# Patient Record
Sex: Female | Born: 1950 | Race: Black or African American | Hispanic: No | Marital: Married | Smoking: Never smoker
Health system: Southern US, Community
[De-identification: ages and names within clinical notes are randomized; demographics above are authoritative.]

## PROBLEM LIST (undated history)

## (undated) DIAGNOSIS — J90 Pleural effusion, not elsewhere classified: Secondary | ICD-10-CM

## (undated) DIAGNOSIS — D649 Anemia, unspecified: Secondary | ICD-10-CM

## (undated) DIAGNOSIS — I4891 Unspecified atrial fibrillation: Secondary | ICD-10-CM

## (undated) DIAGNOSIS — R532 Functional quadriplegia: Secondary | ICD-10-CM

## (undated) DIAGNOSIS — E0590 Thyrotoxicosis, unspecified without thyrotoxic crisis or storm: Secondary | ICD-10-CM

## (undated) DIAGNOSIS — I509 Heart failure, unspecified: Secondary | ICD-10-CM

## (undated) DIAGNOSIS — N289 Disorder of kidney and ureter, unspecified: Secondary | ICD-10-CM

## (undated) DIAGNOSIS — K219 Gastro-esophageal reflux disease without esophagitis: Secondary | ICD-10-CM

## (undated) DIAGNOSIS — Z9911 Dependence on respirator [ventilator] status: Secondary | ICD-10-CM

## (undated) DIAGNOSIS — I639 Cerebral infarction, unspecified: Secondary | ICD-10-CM

## (undated) DIAGNOSIS — R569 Unspecified convulsions: Secondary | ICD-10-CM

## (undated) HISTORY — PX: ABDOMINAL SURGERY: SHX537

---

## 2015-06-23 ENCOUNTER — Other Ambulatory Visit (HOSPITAL_COMMUNITY): Payer: Self-pay | Admitting: Interventional Radiology

## 2015-06-23 ENCOUNTER — Ambulatory Visit (HOSPITAL_COMMUNITY)
Admission: RE | Admit: 2015-06-23 | Discharge: 2015-06-23 | Disposition: A | Discharge status: Home / Self Care | Payer: Medicare Other | Source: Ambulatory Visit | Attending: Interventional Radiology | Admitting: Interventional Radiology

## 2015-06-23 DIAGNOSIS — R52 Pain, unspecified: Secondary | ICD-10-CM

## 2015-06-23 DIAGNOSIS — Z436 Encounter for attention to other artificial openings of urinary tract: Secondary | ICD-10-CM | POA: Insufficient documentation

## 2015-06-23 MED ORDER — IOHEXOL 300 MG/ML  SOLN
50.0000 mL | Freq: Once | INTRAMUSCULAR | Status: AC | PRN
  Administered 2015-06-23: 15 mL via INTRAVENOUS

## 2015-06-23 MED ORDER — LIDOCAINE HCL 1 % IJ SOLN
INTRAMUSCULAR | Status: DC
Start: 2015-06-23 — End: 2015-06-24
  Filled 2015-06-23: qty 20

## 2015-06-23 MED ORDER — MORPHINE SULFATE (PF) 4 MG/ML IV SOLN
2.0000 mg | Freq: Once | INTRAVENOUS | Status: AC
  Administered 2015-06-23: 2 mg via INTRAMUSCULAR

## 2015-06-23 MED ORDER — MORPHINE SULFATE (PF) 4 MG/ML IV SOLN
INTRAVENOUS | Status: AC
  Filled 2015-06-23: qty 1

## 2015-06-23 NOTE — Progress Notes (Signed)
rec'd trach/vent pt from [place] from [place].  Pt transported to [facility] for procedure in IR.  Pt placed on [place] vent settings reported from [place] via [facility].  SIMV/PS 450 vt, 12 ps, 5 peep, 100% fio2 (d/t pt in procedure area), and rate placed at 10 (d/t pt rec. Sedation for procedure).  Pt appears to be tol well, sat 99-100%, VSS.

## 2015-06-23 NOTE — Sedation Documentation (Signed)
Patient is resting comfortably. 

## 2015-06-23 NOTE — Progress Notes (Signed)
Pt transferred back to [facility] by [facility]. Pt awake and responding to simple questions. In no distress.

## 2015-06-23 NOTE — Procedures (Signed)
L PCN repalcement 10 Fr No comp/EBL

## 2015-06-23 NOTE — Sedation Documentation (Signed)
Received pt from [facility] accompanied by daughter. Pt is trached to vent. Awake and alert. Non verbal but nods yes and no to simple questions. Right sided Pleurex tube intact, with straw colored drainage. GT and foley intact. Pt with large sore to buttocks. CR monitor with A-fib rate 110-120's.

## 2015-06-29 ENCOUNTER — Encounter (HOSPITAL_COMMUNITY): Payer: Self-pay

## 2015-06-29 ENCOUNTER — Emergency Department (HOSPITAL_COMMUNITY)
Admission: EM | Admit: 2015-06-29 | Discharge: 2015-06-30 | Disposition: A | Discharge status: Home / Self Care | Payer: Medicare Other | Attending: Emergency Medicine | Admitting: Emergency Medicine

## 2015-06-29 DIAGNOSIS — T83511A Infection and inflammatory reaction due to indwelling urethral catheter, initial encounter: Secondary | ICD-10-CM

## 2015-06-29 DIAGNOSIS — Y732 Prosthetic and other implants, materials and accessory gastroenterology and urology devices associated with adverse incidents: Secondary | ICD-10-CM | POA: Insufficient documentation

## 2015-06-29 DIAGNOSIS — Z8639 Personal history of other endocrine, nutritional and metabolic disease: Secondary | ICD-10-CM | POA: Diagnosis not present

## 2015-06-29 DIAGNOSIS — Z792 Long term (current) use of antibiotics: Secondary | ICD-10-CM | POA: Diagnosis not present

## 2015-06-29 DIAGNOSIS — I509 Heart failure, unspecified: Secondary | ICD-10-CM | POA: Diagnosis not present

## 2015-06-29 DIAGNOSIS — Z8709 Personal history of other diseases of the respiratory system: Secondary | ICD-10-CM | POA: Insufficient documentation

## 2015-06-29 DIAGNOSIS — R4182 Altered mental status, unspecified: Secondary | ICD-10-CM | POA: Diagnosis present

## 2015-06-29 DIAGNOSIS — Z8673 Personal history of transient ischemic attack (TIA), and cerebral infarction without residual deficits: Secondary | ICD-10-CM | POA: Insufficient documentation

## 2015-06-29 DIAGNOSIS — T83098A Other mechanical complication of other indwelling urethral catheter, initial encounter: Secondary | ICD-10-CM | POA: Diagnosis not present

## 2015-06-29 DIAGNOSIS — Z862 Personal history of diseases of the blood and blood-forming organs and certain disorders involving the immune mechanism: Secondary | ICD-10-CM | POA: Insufficient documentation

## 2015-06-29 DIAGNOSIS — Z8719 Personal history of other diseases of the digestive system: Secondary | ICD-10-CM | POA: Insufficient documentation

## 2015-06-29 DIAGNOSIS — N390 Urinary tract infection, site not specified: Secondary | ICD-10-CM | POA: Diagnosis not present

## 2015-06-29 HISTORY — DX: Cerebral infarction, unspecified: I63.9

## 2015-06-29 HISTORY — DX: Unspecified atrial fibrillation: I48.91

## 2015-06-29 HISTORY — DX: Heart failure, unspecified: I50.9

## 2015-06-29 HISTORY — DX: Thyrotoxicosis, unspecified without thyrotoxic crisis or storm: E05.90

## 2015-06-29 HISTORY — DX: Unspecified convulsions: R56.9

## 2015-06-29 HISTORY — DX: Functional quadriplegia: R53.2

## 2015-06-29 HISTORY — DX: Dependence on respirator (ventilator) status: Z99.11

## 2015-06-29 HISTORY — DX: Pleural effusion, not elsewhere classified: J90

## 2015-06-29 HISTORY — DX: Disorder of kidney and ureter, unspecified: N28.9

## 2015-06-29 HISTORY — DX: Anemia, unspecified: D64.9

## 2015-06-29 HISTORY — DX: Gastro-esophageal reflux disease without esophagitis: K21.9

## 2015-06-29 NOTE — ED Provider Notes (Signed)
CSN: [id number]     Arrival date & time 06/29/15  2324 History  By signing my name below, I, Jacqueline Fuentes, attest that this documentation has been prepared under the direction and in the presence of Forde Dandy, MD. Electronically Signed: Julien Fuentes, ED Scribe. 06/29/2015. 11:46 PM.     Chief Complaint  Patient presents with  . Altered Mental Status   LEVEL 5 CAVEAT    The history is provided by the EMS personnel. No language interpreter was used.   HPI Comments: Jacqueline Fuentes is a 64 y.o. female who has a PMHx of chronic respiratory failure s/p tracheostomy and chronic ventilator dependence, chronic kidney disease, multiple prior CVAs with functional quadriplegia, Afib, pleural effusion w/ pleurX tube, chronic diastolic heart failure, HTN, non-operable intramural hematoma of ascending aorta, left nephrostomy tube for chronic left hydronephrosis, and chronic indwelling foley catheter, who  was brought in by EMS presents to the Emergency Department complaining of AMS. Per EMS personnel, pt was at [name] when they were called out for pt being unresponsive and her pressure being in the 80's. Upon EMS arrival, pt was responsive and her pressures were in the 180's. On paperwork, pt is full code as of June 22, 2015.   Further history is obtained from [facility], who states that patient was found to be staring off, and not responsive to verbal or tactile stimulation. The nurse had checked blood pressure once and it was noted to have a systolic blood pressure of 80. She subsequently called EMS, on their arrival, she was more hypertensive.  The patient's family who later comes to bedside state that she often has these staring spells especially after receiving Vicodin or benzodiazepines. They state that she has only been at this facility for one week. States that prior to this, she has had episodes of similar behavior, and they felt that she did not require to come to the emergency department  for this.   Past Medical History  Diagnosis Date  . Ventilator dependent ([place])   . Atrial fibrillation ([place])   . Seizures ([place])   . Pleural effusion   . Renal disorder   . Stroke ([place])   . Quadriplegia, functional ([place])   . CHF (congestive heart failure) ([place])   . Hyperthyroidism   . Anemia   . GERD (gastroesophageal reflux disease)    Past Surgical History  Procedure Laterality Date  . Abdominal surgery     No family history on file. Social History  Substance Use Topics  . Smoking status: Unknown If Ever Smoked  . Smokeless tobacco: None  . Alcohol Use: No   OB History    No data available     Review of Systems  All other systems reviewed and are negative. LEVEL 5 CAVEAT    Allergies  Aspirin; Heparin; and Lorazepam  Home Medications   Prior to Admission medications   Medication Sig Start Date End Date Taking? Authorizing Provider  ciprofloxacin (CIPRO) 500 MG tablet Take 1 tablet (500 mg total) by mouth every 12 (twelve) hours. 06/30/15   Forde Dandy, MD   Triage vitals: Pulse 110  Temp(Src) 98.5 F (36.9 C)  Resp 27  Ht 5\' 8"  (1.727 m)  Wt 275 lb (124.739 kg)  BMI 41.82 kg/m2  SpO2 100% Physical Exam Physical Exam  Nursing note and vitals reviewed. Constitutional: chronically ill appearing, in no acute distress Head: Normocephalic and atraumatic.  Mouth/Throat: Oropharynx is clear and moist.  Neck: Normal range of  motion. Neck supple. Tracheostomy in place Cardiovascular: Normal rate and regular rhythm.   Pulmonary/Chest: Effort normal and ventilator assisted breath sounds. right pleurX catheter in right axilla,  Abdominal: Soft. There is no tenderness. There is no rebound and no guarding. PEG tube in LUQ, left nephrostomy left flank,  GU: Foley catheter in place Musculoskeletal: No deformities Neurological: Awake, turns head to voice, squeezes hands bilaterally to command, wiggles toes bilaterally to command, non-verbal Skin: Skin is warm and  dry.  Psychiatric: Cooperative   ED Course  Procedures  DIAGNOSTIC STUDIES: Oxygen Saturation is 85% on RA, low by my interpretation..  Labs Review Labs Reviewed  CBC WITH DIFFERENTIAL/PLATELET - Abnormal; Notable for the following:    WBC 12.2 (*)    RBC 3.07 (*)    Hemoglobin 7.7 (*)    HCT 26.3 (*)    MCH 25.1 (*)    MCHC 29.3 (*)    RDW 16.6 (*)    Neutro Abs 10.2 (*)    All other components within normal limits  COMPREHENSIVE METABOLIC PANEL - Abnormal; Notable for the following:    Potassium 5.7 (*)    Chloride 91 (*)    CO2 39 (*)    Glucose, Bld 153 (*)    BUN 32 (*)    Albumin 2.2 (*)    Alkaline Phosphatase 136 (*)    GFR calc non Af Amer 60 (*)    All other components within normal limits  BRAIN NATRIURETIC PEPTIDE - Abnormal; Notable for the following:    B Natriuretic Peptide 402.9 (*)    All other components within normal limits  URINALYSIS, ROUTINE W REFLEX MICROSCOPIC (NOT AT [facility]) - Abnormal; Notable for the following:    APPearance TURBID (*)    Hgb urine dipstick LARGE (*)    Protein, ur 100 (*)    Leukocytes, UA LARGE (*)    All other components within normal limits  VALPROIC ACID LEVEL - Abnormal; Notable for the following:    Valproic Acid Lvl <10 (*)    All other components within normal limits  URINE MICROSCOPIC-ADD ON - Abnormal; Notable for the following:    Squamous Epithelial / LPF 0-5 (*)    Bacteria, UA MANY (*)    All other components within normal limits  URINE CULTURE  MAGNESIUM  PHOSPHORUS  I-STAT CG4 LACTIC ACID, ED  I-STAT TROPOININ, ED  I-STAT CG4 LACTIC ACID, ED    Imaging Review Ct Head Wo Contrast  06/30/2015  CLINICAL DATA:  64 year old female with altered mental status and loss of consciousness. EXAM: CT HEAD WITHOUT CONTRAST TECHNIQUE: Contiguous axial images were obtained from the base of the skull through the vertex without intravenous contrast. COMPARISON:  None. FINDINGS: The ventricles are dilated and the sulci  are prominent compatible with age-related atrophy. Periventricular and deep white matter hypodensities represent chronic microvascular ischemic changes. Bold right occipital infarct and encephalomalacia. There is a focal area of encephalomalacia involving the left parietal convexity compatible with an old infarct. Small bold Set S Set bilateral basal ganglia lacunar infarcts noted. There is no intracranial hemorrhage. No mass effect or midline shift identified. There is near complete opacification of the right mastoid air cells with partial opacification of the left mastoid air cells. There is mucoperiosteal thickening of the paranasal sinuses and partial opacification of the sphenoid sinuses. There is opacification of the nasopharynx. The calvarium is intact. IMPRESSION: No acute intracranial hemorrhage. Age-related atrophy and chronic microvascular ischemic disease. Old right cerebellar infarct. If  symptoms persist and there are no contraindications, MRI may provide better evaluation if clinically indicated. Electronically Signed   By: Anner Crete M.D.   On: 06/30/2015 02:01   Dg Chest Portable 1 View  06/30/2015  CLINICAL DATA:  64 year old female with loss of consciousness and altered mental status. EXAM: PORTABLE CHEST 1 VIEW COMPARISON:  None. FINDINGS: Stop tracheostomy tube with tip above the carina. Single portable view of the chest demonstrate enlarged cardiac silhouette with enlargement of the mediastinum. Although these findings may be partly related to patient positioning or paramediastinal pulmonary disease, underlying mediastinal or vascular injury is not excluded. Clinical correlation is recommended. CT may provide better evaluation if clinically indicated. There is a small right pleural effusion with mild compressive atelectasis of the right lung. The osseous structures are grossly unremarkable. IMPRESSION: Enlarged mediastinal silhouette may be partly related to patient positioning or  paramediastinal pulmonary opacities. Mediastinal or vascular injury is not excluded. Clinical correlation is recommended. Repeat radiograph with better positioning of the patient, if possible, or CT is recommended for further evaluation. Small right pleural effusion.  No pneumothorax. Electronically Signed   By: Anner Crete M.D.   On: 06/30/2015 00:56   I have personally reviewed and evaluated these images and lab results as part of my medical decision-making.   EKG Interpretation   Date/Time:  Thursday June 29 2015 23:35:09 EST Ventricular Rate:  116 PR Interval:  162 QRS Duration: 92 QT Interval:  327 QTC Calculation: 454 R Axis:   105 Text Interpretation:  Sinus tachycardia Probable left atrial enlargement  Right axis deviation no prior ekg for comparison Confirmed by Wilbur Oakland MD, Rudell Ortman  [id number]) on 06/30/2015 2:13:47 AM      MDM   Final diagnoses:  Urinary tract infection associated with catheterization of urinary tract, initial encounter    On arrival, she is chronically ill-appearing woman, who is currently at her reported mental status baseline. She is afebrile and hemodynamically stable. Remains normal to hypertensive while here in the emergency department without any evidence of hypotension. She has a normal lactate, mild leukocytosis of 12. A chest x-ray without acute cardiopulmonary processes, but she does have widened mediastinum that is consistent with her known thoracic aneurysm. She has anemia of 7.7, but when compared to prior blood work performed at [facility] earlier this year, her baseline ranges between 7.7-8. CT head shows no acute intracranial processes. No major electrolyte or metabolic derangements noted. UA suggestive of possible infection. Sent for culture and treated with ceftriaxone and course of keflex. Able to be treated at [facility] where she is presenting from, given that she is at her baseline according to family. Observed in the ED for several hours, with  stable mental status and vital signs. Family comfortable with plan to return to [facility]. Strict return and follow-up instructions reviewed. Family  expressed understanding of all discharge instructions and felt comfortable with the plan of care.    I personally performed the services described in this documentation, which was scribed in my presence. The recorded information has been reviewed and is accurate.   Forde Dandy, MD 06/30/15 [phone]

## 2015-06-29 NOTE — ED Notes (Signed)
Pt comes via [facility] from [place], called out for AMS, staff reported pt was unresponsive and pressures in 80's, upon EMS arrival pt was responsive and pressures 180's. Pt is a quadriplegic and on vent with trac.

## 2015-06-30 ENCOUNTER — Emergency Department (HOSPITAL_COMMUNITY): Payer: Medicare Other

## 2015-06-30 DIAGNOSIS — T83098A Other mechanical complication of other indwelling urethral catheter, initial encounter: Secondary | ICD-10-CM | POA: Diagnosis not present

## 2015-06-30 LAB — I-STAT CG4 LACTIC ACID, ED: Lactic Acid, Venous: 1 mmol/L (ref 0.5–2.0)

## 2015-06-30 LAB — CBC WITH DIFFERENTIAL/PLATELET
Basophils Absolute: 0 10*3/uL (ref 0.0–0.1)
Basophils Relative: 0 %
EOS ABS: 0 10*3/uL (ref 0.0–0.7)
EOS PCT: 0 %
HCT: 26.3 % — ABNORMAL LOW (ref 36.0–46.0)
HEMOGLOBIN: 7.7 g/dL — AB (ref 12.0–15.0)
LYMPHS ABS: 1 10*3/uL (ref 0.7–4.0)
Lymphocytes Relative: 8 %
MCH: 25.1 pg — AB (ref 26.0–34.0)
MCHC: 29.3 g/dL — AB (ref 30.0–36.0)
MCV: 85.7 fL (ref 78.0–100.0)
MONO ABS: 1 10*3/uL (ref 0.1–1.0)
MONOS PCT: 8 %
NEUTROS PCT: 84 %
Neutro Abs: 10.2 10*3/uL — ABNORMAL HIGH (ref 1.7–7.7)
Platelets: 190 10*3/uL (ref 150–400)
RBC: 3.07 MIL/uL — ABNORMAL LOW (ref 3.87–5.11)
RDW: 16.6 % — AB (ref 11.5–15.5)
WBC: 12.2 10*3/uL — ABNORMAL HIGH (ref 4.0–10.5)

## 2015-06-30 LAB — BRAIN NATRIURETIC PEPTIDE: B NATRIURETIC PEPTIDE 5: 402.9 pg/mL — AB (ref 0.0–100.0)

## 2015-06-30 LAB — URINALYSIS, ROUTINE W REFLEX MICROSCOPIC
Bilirubin Urine: NEGATIVE
Glucose, UA: NEGATIVE mg/dL
Ketones, ur: NEGATIVE mg/dL
Nitrite: NEGATIVE
Protein, ur: 100 mg/dL — AB
Specific Gravity, Urine: 1.016 (ref 1.005–1.030)
pH: 6 (ref 5.0–8.0)

## 2015-06-30 LAB — URINE MICROSCOPIC-ADD ON

## 2015-06-30 LAB — COMPREHENSIVE METABOLIC PANEL
ALT: 25 U/L (ref 14–54)
AST: 22 U/L (ref 15–41)
Albumin: 2.2 g/dL — ABNORMAL LOW (ref 3.5–5.0)
Alkaline Phosphatase: 136 U/L — ABNORMAL HIGH (ref 38–126)
Anion gap: 7 (ref 5–15)
BUN: 32 mg/dL — ABNORMAL HIGH (ref 6–20)
CO2: 39 mmol/L — ABNORMAL HIGH (ref 22–32)
Calcium: 9.1 mg/dL (ref 8.9–10.3)
Chloride: 91 mmol/L — ABNORMAL LOW (ref 101–111)
Creatinine, Ser: 0.97 mg/dL (ref 0.44–1.00)
GFR calc Af Amer: >60 mL/min (ref >60)
GFR calc non Af Amer: 60 mL/min — ABNORMAL LOW (ref >60)
Glucose, Bld: 153 mg/dL — ABNORMAL HIGH (ref 65–99)
Potassium: 5.7 mmol/L — ABNORMAL HIGH (ref 3.5–5.1)
Sodium: 137 mmol/L (ref 135–145)
Total Bilirubin: 0.4 mg/dL (ref 0.3–1.2)
Total Protein: 8 g/dL (ref 6.5–8.1)

## 2015-06-30 LAB — I-STAT TROPONIN, ED: Troponin i, poc: 0.01 ng/mL (ref 0.00–0.08)

## 2015-06-30 LAB — VALPROIC ACID LEVEL: Valproic Acid Lvl: <10 ug/mL — ABNORMAL LOW (ref 50.0–100.0)

## 2015-06-30 LAB — PHOSPHORUS: Phosphorus: 2.5 mg/dL (ref 2.5–4.6)

## 2015-06-30 LAB — MAGNESIUM: Magnesium: 2.4 mg/dL (ref 1.7–2.4)

## 2015-06-30 MED ORDER — CIPROFLOXACIN HCL 500 MG PO TABS: 500.0000 mg | ORAL_TABLET | Freq: Two times a day (BID) | ORAL | Status: DC

## 2015-06-30 MED ORDER — DEXTROSE 5 % IV SOLN
1.0000 g | Freq: Once | INTRAVENOUS | Status: AC
  Administered 2015-06-30: 1 g via INTRAVENOUS
  Filled 2015-06-30: qty 10

## 2015-06-30 NOTE — ED Notes (Signed)
Pt unable to sign e signature due to pt being quadriplegic

## 2015-06-30 NOTE — Progress Notes (Signed)
RT called to assist [facility] with preparing patient to be transported back to [facility]. Pt removed from our vent and placed on [facility] transport vent. Pt comfortable at time of departure.

## 2015-06-30 NOTE — Consult Note (Signed)
   Name: Jacqueline Fuentes MRN: [id number] DOB: 1951/01/25    Initially called to bedside to assess patient for possible need for admission. EGD provider speaking with family at bedside. Reportedly patient at baseline and family desires her transfer [facility] to [facility] for ongoing treatment for her urinary tract infection. Requested they reconsult if we could be of any further assistance in her care.  Sonia Baller Ashok Cordia, M.D. [facility] Pager:  [phone] After 3pm or if no response, call [phone] 06/30/2015, 4:21 AM

## 2015-06-30 NOTE — Discharge Instructions (Signed)
Return without fail for worsening symptoms, including fever, persistent confusion, or any other symptoms concerning to you.   Urinary Tract Infection Urinary tract infections (UTIs) can develop anywhere along your urinary tract. Your urinary tract is your body's drainage system for removing wastes and extra water. Your urinary tract includes two kidneys, two ureters, a bladder, and a urethra. Your kidneys are a pair of bean-shaped organs. Each kidney is about the size of your fist. They are located below your ribs, one on each side of your spine. CAUSES Infections are caused by microbes, which are microscopic organisms, including fungi, viruses, and bacteria. These organisms are so small that they can only be seen through a microscope. Bacteria are the microbes that most commonly cause UTIs. SYMPTOMS  Symptoms of UTIs may vary by age and gender of the patient and by the location of the infection. Symptoms in young women typically include a frequent and intense urge to urinate and a painful, burning feeling in the bladder or urethra during urination. Older women and men are more likely to be tired, shaky, and weak and have muscle aches and abdominal pain. A fever may mean the infection is in your kidneys. Other symptoms of a kidney infection include pain in your back or sides below the ribs, nausea, and vomiting. DIAGNOSIS To diagnose a UTI, your caregiver will ask you about your symptoms. Your caregiver will also ask you to provide a urine sample. The urine sample will be tested for bacteria and white blood cells. White blood cells are made by your body to help fight infection. TREATMENT  Typically, UTIs can be treated with medication. Because most UTIs are caused by a bacterial infection, they usually can be treated with the use of antibiotics. The choice of antibiotic and length of treatment depend on your symptoms and the type of bacteria causing your infection. HOME CARE INSTRUCTIONS  If you were  prescribed antibiotics, take them exactly as your caregiver instructs you. Finish the medication even if you feel better after you have only taken some of the medication.  Drink enough water and fluids to keep your urine clear or pale yellow.  Avoid caffeine, tea, and carbonated beverages. They tend to irritate your bladder.  Empty your bladder often. Avoid holding urine for long periods of time.  Empty your bladder before and after sexual intercourse.  After a bowel movement, women should cleanse from front to back. Use each tissue only once. SEEK MEDICAL CARE IF:   You have back pain.  You develop a fever.  Your symptoms do not begin to resolve within 3 days. SEEK IMMEDIATE MEDICAL CARE IF:   You have severe back pain or lower abdominal pain.  You develop chills.  You have nausea or vomiting.  You have continued burning or discomfort with urination. MAKE SURE YOU:   Understand these instructions.  Will watch your condition.  Will get help right away if you are not doing well or get worse.   This information is not intended to replace advice given to you by your health care provider. Make sure you discuss any questions you have with your health care provider.   Document Released: 04/03/2005 Document Revised: 03/15/2015 Document Reviewed: 08/02/2011 Elsevier Interactive Patient Education 2016 [place] Urinary Tract Infection FAQs What is "catheter-associated urinary tract infection"? A urinary tract infection (also called "UTI") is an infection in the urinary system, which includes the bladder (which stores the urine) and the kidneys (which filter the  blood to make urine). Germs (for example, bacteria or yeasts) do not normally live in these areas; but if germs are introduced, an infection can occur. If you have a urinary catheter, germs can travel along the catheter and cause an infection in your bladder or your kidney; in that case it is  called a catheter-associated urinary tract infection (or "CA-UTI").  What is a urinary catheter? A urinary catheter is a thin tube placed in the bladder to drain urine. Urine drains through the tube into a bag that collects the urine. A urinary catheter may be used:  If you are not able to urinate on your own  To measure the amount of urine that you make, for example, during intensive care  During and after some types of surgery  During some tests of the kidneys and bladder People with urinary catheters have a much higher chance of getting a urinary tract infection than people who don't have a catheter. How do I get a catheter-associated urinary tract infection (CA-UTI)? If germs enter the urinary tract, they may cause an infection. Many of the germs that cause a catheter-associated urinary tract infection are common germs found in your intestines that do not usually cause an infection there. Germs can enter the urinary tract when the catheter is being put in or while the catheter remains in the bladder.  What are the symptoms of a urinary tract infection? Some of the common symptoms of a urinary tract infection are:  Burning or pain in the lower abdomen (that is, below the stomach)  Fever  Bloody urine may be a sign of infection, but is also caused by other problems  Burning during urination or an increase in the frequency of urination after the catheter is removed. Sometimes people with catheter-associated urinary tract infections do not have these symptoms of infection. Can catheter-associated urinary tract infections be treated? Yes, most catheter-associated urinary tract infections can be treated with antibiotics and removal or change of the catheter. Your doctor will determine which antibiotic is best for you.  What are some of the things that hospitals are doing to prevent catheter-associated urinary tract infections? To prevent urinary tract infections, doctors and nurses take the  following actions.  Catheter insertion  External catheters in men (these look like condoms and are placed over the penis rather than into the penis)  Putting a temporary catheter in to drain the urine and removing it right away. This is called intermittent urethral catheterization. Catheter care What can I do to help prevent catheter-associated urinary tract infections if I have a catheter?  Always clean your hands before and after doing catheter care.  Always keep your urine bag below the level of your bladder.  Do not tug or pull on the tubing.  Do not twist or kink the catheter tubing.  Ask your healthcare provider each day if you still need the catheter. What do I need to do when I go home from the hospital?  If you will be going home with a catheter, your doctor or nurse should explain everything you need to know about taking care of the catheter. Make sure you understand how to care for it before you leave the hospital.  If you develop any of the symptoms of a urinary tract infection, such as burning or pain in the lower abdomen, fever, or an increase in the frequency of urination, contact your doctor or nurse immediately.  Before you go home, make sure you know who to contact  if you have questions or problems after you get home. If you have questions, please ask your doctor or nurse. Developed and co-sponsored by [employer] for [place] [phone]); Infectious Diseases Society of [place] (IDSA); [place]; Association for Professionals in Infection Control and Epidemiology (APIC); Centers for Disease Control and Prevention (CDC); and [employer].   This information is not intended to replace advice given to you by your health care provider. Make sure you discuss any questions you have with your health care provider.   Document Released: 03/18/2012 Document Revised: 11/08/2014 Document Reviewed: 09/07/2014 Elsevier Interactive  Patient Education [employer].

## 2015-06-30 NOTE — Progress Notes (Signed)
RT Note: Pt arrives via EMS from [place]. She is a chronic trach, vent dependent. Pt has a #7 Portex trach. Pt placed on our vent with [facility] settings. VT: 500, RR: 12, PEEP: 5, FIO2: 40%. Trach cleaned, trach ties changed. Pt deep suctioned with moderate, thick, yellow/green secretions removed. Pt vitals: HR: 99, RR: 23, SPO2: 100%. RT will continue to monitor.

## 2015-06-30 NOTE — Progress Notes (Signed)
Pt transported to CT without event. 

## 2015-07-04 ENCOUNTER — Telehealth (HOSPITAL_COMMUNITY): Payer: Self-pay

## 2015-07-04 LAB — URINE CULTURE

## 2015-07-04 NOTE — Telephone Encounter (Signed)
Lab calling w/critical.  Informed lab pt has been transported back to [place]

## 2015-07-05 ENCOUNTER — Telehealth (HOSPITAL_COMMUNITY): Payer: Self-pay

## 2015-08-11 ENCOUNTER — Ambulatory Visit (HOSPITAL_COMMUNITY)
Admission: RE | Admit: 2015-08-11 | Discharge: 2015-08-11 | Disposition: A | Discharge status: Home / Self Care | Payer: Medicare Other | Source: Ambulatory Visit | Attending: Internal Medicine | Admitting: Internal Medicine

## 2015-08-11 ENCOUNTER — Other Ambulatory Visit (HOSPITAL_COMMUNITY): Payer: Self-pay | Admitting: Internal Medicine

## 2015-08-11 DIAGNOSIS — I639 Cerebral infarction, unspecified: Secondary | ICD-10-CM

## 2015-08-11 DIAGNOSIS — I878 Other specified disorders of veins: Secondary | ICD-10-CM | POA: Insufficient documentation

## 2015-08-11 MED ORDER — LIDOCAINE HCL 1 % IJ SOLN
INTRAMUSCULAR | Status: AC
  Filled 2015-08-11: qty 20

## 2015-08-11 NOTE — Procedures (Signed)
Successful placement of dual lumen PICC line to right brachial vein. Length 37 cm Tip at lower SVC/RA No complications Ready for use.  Ascencion Dike PA-C 3:33 PM

## 2015-08-11 NOTE — Progress Notes (Signed)
RT Note: Pt brought from [facility] & placed on our vent for IR procedure. Pt tolerating current vent settings well. RT will continue to monitor.

## 2015-09-20 ENCOUNTER — Emergency Department (HOSPITAL_COMMUNITY): Payer: Medicare Other

## 2015-09-20 ENCOUNTER — Encounter (HOSPITAL_COMMUNITY): Payer: Self-pay | Admitting: *Deleted

## 2015-09-20 ENCOUNTER — Inpatient Hospital Stay (HOSPITAL_COMMUNITY)
Admission: EM | Admit: 2015-09-20 | Discharge: 2015-10-10 | DRG: 870 | Disposition: A | Discharge status: Skilled Nursing Facility | Payer: Medicare Other | Attending: Internal Medicine | Admitting: Internal Medicine

## 2015-09-20 DIAGNOSIS — K219 Gastro-esophageal reflux disease without esophagitis: Secondary | ICD-10-CM | POA: Diagnosis present

## 2015-09-20 DIAGNOSIS — J9610 Chronic respiratory failure, unspecified whether with hypoxia or hypercapnia: Secondary | ICD-10-CM | POA: Diagnosis present

## 2015-09-20 DIAGNOSIS — N179 Acute kidney failure, unspecified: Secondary | ICD-10-CM | POA: Diagnosis present

## 2015-09-20 DIAGNOSIS — L89154 Pressure ulcer of sacral region, stage 4: Secondary | ICD-10-CM | POA: Diagnosis present

## 2015-09-20 DIAGNOSIS — F411 Generalized anxiety disorder: Secondary | ICD-10-CM | POA: Diagnosis present

## 2015-09-20 DIAGNOSIS — L89619 Pressure ulcer of right heel, unspecified stage: Secondary | ICD-10-CM | POA: Diagnosis present

## 2015-09-20 DIAGNOSIS — D62 Acute posthemorrhagic anemia: Secondary | ICD-10-CM | POA: Diagnosis present

## 2015-09-20 DIAGNOSIS — A419 Sepsis, unspecified organism: Secondary | ICD-10-CM | POA: Diagnosis present

## 2015-09-20 DIAGNOSIS — G8929 Other chronic pain: Secondary | ICD-10-CM | POA: Diagnosis present

## 2015-09-20 DIAGNOSIS — J189 Pneumonia, unspecified organism: Secondary | ICD-10-CM

## 2015-09-20 DIAGNOSIS — J9690 Respiratory failure, unspecified, unspecified whether with hypoxia or hypercapnia: Secondary | ICD-10-CM

## 2015-09-20 DIAGNOSIS — I482 Chronic atrial fibrillation, unspecified: Secondary | ICD-10-CM | POA: Diagnosis present

## 2015-09-20 DIAGNOSIS — J90 Pleural effusion, not elsewhere classified: Secondary | ICD-10-CM | POA: Diagnosis present

## 2015-09-20 DIAGNOSIS — N1330 Unspecified hydronephrosis: Secondary | ICD-10-CM | POA: Diagnosis present

## 2015-09-20 DIAGNOSIS — E0590 Thyrotoxicosis, unspecified without thyrotoxic crisis or storm: Secondary | ICD-10-CM | POA: Diagnosis present

## 2015-09-20 DIAGNOSIS — L89891 Pressure ulcer of other site, stage 1: Secondary | ICD-10-CM | POA: Diagnosis present

## 2015-09-20 DIAGNOSIS — I5033 Acute on chronic diastolic (congestive) heart failure: Secondary | ICD-10-CM | POA: Diagnosis present

## 2015-09-20 DIAGNOSIS — Z8673 Personal history of transient ischemic attack (TIA), and cerebral infarction without residual deficits: Secondary | ICD-10-CM

## 2015-09-20 DIAGNOSIS — R579 Shock, unspecified: Secondary | ICD-10-CM | POA: Diagnosis present

## 2015-09-20 DIAGNOSIS — E870 Hyperosmolality and hypernatremia: Secondary | ICD-10-CM | POA: Diagnosis present

## 2015-09-20 DIAGNOSIS — Z886 Allergy status to analgesic agent status: Secondary | ICD-10-CM

## 2015-09-20 DIAGNOSIS — J15212 Pneumonia due to Methicillin resistant Staphylococcus aureus: Secondary | ICD-10-CM | POA: Diagnosis present

## 2015-09-20 DIAGNOSIS — I719 Aortic aneurysm of unspecified site, without rupture: Secondary | ICD-10-CM

## 2015-09-20 DIAGNOSIS — R569 Unspecified convulsions: Secondary | ICD-10-CM | POA: Diagnosis present

## 2015-09-20 DIAGNOSIS — I7101 Dissection of thoracic aorta: Secondary | ICD-10-CM | POA: Diagnosis present

## 2015-09-20 DIAGNOSIS — D638 Anemia in other chronic diseases classified elsewhere: Secondary | ICD-10-CM | POA: Diagnosis present

## 2015-09-20 DIAGNOSIS — Z9911 Dependence on respirator [ventilator] status: Secondary | ICD-10-CM | POA: Diagnosis present

## 2015-09-20 DIAGNOSIS — F419 Anxiety disorder, unspecified: Secondary | ICD-10-CM | POA: Diagnosis present

## 2015-09-20 DIAGNOSIS — F329 Major depressive disorder, single episode, unspecified: Secondary | ICD-10-CM | POA: Diagnosis present

## 2015-09-20 DIAGNOSIS — E43 Unspecified severe protein-calorie malnutrition: Secondary | ICD-10-CM | POA: Diagnosis present

## 2015-09-20 DIAGNOSIS — N189 Chronic kidney disease, unspecified: Secondary | ICD-10-CM | POA: Diagnosis present

## 2015-09-20 DIAGNOSIS — Z430 Encounter for attention to tracheostomy: Secondary | ICD-10-CM

## 2015-09-20 DIAGNOSIS — Z7401 Bed confinement status: Secondary | ICD-10-CM

## 2015-09-20 DIAGNOSIS — L8990 Pressure ulcer of unspecified site, unspecified stage: Secondary | ICD-10-CM | POA: Diagnosis present

## 2015-09-20 DIAGNOSIS — R319 Hematuria, unspecified: Secondary | ICD-10-CM | POA: Diagnosis present

## 2015-09-20 DIAGNOSIS — R532 Functional quadriplegia: Secondary | ICD-10-CM | POA: Diagnosis present

## 2015-09-20 DIAGNOSIS — D649 Anemia, unspecified: Secondary | ICD-10-CM

## 2015-09-20 DIAGNOSIS — Z888 Allergy status to other drugs, medicaments and biological substances status: Secondary | ICD-10-CM

## 2015-09-20 DIAGNOSIS — G40909 Epilepsy, unspecified, not intractable, without status epilepticus: Secondary | ICD-10-CM | POA: Diagnosis present

## 2015-09-20 DIAGNOSIS — J9611 Chronic respiratory failure with hypoxia: Secondary | ICD-10-CM | POA: Diagnosis present

## 2015-09-20 DIAGNOSIS — J9859 Other diseases of mediastinum, not elsewhere classified: Secondary | ICD-10-CM

## 2015-09-20 DIAGNOSIS — Z936 Other artificial openings of urinary tract status: Secondary | ICD-10-CM

## 2015-09-20 DIAGNOSIS — F32A Depression, unspecified: Secondary | ICD-10-CM | POA: Diagnosis present

## 2015-09-20 DIAGNOSIS — M869 Osteomyelitis, unspecified: Secondary | ICD-10-CM

## 2015-09-20 DIAGNOSIS — E669 Obesity, unspecified: Secondary | ICD-10-CM | POA: Diagnosis present

## 2015-09-20 DIAGNOSIS — Z6827 Body mass index (BMI) 27.0-27.9, adult: Secondary | ICD-10-CM

## 2015-09-20 DIAGNOSIS — E876 Hypokalemia: Secondary | ICD-10-CM | POA: Diagnosis present

## 2015-09-20 DIAGNOSIS — L89159 Pressure ulcer of sacral region, unspecified stage: Secondary | ICD-10-CM | POA: Diagnosis present

## 2015-09-20 DIAGNOSIS — R9389 Abnormal findings on diagnostic imaging of other specified body structures: Secondary | ICD-10-CM

## 2015-09-20 DIAGNOSIS — M8610 Other acute osteomyelitis, unspecified site: Secondary | ICD-10-CM

## 2015-09-20 DIAGNOSIS — Z930 Tracheostomy status: Secondary | ICD-10-CM

## 2015-09-20 DIAGNOSIS — Z931 Gastrostomy status: Secondary | ICD-10-CM

## 2015-09-20 DIAGNOSIS — M4626 Osteomyelitis of vertebra, lumbar region: Secondary | ICD-10-CM | POA: Diagnosis present

## 2015-09-20 DIAGNOSIS — I10 Essential (primary) hypertension: Secondary | ICD-10-CM | POA: Diagnosis present

## 2015-09-20 LAB — CBC WITH DIFFERENTIAL/PLATELET
BASOS ABS: 0 10*3/uL (ref 0.0–0.1)
BASOS PCT: 0 %
EOS ABS: 0 10*3/uL (ref 0.0–0.7)
EOS PCT: 0 %
HCT: 16.1 % — ABNORMAL LOW (ref 36.0–46.0)
Hemoglobin: 5 g/dL — CL (ref 12.0–15.0)
Lymphocytes Relative: 7 %
Lymphs Abs: 0.9 10*3/uL (ref 0.7–4.0)
MCH: 25.4 pg — ABNORMAL LOW (ref 26.0–34.0)
MCHC: 31.1 g/dL (ref 30.0–36.0)
MCV: 81.7 fL (ref 78.0–100.0)
MONO ABS: 0.6 10*3/uL (ref 0.1–1.0)
Monocytes Relative: 5 %
Neutro Abs: 11.1 10*3/uL — ABNORMAL HIGH (ref 1.7–7.7)
Neutrophils Relative %: 88 %
PLATELETS: 136 10*3/uL — AB (ref 150–400)
RBC: 1.97 MIL/uL — ABNORMAL LOW (ref 3.87–5.11)
RDW: 18.2 % — AB (ref 11.5–15.5)
WBC: 12.6 10*3/uL — AB (ref 4.0–10.5)

## 2015-09-20 LAB — COMPREHENSIVE METABOLIC PANEL
ALBUMIN: 1.2 g/dL — AB (ref 3.5–5.0)
ALK PHOS: 103 U/L (ref 38–126)
ALT: 18 U/L (ref 14–54)
AST: 28 U/L (ref 15–41)
Anion gap: 14 (ref 5–15)
BILIRUBIN TOTAL: 0.4 mg/dL (ref 0.3–1.2)
BUN: 238 mg/dL — AB (ref 6–20)
CALCIUM: 7.8 mg/dL — AB (ref 8.9–10.3)
CO2: 30 mmol/L (ref 22–32)
CREATININE: 4.63 mg/dL — AB (ref 0.44–1.00)
Chloride: 95 mmol/L — ABNORMAL LOW (ref 101–111)
GFR calc Af Amer: 11 mL/min — ABNORMAL LOW (ref >60)
GFR, EST NON AFRICAN AMERICAN: 9 mL/min — AB (ref >60)
GLUCOSE: 131 mg/dL — AB (ref 65–99)
Potassium: 4.1 mmol/L (ref 3.5–5.1)
Sodium: 139 mmol/L (ref 135–145)
TOTAL PROTEIN: 6.6 g/dL (ref 6.5–8.1)

## 2015-09-20 LAB — I-STAT VENOUS BLOOD GAS, ED
ACID-BASE EXCESS: 9 mmol/L — AB (ref 0.0–2.0)
Bicarbonate: 33.1 mEq/L — ABNORMAL HIGH (ref 20.0–24.0)
O2 SAT: 71 %
PCO2 VEN: 43 mmHg — AB (ref 45.0–50.0)
TCO2: 34 mmol/L (ref 0–100)
pH, Ven: 7.495 — ABNORMAL HIGH (ref 7.250–7.300)
pO2, Ven: 34 mmHg (ref 31.0–45.0)

## 2015-09-20 LAB — I-STAT TROPONIN, ED: TROPONIN I, POC: 0.04 ng/mL (ref 0.00–0.08)

## 2015-09-20 LAB — BRAIN NATRIURETIC PEPTIDE: B NATRIURETIC PEPTIDE 5: 249.6 pg/mL — AB (ref 0.0–100.0)

## 2015-09-20 LAB — LIPASE, BLOOD: LIPASE: 21 U/L (ref 11–51)

## 2015-09-20 LAB — I-STAT CG4 LACTIC ACID, ED: Lactic Acid, Venous: 1.95 mmol/L (ref 0.5–2.0)

## 2015-09-20 MED ORDER — ACETAMINOPHEN 500 MG PO TABS
1000.0000 mg | ORAL_TABLET | Freq: Once | ORAL | Status: AC
  Administered 2015-09-21: 1000 mg via ORAL
  Filled 2015-09-20: qty 2

## 2015-09-20 MED ORDER — SODIUM CHLORIDE 0.9 % IV SOLN: 10.0000 mL/h | Freq: Once | INTRAVENOUS | Status: DC

## 2015-09-20 MED ORDER — VANCOMYCIN HCL 10 G IV SOLR
2000.0000 mg | Freq: Once | INTRAVENOUS | Status: AC
  Administered 2015-09-21: 2000 mg via INTRAVENOUS
  Filled 2015-09-20: qty 2000

## 2015-09-20 MED ORDER — VANCOMYCIN HCL IN DEXTROSE 1-5 GM/200ML-% IV SOLN: 1000.0000 mg | Freq: Once | INTRAVENOUS | Status: DC

## 2015-09-20 MED ORDER — PIPERACILLIN-TAZOBACTAM 3.375 G IVPB 30 MIN
3.3750 g | Freq: Once | INTRAVENOUS | Status: AC
  Administered 2015-09-20: 3.375 g via INTRAVENOUS
  Filled 2015-09-20: qty 50

## 2015-09-20 MED ORDER — SODIUM CHLORIDE 0.9 % IV BOLUS (SEPSIS)
1000.0000 mL | Freq: Once | INTRAVENOUS | Status: AC
  Administered 2015-09-20: 1000 mL via INTRAVENOUS

## 2015-09-20 MED ORDER — IPRATROPIUM-ALBUTEROL 0.5-2.5 (3) MG/3ML IN SOLN
6.0000 mL | RESPIRATORY_TRACT | Status: DC
  Administered 2015-09-21: 3 mL via RESPIRATORY_TRACT
  Filled 2015-09-20: qty 3

## 2015-09-20 NOTE — ED Notes (Signed)
Pt to ED by [facility] from [place] [facility] for anemia and hypotension. Hgb 4.9 at [facility] with bp 58/44, received one unit of blood. Pt has several antigens for cross matching. Pt is vent dependent with chronic anemia. Husband requested transport. +4 BUE edema, pleura vac to R chest, nephrostomy tube to L kidney, hematuria noted in foley catheter bag

## 2015-09-20 NOTE — Progress Notes (Signed)
RT was unable to ventilate patient effectively due to high PIP. Placed patient on PCV and pt tol well. Will cont to monitor   09/20/15 2318  Vent Select  Invasive or Noninvasive Invasive  Adult Vent Y  Tracheostomy Portex 7 mm Cuffed  No Placement Date or Time found.   Inserted prior to hospital arrival?: Yes  Brand: Portex  Size (mm): 7 mm  Style: Cuffed  Status Secured  Site Assessment Clean;Dry  Ties Assessment Clean;Dry;Secure  Adult Ventilator Settings  Vent Type Servo i  Humidity HME  Vent Mode PCV  Set Rate 18 bmp  FiO2 (%) 40 %  I Time 0.9 Sec(s)  Pressure Control 25 cmH20  PEEP 5 cmH20  Adult Ventilator Measurements  Peak Airway Pressure 34 L/min  Mean Airway Pressure 16 cmH20  Plateau Pressure 22 cmH20  Resp Rate Spontaneous 6 br/min  Resp Rate Total 24 br/min  Exhaled Vt 425 mL  Measured Ve 11.9 mL  I:E Ratio Measured 1.1:1  SpO2 100 %  Adult Ventilator Alarms  Alarms On Y  Ve High Alarm 24 L/min  Ve Low Alarm 4 L/min  Resp Rate High Alarm 40 br/min  Resp Rate Low Alarm 5  PEEP Low Alarm 2 cmH2O  Press High Alarm 40 cmH2O

## 2015-09-20 NOTE — ED Provider Notes (Signed)
CSN: [id number]     Arrival date & time 09/20/15  2235 History  By signing my name below, I, Rayna Sexton, attest that this documentation has been prepared under the direction and in the presence of Everlene Balls, MD. Electronically Signed: Rayna Sexton, ED Scribe. 09/20/2015. 11:44 PM.    Chief Complaint  Patient presents with  . Anemia   The history is provided by the patient. No language interpreter was used.   LEVEL 5 Caveat: Acuity of Condition   HPI Comments: Jacqueline Fuentes is a 65 y.o. female with a PMHx of a-fib, pleural effusion, renal disorder, stroke and CHF who is ventilator dependant presents to the Emergency Department by [facility] from [facility]  due to hypotension. Per nurse, pt here due to chronic anemia noting her hemoglobin was <5 After being given 1unit of blood, patient became hypotensive.  She has a tough time with cross match for her blood.  They have also been treating her for a MRSA pneumonia.    Past Medical History  Diagnosis Date  . Ventilator dependent ([place])   . Atrial fibrillation ([place])   . Seizures ([place])   . Pleural effusion   . Renal disorder   . Stroke ([place])   . Quadriplegia, functional ([place])   . CHF (congestive heart failure) ([place])   . Hyperthyroidism   . Anemia   . GERD (gastroesophageal reflux disease)    Past Surgical History  Procedure Laterality Date  . Abdominal surgery     No family history on file. Social History  Substance Use Topics  . Smoking status: Unknown If Ever Smoked  . Smokeless tobacco: None  . Alcohol Use: No   OB History    No data available     Review of Systems  Unable to perform ROS: Acuity of condition   A complete 10 system review of systems was obtained and all systems are negative except as noted in the HPI and PMH.   Allergies  Aspirin; Heparin; and Lorazepam  Home Medications   Prior to Admission medications   Medication Sig Start Date End Date Taking? Authorizing Provider  ciprofloxacin  (CIPRO) 500 MG tablet Take 1 tablet (500 mg total) by mouth every 12 (twelve) hours. 06/30/15   Forde Dandy, MD   BP 103/58 mmHg  Pulse 89  Temp(Src) 100.7 F (38.2 C) (Oral)  Resp 28  SpO2 100%    Physical Exam  Constitutional: She is oriented to person, place, and time. She appears well-developed and well-nourished. No distress.  Trache in place; nonverbal; wheezing  HENT:  Head: Normocephalic and atraumatic.  Nose: Nose normal.  Mouth/Throat: Oropharynx is clear and moist. No oropharyngeal exudate.  Eyes: Conjunctivae and EOM are normal. Pupils are equal, round, and reactive to light. No scleral icterus.  Neck: Normal range of motion. Neck supple. No JVD present. No tracheal deviation present. No thyromegaly present.  Cardiovascular: Normal rate, regular rhythm and normal heart sounds.  Exam reveals no gallop and no friction rub.   No murmur heard. Pulmonary/Chest: Effort normal. No respiratory distress. She has wheezes. She exhibits no tenderness.  Abdominal: Soft. Bowel sounds are normal. She exhibits no distension and no mass. There is no tenderness. There is no rebound and no guarding.  PEG tube in LUQ of abd; foley and nephrostomy tubes present  Musculoskeletal: Normal range of motion. She exhibits edema. She exhibits no tenderness.  RUE picc line in place; 2+ bilateral LE edema present  Lymphadenopathy:    She  has no cervical adenopathy.  Neurological: She is alert and oriented to person, place, and time. No cranial nerve deficit. She exhibits normal muscle tone.  Skin: Skin is warm and dry. No rash noted. No erythema. No pallor.  Stage 4 decubitus ulcer at the sacrum, bone is exposed.  Nursing note and vitals reviewed.   ED Course  Procedures  DIAGNOSTIC STUDIES: Oxygen Saturation is 100% on RA, normal by my interpretation.    COORDINATION OF CARE: 11:14 PM Discussed next steps with pt and she agreed with the plan.   Labs Review Labs Reviewed  COMPREHENSIVE  METABOLIC PANEL - Abnormal; Notable for the following:    Chloride 95 (*)    Glucose, Bld 131 (*)    BUN 238 (*)    Creatinine, Ser 4.63 (*)    Calcium 7.8 (*)    Albumin 1.2 (*)    GFR calc non Af Amer 9 (*)    GFR calc Af Amer 11 (*)    All other components within normal limits  CBC WITH DIFFERENTIAL/PLATELET - Abnormal; Notable for the following:    WBC 12.6 (*)    RBC 1.97 (*)    Hemoglobin 5.0 (*)    HCT 16.1 (*)    MCH 25.4 (*)    RDW 18.2 (*)    Platelets 136 (*)    Neutro Abs 11.1 (*)    All other components within normal limits  I-STAT VENOUS BLOOD GAS, ED - Abnormal; Notable for the following:    pH, Ven 7.495 (*)    pCO2, Ven 43.0 (*)    Bicarbonate 33.1 (*)    Acid-Base Excess 9.0 (*)    All other components within normal limits  CULTURE, BLOOD (ROUTINE X 2)  CULTURE, BLOOD (ROUTINE X 2)  URINE CULTURE  LIPASE, BLOOD  URINALYSIS, ROUTINE W REFLEX MICROSCOPIC (NOT AT [facility])  BRAIN NATRIURETIC PEPTIDE  I-STAT CG4 LACTIC ACID, ED  I-STAT TROPOININ, ED  TYPE AND SCREEN  PREPARE RBC (CROSSMATCH)    Imaging Review Dg Chest Port 1 View  09/20/2015  CLINICAL DATA:  Acute onset of fever and shortness of breath. Assess for pneumonia. Low blood pressure. Initial encounter. EXAM: PORTABLE CHEST 1 VIEW COMPARISON:  Chest radiograph performed 06/30/2015 FINDINGS: The patient's tracheostomy tube is noted ending 7 cm above the carina. A right apical chest tube is unchanged in position. No definite pneumothorax is seen. A right PICC ends overlying the proximal SVC, though not well characterized due to patient rotation. The lungs are well-aerated. Vascular congestion is noted, with increased interstitial markings and bilateral opacities, which may reflect pulmonary edema or pneumonia. There is no evidence of pleural effusion or pneumothorax. The cardiomediastinal silhouette is mildly enlarged. No acute osseous abnormalities are seen. IMPRESSION: Vascular congestion and mild  cardiomegaly. Increased interstitial markings and bilateral opacities may reflect pulmonary edema or pneumonia. Evaluation is somewhat suboptimal due to patient rotation. Electronically Signed   By: Garald Balding M.D.   On: 09/20/2015 23:14   I have personally reviewed and evaluated these images and lab results as part of my medical decision-making.   EKG Interpretation   Date/Time:  Wednesday September 20 2015 22:57:46 EDT Ventricular Rate:  87 PR Interval:  150 QRS Duration: 87 QT Interval:  383 QTC Calculation: 461 R Axis:   29 Text Interpretation:  Sinus rhythm Low voltage, extremity and precordial  leads tachycardia now resolved Confirmed by Glynn Octave [phone])  on 09/20/2015 11:36:26 PM      MDM  Final diagnoses:  None    Patient presents to the ED for anemia.  She also has a fever here and a CXR positive for pna.  Per records, this is MRSA pneumonia.  Code sepsis called.  She is currently HD stable.  Will contact ICU for admission.  I spoke with ICU for admission and they have accepted.  Broad spectrum antibiotics have been given.     ,I personally performed the services described in this documentation, which was scribed in my presence. The recorded information has been reviewed and is accurate.     Everlene Balls, MD 09/21/15 1517

## 2015-09-21 ENCOUNTER — Inpatient Hospital Stay (HOSPITAL_COMMUNITY): Payer: Medicare Other

## 2015-09-21 DIAGNOSIS — L89619 Pressure ulcer of right heel, unspecified stage: Secondary | ICD-10-CM | POA: Diagnosis present

## 2015-09-21 DIAGNOSIS — J189 Pneumonia, unspecified organism: Secondary | ICD-10-CM | POA: Diagnosis not present

## 2015-09-21 DIAGNOSIS — J9610 Chronic respiratory failure, unspecified whether with hypoxia or hypercapnia: Secondary | ICD-10-CM | POA: Diagnosis present

## 2015-09-21 DIAGNOSIS — E43 Unspecified severe protein-calorie malnutrition: Secondary | ICD-10-CM | POA: Diagnosis present

## 2015-09-21 DIAGNOSIS — R579 Shock, unspecified: Secondary | ICD-10-CM | POA: Diagnosis present

## 2015-09-21 DIAGNOSIS — D638 Anemia in other chronic diseases classified elsewhere: Secondary | ICD-10-CM | POA: Diagnosis present

## 2015-09-21 DIAGNOSIS — N1330 Unspecified hydronephrosis: Secondary | ICD-10-CM | POA: Diagnosis present

## 2015-09-21 DIAGNOSIS — F329 Major depressive disorder, single episode, unspecified: Secondary | ICD-10-CM | POA: Diagnosis present

## 2015-09-21 DIAGNOSIS — E870 Hyperosmolality and hypernatremia: Secondary | ICD-10-CM | POA: Diagnosis present

## 2015-09-21 DIAGNOSIS — I5033 Acute on chronic diastolic (congestive) heart failure: Secondary | ICD-10-CM | POA: Diagnosis present

## 2015-09-21 DIAGNOSIS — N179 Acute kidney failure, unspecified: Secondary | ICD-10-CM

## 2015-09-21 DIAGNOSIS — I482 Chronic atrial fibrillation: Secondary | ICD-10-CM | POA: Diagnosis present

## 2015-09-21 DIAGNOSIS — J15212 Pneumonia due to Methicillin resistant Staphylococcus aureus: Secondary | ICD-10-CM | POA: Diagnosis present

## 2015-09-21 DIAGNOSIS — R6521 Severe sepsis with septic shock: Secondary | ICD-10-CM | POA: Diagnosis not present

## 2015-09-21 DIAGNOSIS — D62 Acute posthemorrhagic anemia: Secondary | ICD-10-CM | POA: Diagnosis present

## 2015-09-21 DIAGNOSIS — A419 Sepsis, unspecified organism: Secondary | ICD-10-CM | POA: Diagnosis not present

## 2015-09-21 DIAGNOSIS — L8990 Pressure ulcer of unspecified site, unspecified stage: Secondary | ICD-10-CM | POA: Diagnosis present

## 2015-09-21 DIAGNOSIS — E669 Obesity, unspecified: Secondary | ICD-10-CM | POA: Diagnosis present

## 2015-09-21 DIAGNOSIS — K219 Gastro-esophageal reflux disease without esophagitis: Secondary | ICD-10-CM | POA: Diagnosis present

## 2015-09-21 DIAGNOSIS — I7101 Dissection of thoracic aorta: Secondary | ICD-10-CM | POA: Diagnosis present

## 2015-09-21 DIAGNOSIS — R532 Functional quadriplegia: Secondary | ICD-10-CM | POA: Diagnosis present

## 2015-09-21 DIAGNOSIS — G8929 Other chronic pain: Secondary | ICD-10-CM | POA: Diagnosis present

## 2015-09-21 DIAGNOSIS — L89154 Pressure ulcer of sacral region, stage 4: Secondary | ICD-10-CM | POA: Diagnosis present

## 2015-09-21 DIAGNOSIS — Z9911 Dependence on respirator [ventilator] status: Secondary | ICD-10-CM | POA: Diagnosis not present

## 2015-09-21 DIAGNOSIS — E0590 Thyrotoxicosis, unspecified without thyrotoxic crisis or storm: Secondary | ICD-10-CM | POA: Diagnosis present

## 2015-09-21 DIAGNOSIS — G40909 Epilepsy, unspecified, not intractable, without status epilepticus: Secondary | ICD-10-CM | POA: Diagnosis present

## 2015-09-21 DIAGNOSIS — F419 Anxiety disorder, unspecified: Secondary | ICD-10-CM | POA: Diagnosis present

## 2015-09-21 LAB — URINALYSIS, ROUTINE W REFLEX MICROSCOPIC
GLUCOSE, UA: NEGATIVE mg/dL
Ketones, ur: 15 mg/dL — AB
Nitrite: POSITIVE — AB
SPECIFIC GRAVITY, URINE: 1.02 (ref 1.005–1.030)
pH: 6.5 (ref 5.0–8.0)

## 2015-09-21 LAB — BASIC METABOLIC PANEL
Anion gap: 11 (ref 5–15)
BUN: 181 mg/dL — ABNORMAL HIGH (ref 6–20)
CALCIUM: 6.1 mg/dL — AB (ref 8.9–10.3)
CHLORIDE: 107 mmol/L (ref 101–111)
CO2: 26 mmol/L (ref 22–32)
CREATININE: 3.69 mg/dL — AB (ref 0.44–1.00)
GFR calc Af Amer: 14 mL/min — ABNORMAL LOW (ref >60)
GFR calc non Af Amer: 12 mL/min — ABNORMAL LOW (ref >60)
GLUCOSE: 117 mg/dL — AB (ref 65–99)
Potassium: 2.9 mmol/L — ABNORMAL LOW (ref 3.5–5.1)
Sodium: 144 mmol/L (ref 135–145)

## 2015-09-21 LAB — CBC
HEMATOCRIT: 12 % — AB (ref 36.0–46.0)
Hemoglobin: 3.8 g/dL — CL (ref 12.0–15.0)
MCH: 26 pg (ref 26.0–34.0)
MCHC: 31.7 g/dL (ref 30.0–36.0)
MCV: 82.2 fL (ref 78.0–100.0)
Platelets: 94 10*3/uL — ABNORMAL LOW (ref 150–400)
RBC: 1.46 MIL/uL — ABNORMAL LOW (ref 3.87–5.11)
RDW: 18.5 % — AB (ref 11.5–15.5)
WBC: 9.6 10*3/uL (ref 4.0–10.5)

## 2015-09-21 LAB — PROTIME-INR
INR: 1.59 — ABNORMAL HIGH (ref 0.00–1.49)
Prothrombin Time: 19 seconds — ABNORMAL HIGH (ref 11.6–15.2)

## 2015-09-21 LAB — VALPROIC ACID LEVEL: Valproic Acid Lvl: <10 ug/mL — ABNORMAL LOW (ref 50.0–100.0)

## 2015-09-21 LAB — RETICULOCYTES
RBC.: 1.84 MIL/uL — AB (ref 3.87–5.11)
RETIC CT PCT: 0.6 % (ref 0.4–3.1)
Retic Count, Absolute: 11 10*3/uL — ABNORMAL LOW (ref 19.0–186.0)

## 2015-09-21 LAB — I-STAT ARTERIAL BLOOD GAS, ED
ACID-BASE EXCESS: 8 mmol/L — AB (ref 0.0–2.0)
Bicarbonate: 33.4 mEq/L — ABNORMAL HIGH (ref 20.0–24.0)
O2 Saturation: 89 %
PCO2 ART: 51 mmHg — AB (ref 35.0–45.0)
PH ART: 7.426 (ref 7.350–7.450)
PO2 ART: 58 mmHg — AB (ref 80.0–100.0)
Patient temperature: 99.4
TCO2: 35 mmol/L (ref 0–100)

## 2015-09-21 LAB — IRON AND TIBC
Iron: 13 ug/dL — ABNORMAL LOW (ref 28–170)
SATURATION RATIOS: 8 % — AB (ref 10.4–31.8)
TIBC: 161 ug/dL — ABNORMAL LOW (ref 250–450)
UIBC: 148 ug/dL

## 2015-09-21 LAB — I-STAT CG4 LACTIC ACID, ED: Lactic Acid, Venous: 0.99 mmol/L (ref 0.5–2.0)

## 2015-09-21 LAB — MRSA PCR SCREENING: MRSA by PCR: NEGATIVE

## 2015-09-21 LAB — FOLATE: Folate: 48.9 ng/mL (ref >5.9)

## 2015-09-21 LAB — URINE MICROSCOPIC-ADD ON

## 2015-09-21 LAB — GLUCOSE, CAPILLARY
GLUCOSE-CAPILLARY: 96 mg/dL (ref 65–99)
Glucose-Capillary: 120 mg/dL — ABNORMAL HIGH (ref 65–99)
Glucose-Capillary: 112 mg/dL — ABNORMAL HIGH (ref 65–99)

## 2015-09-21 LAB — VITAMIN B12: VITAMIN B 12: 1163 pg/mL — AB (ref 180–914)

## 2015-09-21 LAB — PREPARE RBC (CROSSMATCH)

## 2015-09-21 LAB — FERRITIN: FERRITIN: 820 ng/mL — AB (ref 11–307)

## 2015-09-21 LAB — LACTATE DEHYDROGENASE: LDH: 156 U/L (ref 98–192)

## 2015-09-21 MED ORDER — PANTOPRAZOLE SODIUM 40 MG IV SOLR
40.0000 mg | Freq: Two times a day (BID) | INTRAVENOUS | Status: DC
  Administered 2015-09-21 – 2015-10-02 (×25): 40 mg via INTRAVENOUS
  Filled 2015-09-21 (×25): qty 40

## 2015-09-21 MED ORDER — POTASSIUM CHLORIDE 20 MEQ/15ML (10%) PO SOLN
40.0000 meq | Freq: Once | ORAL | Status: AC
  Administered 2015-09-21: 40 meq via ORAL
  Filled 2015-09-21: qty 30

## 2015-09-21 MED ORDER — SODIUM CHLORIDE 0.9 % IV SOLN
1.0000 g | Freq: Once | INTRAVENOUS | Status: AC
  Administered 2015-09-21: 1 g via INTRAVENOUS
  Filled 2015-09-21: qty 10

## 2015-09-21 MED ORDER — SODIUM CHLORIDE 0.9 % IV SOLN
500.0000 mg | Freq: Two times a day (BID) | INTRAVENOUS | Status: DC
  Administered 2015-09-21 – 2015-09-27 (×13): 500 mg via INTRAVENOUS
  Filled 2015-09-21 (×15): qty 0.5

## 2015-09-21 MED ORDER — ACETAMINOPHEN 160 MG/5ML PO SOLN
650.0000 mg | ORAL | Status: DC | PRN
  Administered 2015-09-21 – 2015-09-26 (×11): 650 mg
  Filled 2015-09-21 (×10): qty 20.3

## 2015-09-21 MED ORDER — POTASSIUM CHLORIDE CRYS ER 20 MEQ PO TBCR: 40.0000 meq | EXTENDED_RELEASE_TABLET | Freq: Once | ORAL | Status: DC

## 2015-09-21 MED ORDER — POTASSIUM CHLORIDE 20 MEQ/15ML (10%) PO SOLN
40.0000 meq | Freq: Once | ORAL | Status: AC
  Administered 2015-09-21: 40 meq
  Filled 2015-09-21: qty 30

## 2015-09-21 MED ORDER — DIPHENHYDRAMINE HCL 50 MG/ML IJ SOLN
12.5000 mg | Freq: Four times a day (QID) | INTRAMUSCULAR | Status: DC | PRN
  Administered 2015-09-21 – 2015-10-09 (×5): 12.5 mg via INTRAVENOUS
  Filled 2015-09-21 (×5): qty 1

## 2015-09-21 MED ORDER — VALPROATE SODIUM 250 MG/5ML PO SYRP
125.0000 mg | ORAL_SOLUTION | Freq: Two times a day (BID) | ORAL | Status: DC
  Administered 2015-09-21 – 2015-10-10 (×40): 125 mg
  Filled 2015-09-21 (×34): qty 5
  Filled 2015-09-21: qty 2.5
  Filled 2015-09-21 (×27): qty 5

## 2015-09-21 MED ORDER — ANTISEPTIC ORAL RINSE SOLUTION (CORINZ)
7.0000 mL | Freq: Four times a day (QID) | OROMUCOSAL | Status: DC
  Administered 2015-09-21 – 2015-10-10 (×71): 7 mL via OROMUCOSAL

## 2015-09-21 MED ORDER — DARBEPOETIN ALFA 60 MCG/0.3ML IJ SOSY
60.0000 ug | PREFILLED_SYRINGE | INTRAMUSCULAR | Status: DC
  Administered 2015-09-21: 60 ug via SUBCUTANEOUS
  Filled 2015-09-21 (×2): qty 0.3

## 2015-09-21 MED ORDER — FENTANYL CITRATE (PF) 100 MCG/2ML IJ SOLN: 50.0000 ug | INTRAMUSCULAR | Status: DC | PRN

## 2015-09-21 MED ORDER — SODIUM CHLORIDE 0.9 % IV SOLN
250.0000 mL | INTRAVENOUS | Status: DC | PRN
  Administered 2015-09-25: 250 mL via INTRAVENOUS

## 2015-09-21 MED ORDER — CHLORHEXIDINE GLUCONATE 0.12% ORAL RINSE (MEDLINE KIT)
15.0000 mL | Freq: Two times a day (BID) | OROMUCOSAL | Status: DC
  Administered 2015-09-21 – 2015-10-10 (×39): 15 mL via OROMUCOSAL

## 2015-09-21 MED ORDER — VANCOMYCIN HCL 10 G IV SOLR: 1750.0000 mg | INTRAVENOUS | Status: DC

## 2015-09-21 MED ORDER — FENTANYL CITRATE (PF) 100 MCG/2ML IJ SOLN
50.0000 ug | INTRAMUSCULAR | Status: DC | PRN
  Administered 2015-09-21 – 2015-10-07 (×28): 50 ug via INTRAVENOUS
  Filled 2015-09-21 (×31): qty 2

## 2015-09-21 MED ORDER — POTASSIUM CHLORIDE 10 MEQ/50ML IV SOLN: 10.0000 meq | INTRAVENOUS | Status: DC

## 2015-09-21 MED ORDER — COLLAGENASE 250 UNIT/GM EX OINT
TOPICAL_OINTMENT | Freq: Every day | CUTANEOUS | Status: DC
  Administered 2015-09-22 – 2015-10-01 (×8): via TOPICAL
  Administered 2015-10-02: 2 via TOPICAL
  Filled 2015-09-21 (×4): qty 30

## 2015-09-21 MED ORDER — IPRATROPIUM-ALBUTEROL 0.5-2.5 (3) MG/3ML IN SOLN: 3.0000 mL | RESPIRATORY_TRACT | Status: DC | PRN

## 2015-09-21 MED ORDER — SODIUM CHLORIDE 0.9% FLUSH: 3.0000 mL | INTRAVENOUS | Status: DC | PRN

## 2015-09-21 MED ORDER — SODIUM CHLORIDE 0.9% FLUSH
3.0000 mL | Freq: Two times a day (BID) | INTRAVENOUS | Status: DC
  Administered 2015-09-21: 10 mL via INTRAVENOUS
  Administered 2015-09-21 (×2): 3 mL via INTRAVENOUS
  Administered 2015-09-22: 10 mL via INTRAVENOUS
  Administered 2015-09-22 – 2015-09-27 (×7): 3 mL via INTRAVENOUS
  Administered 2015-09-27 – 2015-09-28 (×2): 10 mL via INTRAVENOUS
  Administered 2015-09-28 – 2015-09-29 (×3): 3 mL via INTRAVENOUS
  Administered 2015-09-30: 10 mL via INTRAVENOUS
  Administered 2015-09-30 – 2015-10-01 (×2): 3 mL via INTRAVENOUS
  Administered 2015-10-01 – 2015-10-02 (×2): 10 mL via INTRAVENOUS

## 2015-09-21 NOTE — Progress Notes (Signed)
[facility] Progress Note Patient Name: LAJEANA BEAUDRIE DOB: [birth year] MRN: [id number]   Date of Service  09/21/2015  HPI/Events of Note  Blood released by blood bank for transfusion Had questionable reaction to blood recently  eICU Interventions  Give tylenol benadryl     Intervention Category Intermediate Interventions: Bleeding - evaluation and treatment with blood products  Simonne Maffucci 09/21/2015, 4:03 PM

## 2015-09-21 NOTE — ED Notes (Signed)
Pleura vac noted to R chest with questionable placed. Sutures noted around tubing, not connected to skin

## 2015-09-21 NOTE — H&P (Signed)
PULMONARY / CRITICAL CARE MEDICINE   Name: JONIAH NAMETH MRN: [id number] DOB: 1950/08/19    ADMISSION DATE:  09/20/2015 CONSULTATION DATE:  09/20/2015  REFERRING MD:  EDP  CHIEF COMPLAINT:  Anemia, shock  HISTORY OF PRESENT ILLNESS:   65 year old female from [facility] who is trach/vent/PEG dependent at baseline. She has complicated past medical history as below, which includes seizure disorder, multiple CVAs, atrial fibrillation, chronic kidney disease, sacral decubitus, R sided pleural effusion with Pleur-X in place, diastolic CHF, and L hydronephrosis with chronic nephrostomy tube. She has also recently been treated for funguria with diflucan, pseudomonas PNA with ertapenem, and sacral wound has tested positive for MRSA. She was recently started on cefepime and vancomycin, for what is reportedly a MRSA PNA. She has also been requiring frequent blood transfusions for what is thought to be progression of her chronic anemia, however, she has multiple antibodies in her blood and is difficult to find transfusions for. [facility] has listed her as futility of care due to this anemia, however, husband has reportedly been very reluctant to address goals of care.   3/15 Mrs. Tineo developed hypotension and hemoglobin was noted to be 4.2. She was transfused one unit of blood, which was all that was available to [facility] at the time. Husband was contacted and requested a transfer to the emergency department so that she could receive further transfusions. In the emergency department she was noted to be febrile with leukocytosis. Hemoglobin was confirmed to be low at 5. Transfusions were ordered by ED staff, however, blood bank is unsure they will be able to accommodate this request due to her cross matching difficulties. Broad spectrum antibiotics were resumed.  PAST MEDICAL HISTORY :  She  has a past medical history of Ventilator dependent ([place]); Atrial fibrillation ([place]); Seizures ([place]); Pleural effusion; Renal  disorder; Stroke [facility]); Quadriplegia, functional ([place]); CHF (congestive heart failure) ([place]); Hyperthyroidism; Anemia; and GERD (gastroesophageal reflux disease).  PAST SURGICAL HISTORY: She  has past surgical history that includes Abdominal surgery.  Allergies  Allergen Reactions  . Aspirin   . Heparin   . Lorazepam     No current facility-administered medications on file prior to encounter.   Current Outpatient Prescriptions on File Prior to Encounter  Medication Sig  . ciprofloxacin (CIPRO) 500 MG tablet Take 1 tablet (500 mg total) by mouth every 12 (twelve) hours.    FAMILY HISTORY:  Her has no family status information on file.   SOCIAL HISTORY: She  reports that she does not drink alcohol.  REVIEW OF SYSTEMS:   unable  SUBJECTIVE:    VITAL SIGNS: BP 92/62 mmHg  Pulse 84  Temp(Src) 99.4 F (37.4 C) (Rectal)  Resp 24  SpO2 100%  HEMODYNAMICS:    VENTILATOR SETTINGS: Vent Mode:  [-] PCV FiO2 (%):  [40 %] 40 % Set Rate:  [18 bmp] 18 bmp Vt Set:  [450 mL] 450 mL PEEP:  [5 cmH20] 5 cmH20 Plateau Pressure:  [22 cmH20-30 cmH20] 22 cmH20  INTAKE / OUTPUT:    PHYSICAL EXAMINATION: General:  Obese, chronically ill appearing female on  Vent via trach Neuro:  Unresponsive, does not follow commands, facial grimace only to painful stimuli.  HEENT:  /AT, PERRL, Difficult to discern JVD due to neck girth Cardiovascular:  S1, S2 intact, no MRG Lungs:  Coarse bilateral breath sounds Abdomen:  Soft, non-distended Musculoskeletal:  +3 edema to BUE/ anasarca Skin:  Severe stage 4 sacral wound with exposed bone, 2 pressure wounds to  R foot.   LABS:  BMET  Recent Labs Lab 09/20/15 2306  NA 139  K 4.1  CL 95*  CO2 30  BUN 238*  CREATININE 4.63*  GLUCOSE 131*    Electrolytes  Recent Labs Lab 09/20/15 2306  CALCIUM 7.8*    CBC  Recent Labs Lab 09/20/15 2306  WBC 12.6*  HGB 5.0*  HCT 16.1*  PLT 136*    Coag's No results for input(s):  APTT, INR in the last 168 hours.  Sepsis Markers  Recent Labs Lab 09/20/15 2319  LATICACIDVEN 1.95    ABG No results for input(s): PHART, PCO2ART, PO2ART in the last 168 hours.  Liver Enzymes  Recent Labs Lab 09/20/15 2306  AST 28  ALT 18  ALKPHOS 103  BILITOT 0.4  ALBUMIN 1.2*    Cardiac Enzymes No results for input(s): TROPONINI, PROBNP in the last 168 hours.  Glucose No results for input(s): GLUCAP in the last 168 hours.  Imaging Dg Chest Port 1 View  09/20/2015  CLINICAL DATA:  Acute onset of fever and shortness of breath. Assess for pneumonia. Low blood pressure. Initial encounter. EXAM: PORTABLE CHEST 1 VIEW COMPARISON:  Chest radiograph performed 06/30/2015 FINDINGS: The patient's tracheostomy tube is noted ending 7 cm above the carina. A right apical chest tube is unchanged in position. No definite pneumothorax is seen. A right PICC ends overlying the proximal SVC, though not well characterized due to patient rotation. The lungs are well-aerated. Vascular congestion is noted, with increased interstitial markings and bilateral opacities, which may reflect pulmonary edema or pneumonia. There is no evidence of pleural effusion or pneumothorax. The cardiomediastinal silhouette is mildly enlarged. No acute osseous abnormalities are seen. IMPRESSION: Vascular congestion and mild cardiomegaly. Increased interstitial markings and bilateral opacities may reflect pulmonary edema or pneumonia. Evaluation is somewhat suboptimal due to patient rotation. Electronically Signed   By: Garald Balding M.D.   On: 09/20/2015 23:14    STUDIES:    CULTURES: BCx2 3/16 > Urine 3/16 > Tracheal aspirate 3/16 >  ANTIBIOTICS: Meropenem 3/16 > Vancomycin 3/16 >  SIGNIFICANT EVENTS: 3/16 transfer from [facility] to [facility] for anemia/hypotension  LINES/TUBES: Trach #7 cuffed >> RUE PICC 2/3 >>>  DISCUSSION: 65 year old unfortunate female. Vent/trach dependent from [facility]. Presented 3/15  with anemia and hypotension. Unable to transfuse effectively at [facility] due to multiple antibodies in blood. Could be just chronic anemia, but concern for GIB or hematureia  ASSESSMENT / PLAN:  PULMONARY A: Chronic respiratory failure with ventilator dependence Chronic R effusion with PleurX, appears dislodged from suture  P:   Full vent support ABG Vent bundle Assess PleurX  CARDIOVASCULAR A:  Hypotension secondary to ABLA Atrial Fibrillation Acute on chronic diastolic CHF  P:  Telemetry monitoring Transfuse PRBC Hold home antihypertensive medications (hydralazine, metoprolol) Trend lactic  RENAL A:   Acute on CKD - appears prerenal L hydronephrosis with nephrostomy tube Chronic indwelling foley catheter  P:   Follow BMP  Saline lock Not a dialysis candidate  GASTROINTESTINAL A:   GERD Possible GIB Severe protein calorie malnutrition   P:   NPO Protonix BID Resume TF if GIB ruled out  HEMATOLOGIC A:   Acute blood loss on anemia of chronic illness. Possibly from GIB, hematuria  P:  Transfuse 2 units PRBC Serial CBC Assess coags, LDH, haptoglobin.  INFECTIOUS A:   Possible sepsis, source unclear consider PNA, UTI, Sacral wound, or any number of chronic indwelling lines/tubes.  P:   ABX as above (  mero, vanc) Trend WBC and fever curves PCT  ENDOCRINE  A: No acute issues  P:   Follow glucose on chemistry  NEUROLOGIC A:   Seizure disorder Anxiety/depression Chronic pain H/o several CVA P:   RASS goal: 0 PRN fentanyl Continue valproic acid, assess level  Holding lexapro, trazodone    FAMILY  - Updates: unable to contact family in ED  - Inter-disciplinary family meet or Palliative Care meeting due by:  3/23   Georgann Housekeeper, AGACNP-BC [place] Pulmonology/Critical Care Pager [phone] or [phone]  09/21/2015 1:26 AM

## 2015-09-21 NOTE — ED Notes (Signed)
Waiting for vanc from pharmacy 

## 2015-09-21 NOTE — Progress Notes (Addendum)
[facility] Progress Note Patient Name: Jacqueline Fuentes DOB: 1950-10-15 MRN: [id number]   Date of Service  09/21/2015  HPI/Events of Note  K=2.9 Ca=6.1  eICU Interventions  Replaced      Intervention Category Major Interventions: Electrolyte abnormality - evaluation and management  Gad Aymond 09/21/2015, 6:10 AM

## 2015-09-21 NOTE — Progress Notes (Signed)
CRITICAL VALUE ALERT  Critical value received:  Hg 3.8  Date of notification:  09/21/2015  Time of notification:  0606  Critical value read back:Yes.    Nurse who received alert:  Reynolds Bowl RN  MD notified (1st page):  PCCM  Time of first page:  0606  MD notified (2nd page):  Time of second page:  Responding MD:  PCCM  Time MD responded:

## 2015-09-21 NOTE — Progress Notes (Signed)
Report rec'd from ED nurse.

## 2015-09-21 NOTE — Clinical Social Work Note (Signed)
Clinical Social Work Assessment  Patient Details  Name: Jacqueline Fuentes MRN: [id number] Date of Birth: 1951-01-21  Date of referral:  09/21/15               Reason for consult:  Other (Comment Required) (patient is from SNF: [facility])                Permission sought to share information with:  Case Freight forwarder, Customer service manager, Family Supports Permission granted to share information::  Yes, Verbal Permission Granted  Name::        Agency::  [facility]  Relationship::  Husband: Jacqueline Fuentes  Sport and exercise psychologist Information:     Housing/Transportation Living arrangements for the past 2 months:  [place] of Information:  Scientist, water quality, Facility, Spouse Patient Interpreter Needed:  None Criminal Activity/Legal Involvement Pertinent to Current Situation/Hospitalization:  No - Comment as needed Significant Relationships:  Adult Children, Spouse, Warehouse manager, Other Family Members Lives with:  Facility Resident Do you feel safe going back to the place where you live?  Yes (facility agreeable for pt return.) Need for family participation in patient care:  Yes (Comment) (due to medical condition trach/vent/PEG dependent at baseline)  Care giving concerns:  No concerns at this time. Facility contacted and reports husband very involved in care.  Attempting to help patient's family at facility with palliative care and [place], however family has not been ready to discuss [place].  Call placed to husband, however unable to reach.  Made CSW unit aware of call.   Social Worker assessment / plan:  LCSW completed FL2 and contacted facility regarding patient being admitted. Patient's family was contacted, but unable to reach.  Still attempting and will follow while in hospital.    Employment status:  Disabled (Comment on whether or not currently receiving Disability) Insurance information:  Medicare, Medicaid In [place] PT Recommendations:  [place] / Referral to  community resources:  Other (Comment Required) (none at this time)  Patient/Family's Response to care:  Unknown at this time.  Patient unable to assess and family did not answer phone.  Most likely will return to SNF at DC pending medical status.  palliative is involved and has been consulted.   Patient/Family's Understanding of and Emotional Response to Diagnosis, Current Treatment, and Prognosis:  Will attempt once family is reached.  From facility's standpoint, family has been very active in care, but in denial about current prognosis.   Emotional Assessment Appearance:  Appears stated age, Appears older than stated age Attitude/Demeanor/Rapport:  Intubated (Following Commands or Not Following Commands) (not following at this time) Affect (typically observed):  Stable Orientation:  Oriented to Self Alcohol / Substance use:  Not Applicable Psych involvement (Current and /or in the community):  No (Comment)  Discharge Needs  Concerns to be addressed:  Care Coordination, Other (Comment Required (none at this time.  Attempting to reach family via phone) Readmission within the last 30 days:  No Current discharge risk:  None Barriers to Discharge:  No Barriers Identified, Continued Medical Work up   [place], LCSW 09/21/2015, 11:18 AM

## 2015-09-21 NOTE — Progress Notes (Signed)
Brother  & Daughter  lives in [place] Husband lives in [place], [place] pt's brother in [place]  [place] # [phone]

## 2015-09-21 NOTE — Progress Notes (Signed)
PULMONARY / CRITICAL CARE MEDICINE   Name: Jacqueline Fuentes MRN: [id number] DOB: 1950/12/05    ADMISSION DATE:  09/20/2015 CONSULTATION DATE:  09/20/2015  REFERRING MD:  EDP  CHIEF COMPLAINT:  Anemia, shock REVIEW OF SYSTEMS:   Unable  DISCUSSION: 65 year old unfortunate female. Vent/trach dependent from [facility]. Presented 3/15 with anemia and hypotension. Unable to transfuse effectively at [facility] due to multiple antibodies in blood. Could be just chronic anemia, but concern for GIB or hematuria.  SUBJECTIVE:  Patient remains obtunded with minimum response to painful stimuli.  VITAL SIGNS: BP 105/57 mmHg  Pulse 75  Temp(Src) 99.8 F (37.7 C) (Rectal)  Resp 21  Ht 5\' 8"  (1.727 m)  Wt 236 lb 12.4 oz (107.4 kg)  BMI 36.01 kg/m2  SpO2 100%  HEMODYNAMICS: CVP:  [14 mmHg] 14 mmHg  VENTILATOR SETTINGS: Vent Mode:  [-] PCV FiO2 (%):  [40 %] 40 % Set Rate:  [18 bmp] 18 bmp Vt Set:  [450 mL] 450 mL PEEP:  [5 cmH20] 5 cmH20 Plateau Pressure:  [22 cmH20-30 cmH20] 27 cmH20  INTAKE / OUTPUT: I/O last 3 completed shifts: In: -  Out: 725 [Urine:725]  PHYSICAL EXAMINATION: General:  Obese, chronically ill appearing female on  Vent via trach Neuro:  Unresponsive, does not follow commands, facial grimace only to painful stimuli.  HEENT:  Atraumatic, normocephalic, no discharge. 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   LABS:  BMET  Recent Labs Lab 09/20/15 2306 09/21/15 0525  NA 139 144  K 4.1 2.9*  CL 95* 107  CO2 30 26  BUN 238* 181*  CREATININE 4.63* 3.69*  GLUCOSE 131* 117*    Electrolytes  Recent Labs Lab 09/20/15 2306 09/21/15 0525  CALCIUM 7.8* 6.1*    CBC  Recent Labs Lab 09/20/15 2306 09/21/15 0525  WBC 12.6* 9.6  HGB 5.0*  3.8*  HCT 16.1* 12.0*  PLT 136* 94*    Coag's  Recent Labs Lab 09/21/15 0133  INR 1.59*    Sepsis Markers  Recent Labs Lab 09/20/15 2319 09/21/15 0133  LATICACIDVEN 1.95 0.99    ABG  Recent Labs Lab 09/21/15 0132  PHART 7.426  PCO2ART 51.0*  PO2ART 58.0*    Liver Enzymes  Recent Labs Lab 09/20/15 2306  AST 28  ALT 18  ALKPHOS 103  BILITOT 0.4  ALBUMIN 1.2*    Cardiac Enzymes No results for input(s): TROPONINI, PROBNP in the last 168 hours.  Glucose  Recent Labs Lab 09/21/15 0327  GLUCAP 120*    Imaging Dg Chest Port 1 View  09/21/2015  CLINICAL DATA:  Respiratory failure. EXAM: PORTABLE CHEST 1 VIEW COMPARISON:  September 20, 2015. FINDINGS: Stable cardiomegaly. Prominence of superior mediastinum is noted which may represent aortic aneurysm or adenopathy. Tracheostomy is in grossly good position. Right-sided PICC line is again noted and unchanged, with distal tip in expected position of right subclavian vein. Right-sided chest tube is unchanged in position without evidence of pneumothorax. Stable mild interstitial densities are noted throughout both lungs which may represent edema. IMPRESSION: Prominence of superior mediastinum is noted which may represent aortic aneurysm or adenopathy. CT scan of the chest is recommended for further evaluation. Right-sided chest tube is noted without pneumothorax. Stable mild interstitial densities are noted throughout both lungs  which may represent edema. Stable support apparatus. Electronically Signed   By: Marijo Conception, M.D.   On: 09/21/2015 08:13   Dg Chest Port 1 View  09/20/2015  CLINICAL DATA:  Acute onset of fever and shortness of breath. Assess for pneumonia. Low blood pressure. Initial encounter. EXAM: PORTABLE CHEST 1 VIEW COMPARISON:  Chest radiograph performed 06/30/2015 FINDINGS: The patient's tracheostomy tube is noted ending 7 cm above the carina. A right apical chest tube is unchanged in position. No  definite pneumothorax is seen. A right PICC ends overlying the proximal SVC, though not well characterized due to patient rotation. The lungs are well-aerated. Vascular congestion is noted, with increased interstitial markings and bilateral opacities, which may reflect pulmonary edema or pneumonia. There is no evidence of pleural effusion or pneumothorax. The cardiomediastinal silhouette is mildly enlarged. No acute osseous abnormalities are seen. IMPRESSION: Vascular congestion and mild cardiomegaly. Increased interstitial markings and bilateral opacities may reflect pulmonary edema or pneumonia. Evaluation is somewhat suboptimal due to patient rotation. Electronically Signed   By: Garald Balding M.D.   On: 09/20/2015 23:14    STUDIES:  none  CULTURES: BCx2 3/16 > Urine 3/16 > Tracheal aspirate 3/16 >  ANTIBIOTICS: Meropenem 3/16 > Vancomycin 3/16 >  SIGNIFICANT EVENTS: 3/16 transfer from [facility] to [facility] for anemia/hypotension  LINES/TUBES: Trach #7 cuffed >> RUE PICC 2/3 >>>   ASSESSMENT / PLAN:  PULMONARY A: Chronic respiratory failure with ventilator dependence Chronic R effusion with PleurX, appears dislodged from suture P:   Full vent support ABG in am Vent bundle Duoneb PRN Follow up CXR  CARDIOVASCULAR A:  Hypotension secondary to ABLA Atrial Fibrillation Acute on chronic diastolic CHF P:  Telemetry monitoring Transfuse PRBC Hold home antihypertensive medications (hydralazine, metoprolol)  RENAL A:   Acute on CKD - appears prerenal L hydronephrosis with nephrostomy tube Chronic indwelling foley catheter Hypokalemia Hypocalcemia P:   Follow BMP  Saline lock Not a dialysis candidate Replace lytes  GASTROINTESTINAL A:   GERD Possible GIB Severe protein calorie malnutrition  P:   NPO Continue Protonix Resume TF if GIB ruled out  HEMATOLOGIC A:   Acute blood loss on anemia of chronic illness. Possibly from GIB, hematuria P:  Transfuse 2  units PRBC Serial CBC Assess coags, LDH, haptoglobin.  INFECTIOUS A:   Possible sepsis, source unclear consider PNA, UTI, Sacral wound, or any number of chronic indwelling lines/tubes. P:   Continue mero, vanc Trend WBC and fever curves Follow Blood, urine and sputum culture Wound care consult to evaluate pressure ulcer  ENDOCRINE A: No acute issues P:   Trend Glucose  NEUROLOGIC A:   Seizure disorder Anxiety/depression Chronic pain H/o several CVA P:   RASS goal: 0 PRN fentanyl Continue valproic acid,  Continue to hold lexapro, trazodone     Bincy Varughese,AG-ACNP Pulmonary & Critical Care  Reviewed, examined.  She is obtunded.  Hb 3.8.  Widened mediastinum on CXR >> nothing to compare to.  Assessment/plan: Anemia. - transfuse if able >> multiple antibodies - aranesp - check iron levels  - f/u Haptoglobin  Widened mediastinum. - check non contrast CT chest   CKD. - monitor renal fx  Acute on chronic respiratory failure. Failure to wean s/p trach. Chronic Rt effusion s/p pleurx. - full vent support - continue pleurx  Possible HCAP. - continue Abx  Chesley Mires, MD [facility] Pulmonary/Critical Care 09/21/2015, 2:08 PM Pager:  [phone] After 3pm call: [phone]

## 2015-09-21 NOTE — ED Notes (Signed)
Contacted [facility] for possible results for antigens in blood, Staff unable to recall results. Blood bank updated

## 2015-09-21 NOTE — Progress Notes (Signed)
CRITICAL VALUE ALERT  Critical value received:  Calcium  Date of notification:  09/21/2015  Time of notification:  0606  Critical value read back:Yes.    Nurse who received alert:  Reynolds Bowl RN   MD notified (1st page):  PCCM  Time of first page:  0606  MD notified (2nd page):  Time of second page:  Responding MD:    Time MD responded:

## 2015-09-21 NOTE — NC FL2 (Signed)
[place] MEDICAID FL2 LEVEL OF CARE SCREENING TOOL     IDENTIFICATION  Patient Name: Jacqueline Fuentes Birthdate: 1950-11-22 Sex: female Admission Date (Current Location): 09/20/2015  [facility] and [place] Number:  [employer] and Address:  [place] [address], [place]      Provider Number: [id number]  Attending Physician Name and Address:  Rigoberto Noel, MD  Relative Name and Phone Number:       Current Level of Care: Hospital Recommended Level of Care: [place] Prior Approval Number:    Date Approved/Denied:   PASRR Number:    Discharge Plan: SNF    Current Diagnoses: Patient Active Problem List   Diagnosis Date Noted  . Acute blood loss anemia 09/21/2015  . Pressure ulcer 09/21/2015    Orientation RESPIRATION BLADDER Height & Weight     Self  Vent Incontinent, Indwelling catheter Weight: 236 lb 12.4 oz (107.4 kg) Height:  5\' 8"  (172.7 cm)  BEHAVIORAL SYMPTOMS/MOOD NEUROLOGICAL BOWEL NUTRITION STATUS  Other (Comment) (none)   Incontinent Feeding tube  AMBULATORY STATUS COMMUNICATION OF NEEDS Skin   Total Care Verbally Other (Comment) (sacrum and right leg and heel.   Unstageable pressure injury to right inner heel 2X3cm)                       Personal Care Assistance Level of Assistance  Bathing, Feeding, Dressing Bathing Assistance: Maximum assistance Feeding assistance: Maximum assistance Dressing Assistance: Maximum assistance     Functional Limitations Info  Hearing, Sight, Speech Sight Info: Adequate Hearing Info: Adequate Speech Info: Impaired    SPECIAL CARE FACTORS FREQUENCY   (wound care)                    Contractures Contractures Info: Not present    Additional Factors Info  Code Status, Allergies, Psychotropic, Insulin Sliding Scale, Isolation Precautions Code Status Info: Full Allergies Info: Asprin, Heparin, Lorazapam Psychotropic Info: none noted Insulin Sliding  Scale Info: none Isolation Precautions Info: none currently      Current Medications (09/21/2015):  This is the current hospital active medication list Current Facility-Administered Medications  Medication Dose Route Frequency Provider Last Rate Last Dose  . 0.9 %  sodium chloride infusion  250 mL Intravenous PRN Corey Harold, NP      . collagenase (SANTYL) ointment   Topical Daily Kara Mead V, MD      . fentaNYL (SUBLIMAZE) injection 50 mcg  50 mcg Intravenous Q15 min PRN Corey Harold, NP      . fentaNYL (SUBLIMAZE) injection 50 mcg  50 mcg Intravenous Q2H PRN Corey Harold, NP   50 mcg at 09/21/15 0444  . ipratropium-albuterol (DUONEB) 0.5-2.5 (3) MG/3ML nebulizer solution 3 mL  3 mL Nebulization Q2H PRN Corey Harold, NP      . meropenem (MERREM) 500 mg in sodium chloride 0.9 % 50 mL IVPB  500 mg Intravenous Q12H Veronda P Bryk, RPH 100 mL/hr at 09/21/15 1006 500 mg at 09/21/15 1006  . pantoprazole (PROTONIX) injection 40 mg  40 mg Intravenous Q12H Corey Harold, NP   40 mg at 09/21/15 0445  . sodium chloride flush (NS) 0.9 % injection 3 mL  3 mL Intravenous Q12H Corey Harold, NP   10 mL at 09/21/15 1008  . sodium chloride flush (NS) 0.9 % injection 3 mL  3 mL Intravenous PRN Corey Harold, NP      .  valproic acid (DEPAKENE) 250 MG/5ML syrup 125 mg  125 mg Per Tube BID Corey Harold, NP   125 mg at 09/21/15 0525  . [START ON 09/23/2015] vancomycin (VANCOCIN) 1,750 mg in sodium chloride 0.9 % 500 mL IVPB  1,750 mg Intravenous Q48H Laren Everts, RPH         Discharge Medications: Please see discharge summary for a list of discharge medications.  Relevant Imaging Results:  Relevant Lab Results:   Additional Information    Lilly [place], LCSW

## 2015-09-21 NOTE — Progress Notes (Signed)
Pt had possible blood transfusion reaction at [facility] care facility 3/15 - PCCM called orders placed for pre transfjsion pre-med Pt given IV benadryl and Tylenol per Bevely Palmer

## 2015-09-21 NOTE — Progress Notes (Signed)
Utilization Review Completed.  

## 2015-09-21 NOTE — Progress Notes (Signed)
Blood bank called says that the call is out to find this patient some blood for ordered transfusion of 2 units may be 6 hrs or moreWill call back later if any match is found

## 2015-09-21 NOTE — ED Notes (Signed)
CCM at bedside 

## 2015-09-21 NOTE — Progress Notes (Signed)
Initial Nutrition Assessment  DOCUMENTATION CODES:   Obesity unspecified  INTERVENTION:  -Recommend TF via PEG when medically appropriate.   -Recommend initiating Vital AF 1.2 @ 20 ml/hr increasing by 10 ml every 4 hours until goal of 50 ml/hr Provides 1440 kcal, 90 grams of protein, 973 ml of free water  NUTRITION DIAGNOSIS:   Inadequate oral intake related to inability to eat as evidenced by NPO status.  GOAL:   Patient will meet greater than or equal to 90% of their needs  MONITOR:   Vent status, Labs, Weight trends, TF tolerance, Skin, I & O's  REASON FOR ASSESSMENT:   Low [place], Consult  ASSESSMENT:   Pt from [facility] who is trach/vent/PEG dependent at baseline. She has complicated past medical history as below, which includes seizure disorder, multiple CVAs, atrial fibrillation, chronic kidney disease, sacral decubitus, R sided pleural effusion with Pleur-X in place, diastolic CHF, and L hydronephrosis with chronic nephrostomy tube.   PTA- PEG in LUQ   Pt is on ventilator support MV: 8.6 Temp: 37.7 Propofol: none  No family at bedside unable to obtain nutrition history.  Spoke with RN, she reports plan is resume TF via PEG tube as appropriate. Palliative Care team consulted.   Conducted nutrition focused physical exam, identified no muscle wasting, no fat wasting, moderate edema present.   Labs reviewed; potassium 2.9. Medication reviewed;   Diet Order:  Diet NPO time specified  Skin:  Wound (see comment) (Unstagable pressure injury on scarum and leg)  Last BM:  09/21/2015  Height:   Ht Readings from Last 1 Encounters:  09/21/15 5\' 8"  (1.727 m)    Weight:   Wt Readings from Last 1 Encounters:  09/21/15 236 lb 12.4 oz (107.4 kg)    Ideal Body Weight:  55.6 kg (-12.5% for quadriplegia)  BMI:  Body mass index is 36.01 kg/(m^2).  Estimated Nutritional Needs:   Kcal:  1400-1600  Protein:  85-100 grams  Fluid:  1.1-1.5 L  EDUCATION NEEDS:    No education needs identified at this time  [place], Dietetic Intern Pager: [phone]

## 2015-09-21 NOTE — Progress Notes (Signed)
I saw Ms. Fergusson this afternoon.  She remains unable to participate in conversation.  I left voicemails for her daughter and husband to call and discuss her care as well as set up a family meeting to discuss long term goals moving forward.  I have not heard back from either, and they were not present at time of my visit.  We will plan to schedule a family meeting as soon as family is available and we have an available provider.  Please let me know if we can be of further assistance in the interim.   Micheline Rough, MD [place] Team [phone]

## 2015-09-21 NOTE — Progress Notes (Signed)
MEDICATION RELATED CONSULT NOTE - INITIAL   Pharmacy Consult for Aranesp  Indication: Anemia of CKD  Allergies  Allergen Reactions  . Aspirin   . Heparin   . Lorazepam     Patient Measurements: Height: 5\' 8"  (172.7 cm) Weight: 236 lb 12.4 oz (107.4 kg) IBW/kg (Calculated) : 63.9 Adjusted Body Weight:   Vital Signs: Temp: 99.6 F (37.6 C) (03/16 1200) Temp Source: Oral (03/16 1200) BP: 92/53 mmHg (03/16 1219) Pulse Rate: 77 (03/16 1219) Intake/Output from previous day: 03/15 0701 - 03/16 0700 In: 660 [IV Piggyback:660] Out: 725 [Urine:725] Intake/Output from this shift: Total I/O In: 50 [IV Piggyback:50] Out: 275 [Urine:275]  Labs:  Recent Labs  09/20/15 2306 09/21/15 0525  WBC 12.6* 9.6  HGB 5.0* 3.8*  HCT 16.1* 12.0*  PLT 136* 94*  CREATININE 4.63* 3.69*  ALBUMIN 1.2*  --   PROT 6.6  --   AST 28  --   ALT 18  --   ALKPHOS 103  --   BILITOT 0.4  --    Estimated Creatinine Clearance: 19.5 mL/min (by C-G formula based on Cr of 3.69).   Microbiology: Recent Results (from the past 720 hour(s))  MRSA PCR Screening     Status: None   Collection Time: 09/21/15  3:00 AM  Result Value Ref Range Status   MRSA by PCR NEGATIVE NEGATIVE Final    Comment:        The GeneXpert MRSA Assay (FDA approved for NASAL specimens only), is one component of a comprehensive MRSA colonization surveillance program. It is not intended to diagnose MRSA infection nor to guide or monitor treatment for MRSA infections.     Medical History: Past Medical History  Diagnosis Date  . Ventilator dependent ([place])   . Atrial fibrillation ([place])   . Seizures ([place])   . Pleural effusion   . Renal disorder   . Stroke ([place])   . Quadriplegia, functional ([place])   . CHF (congestive heart failure) ([place])   . Hyperthyroidism   . Anemia   . GERD (gastroesophageal reflux disease)     Medications:  Prescriptions prior to admission  Medication Sig Dispense Refill Last Dose  .  ceFEPIme 1 g in dextrose 5 % 50 mL Inject 1 g into the vein every 8 (eight) hours.     Marland Kitchen escitalopram (LEXAPRO) 20 MG tablet Take 20 mg by mouth daily.     . famotidine (PEPCID) 20 MG tablet 20 mg by PEG Tube route 2 (two) times daily.     . ferrous sulfate 325 (65 FE) MG tablet Take 325 mg by mouth daily with breakfast.     . hydrALAZINE (APRESOLINE) 25 MG tablet Take 75 mg by mouth 3 (three) times daily.     Marland Kitchen HYDROcodone-acetaminophen (NORCO/VICODIN) 5-325 MG tablet Take 1 tablet by mouth every 6 (six) hours as needed for moderate pain.     Marland Kitchen ipratropium-albuterol (DUONEB) 0.5-2.5 (3) MG/3ML SOLN Take 3 mLs by nebulization every 6 (six) hours.     . metoprolol tartrate (LOPRESSOR) 25 MG tablet Take 25 mg by mouth 3 (three) times daily.     . traZODone (DESYREL) 50 MG tablet Take 50 mg by mouth at bedtime.     . Valproate Sodium (VALPROIC ACID) 250 MG/5ML SOLN Take 2.5 mLs by mouth 2 (two) times daily.     . vancomycin 1,000 mg in sodium chloride 0.9 % 250 mL Inject 1,000 mg into the vein daily.     . ciprofloxacin (  CIPRO) 500 MG tablet Take 1 tablet (500 mg total) by mouth every 12 (twelve) hours. 14 tablet 0     Assessment:  Pt with anemia of CKD SCr 4.6 > 3.6, eCrCl < 20 ml/min, No iron panel on file. Hgb currently 3.8, unable to transfuse due to antibodies in blood.  Goal: hgb > 11 gm/dl  Plan:  -Aranesp 60 mcg SQ once per week -Watch hgb, usually takes several days to one week to see maximum effect -Consider ordering iron panel   Julus Kelley, [place] 09/21/2015,2:42 PM

## 2015-09-21 NOTE — Progress Notes (Signed)
Pt transported to 2C05 from ED on vent without incident. Report given to receiving RT.

## 2015-09-21 NOTE — Progress Notes (Addendum)
This is an attestation to history and physical by NP Hoffman.   ATTENDING NOTE: I have personally reviewed patient's available data, including medical history, events of note, physical examination and test results as part of my evaluation. I have discussed with resident/NP and other careteam providers such as pharmacist, RN and RRT & co-ordinated with consultants.  64 year old woman from [facility], ventilator dependence at least for the past 3 months, bedbound with CVA sacral decubitus, right pleural effusion with Pleurx catheter and left hydronephrosis with nephrostomy 06/2015. Course has been complicated by Freda Munro minus pneumonia and funguria. She also has chronic anemia and has required frequent blood transfusions. She is transferred since it is difficult to find blood for with a hemoglobin of 4.2 and hypotension  On exam-chronically ill, eyes open but does not follow commands, anasarca, size 7 tracheostomy, decreased breath sounds bilateral, height Pleurx catheter appears to/from suture, hematuria and Foley, PEG aspirate nonbloody  Labs show-severe anemia with hemoglobin of 5, normocytic, severe prerenal failure with BUN to 38 and creatinine 4.6, normal electrolytes  Chest x-ray shows widened mediastinum, but this appears to be chronic, Pleurx catheter appears to be in position Impression/plan-  Hypotension-responded to one unit blood transfusion, may be related to acute on chronic anemia, cannot rule out septic shock given fever and mild leukocytosis, of note lactate normal  We will put her on broad-spectrum antibiotics-with meropenem and vancomycin to cover Pseudomonas and MRSA while awaiting panculture  She has a PICC line that was placed on 08/11/15, we'll transduce for CVP, doubt that she would need pressors  Anemia- may be related to hematuria, she has several autoantibodies and is difficult to crossmatch, we will check LDH for hemolysis and haptoglobin  AKI- appears prerenal,  diuretic has been stopped, given anasarca did not want to hydrate. Will let BUN equilibriate, she is not a candidate for dialysis  [facility] consult palliative care in a.m.   Rest per NP/medical resident whose note is outlined above and that I agree with and edited in full.   The patient is critically ill with multiple organ systems failure and requires high complexity decision making for assessment and support, frequent evaluation and titration of therapies, application of advanced monitoring technologies and extensive interpretation of multiple databases. Critical Care Time devoted to patient care services described in this note independent of APP time is 45 minutes.   Kara Mead MD. [place]ade Flood. [place] Pulmonary & Critical care Pager [phone] If no response call [phone]   09/21/2015

## 2015-09-21 NOTE — Progress Notes (Signed)
Pharmacy Antibiotic Note  Jacqueline Fuentes is a 65 y.o. female admitted on 09/20/2015 with sepsis w/ recent Pseudomonas.  Pharmacy has been consulted for Merrem and Vancocin dosing.  Plan: Vanc 2g IV and Zosyn 3.375g IV in ED. Vancomycin 1750 IV every 48 hours.  Goal trough 15-20 mcg/mL.  Merrem 500mg  IV Q12H.   Temp (24hrs), Avg:100.1 F (37.8 C), Min:99.4 F (37.4 C), Max:100.7 F (38.2 C)   Recent Labs Lab 09/20/15 2306 09/20/15 2319  WBC 12.6*  --   CREATININE 4.63*  --   LATICACIDVEN  --  1.95    Estimated Creatinine Clearance: 16.9 mL/min (by C-G formula based on Cr of 4.63).    Allergies  Allergen Reactions  . Aspirin   . Heparin   . Lorazepam      Thank you for allowing pharmacy to be a part of this patient's care.  Wynona Neat, PharmD, BCPS  09/21/2015 12:53 AM

## 2015-09-21 NOTE — Consult Note (Addendum)
WOC wound consult note Reason for Consult: Consult requested for sacrum and right leg and heel. Pt has multiple systemic factors which can impair healing and is critically ill. Wound type: Unstageable pressure injury to right inner heel 2X3cm, 100% dry eschar without odor, drainage, or fluctuance Right outer foot with dark purple-red deep tissue injury; 3X1cm Inner ankle with partial thickness wound; .2X.2X.1cm, pink and moist, no odor or drainage. Right posterior leg with full thickness wound; 1X1X.1cm, red and moist, small amt red drainage, no odor. Right posterior calf with unstageable pressure injury; 12X4cm, 100% dry eschar without odor, drainage, or fluctuance Sacrum with stage 4 pressure injury; [id number] with undermining to wound edges to 5.5cm, large amt tan foul drainage, 40% yellow slough, 45% black eschar, 15% red.  Bone palpable, patchy areas of red macerated partial thickness skin loss to surrounding buttocks, appearance consistent with moisture associated skin damage. Pressure Ulcer POA: Yes Dressing procedure/placement/frequency: If aggressive plan of care is desired, then please order hydrotherapy daily to be performed by physicial therapy to assist with removal of nonviable tissue.  Santyl ointment for enzymatic debridement.  Air mattress ordered to reduce pressure and increase airflow to the affected areas on sacrum/buttocks.  It is best practice to leave dry stable eschar intact to right heel and leg, protect from further injury with foam dressing and reduce pressure to BLE with protective boots. Nutrition consult to optimize protein intake. No family present to discuss plan of care.  Please re-consult if further assistance is needed.  Thank-you,  Julien Girt MSN, [place], [place], [place], [place]

## 2015-09-22 ENCOUNTER — Encounter (HOSPITAL_COMMUNITY): Payer: Self-pay | Admitting: *Deleted

## 2015-09-22 DIAGNOSIS — L8990 Pressure ulcer of unspecified site, unspecified stage: Secondary | ICD-10-CM

## 2015-09-22 DIAGNOSIS — D62 Acute posthemorrhagic anemia: Secondary | ICD-10-CM | POA: Diagnosis not present

## 2015-09-22 DIAGNOSIS — R6520 Severe sepsis without septic shock: Secondary | ICD-10-CM

## 2015-09-22 DIAGNOSIS — A419 Sepsis, unspecified organism: Secondary | ICD-10-CM | POA: Diagnosis not present

## 2015-09-22 DIAGNOSIS — J189 Pneumonia, unspecified organism: Secondary | ICD-10-CM | POA: Diagnosis not present

## 2015-09-22 DIAGNOSIS — D649 Anemia, unspecified: Secondary | ICD-10-CM | POA: Diagnosis present

## 2015-09-22 LAB — PREPARE RBC (CROSSMATCH)

## 2015-09-22 LAB — PHOSPHORUS: Phosphorus: 4 mg/dL (ref 2.5–4.6)

## 2015-09-22 LAB — COMPREHENSIVE METABOLIC PANEL
ALT: 16 U/L (ref 14–54)
AST: 20 U/L (ref 15–41)
Albumin: 1.1 g/dL — ABNORMAL LOW (ref 3.5–5.0)
Alkaline Phosphatase: 96 U/L (ref 38–126)
Anion gap: 14 (ref 5–15)
BILIRUBIN TOTAL: 0.7 mg/dL (ref 0.3–1.2)
BUN: 216 mg/dL — AB (ref 6–20)
CHLORIDE: 99 mmol/L — AB (ref 101–111)
CO2: 29 mmol/L (ref 22–32)
CREATININE: 4.63 mg/dL — AB (ref 0.44–1.00)
Calcium: 7.9 mg/dL — ABNORMAL LOW (ref 8.9–10.3)
GFR calc Af Amer: 11 mL/min — ABNORMAL LOW (ref >60)
GFR, EST NON AFRICAN AMERICAN: 9 mL/min — AB (ref >60)
Glucose, Bld: 87 mg/dL (ref 65–99)
Potassium: 4.7 mmol/L (ref 3.5–5.1)
Sodium: 142 mmol/L (ref 135–145)
Total Protein: 6.4 g/dL — ABNORMAL LOW (ref 6.5–8.1)

## 2015-09-22 LAB — CBC
HEMATOCRIT: 20 % — AB (ref 36.0–46.0)
HEMATOCRIT: 22.9 % — AB (ref 36.0–46.0)
HEMOGLOBIN: 6.7 g/dL — AB (ref 12.0–15.0)
Hemoglobin: 7.1 g/dL — ABNORMAL LOW (ref 12.0–15.0)
MCH: 28.3 pg (ref 26.0–34.0)
MCH: 25.9 pg — ABNORMAL LOW (ref 26.0–34.0)
MCHC: 33.5 g/dL (ref 30.0–36.0)
MCHC: 31 g/dL (ref 30.0–36.0)
MCV: 84.4 fL (ref 78.0–100.0)
MCV: 83.6 fL (ref 78.0–100.0)
PLATELETS: 119 10*3/uL — AB (ref 150–400)
PLATELETS: 120 10*3/uL — AB (ref 150–400)
RBC: 2.37 MIL/uL — AB (ref 3.87–5.11)
RBC: 2.74 MIL/uL — ABNORMAL LOW (ref 3.87–5.11)
RDW: 17.7 % — ABNORMAL HIGH (ref 11.5–15.5)
RDW: 17.5 % — AB (ref 11.5–15.5)
WBC: 12.3 10*3/uL — AB (ref 4.0–10.5)
WBC: 11.1 10*3/uL — ABNORMAL HIGH (ref 4.0–10.5)

## 2015-09-22 LAB — HAPTOGLOBIN: Haptoglobin: 224 mg/dL — ABNORMAL HIGH (ref 34–200)

## 2015-09-22 LAB — MAGNESIUM: MAGNESIUM: 2.7 mg/dL — AB (ref 1.7–2.4)

## 2015-09-22 LAB — URINE CULTURE: Culture: NO GROWTH

## 2015-09-22 LAB — GLUCOSE, CAPILLARY
GLUCOSE-CAPILLARY: 77 mg/dL (ref 65–99)
GLUCOSE-CAPILLARY: 84 mg/dL (ref 65–99)
GLUCOSE-CAPILLARY: 84 mg/dL (ref 65–99)
GLUCOSE-CAPILLARY: 85 mg/dL (ref 65–99)
Glucose-Capillary: 90 mg/dL (ref 65–99)
Glucose-Capillary: 71 mg/dL (ref 65–99)
Glucose-Capillary: 81 mg/dL (ref 65–99)
Glucose-Capillary: 98 mg/dL (ref 65–99)

## 2015-09-22 MED ORDER — SODIUM CHLORIDE 0.9 % IV SOLN
Freq: Once | INTRAVENOUS | Status: AC
Start: 2015-09-22 — End: 2015-09-22
  Administered 2015-09-22: 07:00:00 via INTRAVENOUS

## 2015-09-22 MED ORDER — VITAL AF 1.2 CAL PO LIQD
1000.0000 mL | ORAL | Status: DC
Start: 2015-09-22 — End: 2015-09-22
  Administered 2015-09-22: 1000 mL
  Filled 2015-09-22: qty 1000

## 2015-09-22 MED ORDER — VITAL AF 1.2 CAL PO LIQD
1000.0000 mL | ORAL | Status: DC
  Administered 2015-09-23: 1000 mL
  Filled 2015-09-22 (×3): qty 1000

## 2015-09-22 MED ORDER — INSULIN ASPART 100 UNIT/ML ~~LOC~~ SOLN: 0.0000 [IU] | Freq: Three times a day (TID) | SUBCUTANEOUS | Status: DC

## 2015-09-22 MED ORDER — DOBUTAMINE INFUSION FOR EP/ECHO/NUC (1000 MCG/ML)
0.0000 ug/kg/min | INTRAVENOUS | Status: DC
  Filled 2015-09-22: qty 250

## 2015-09-22 NOTE — Progress Notes (Signed)
[facility] Progress Note Patient Name: Jacqueline Fuentes DOB: 12-04-1950 MRN: [id number]   Date of Service  09/22/2015  HPI/Events of Note  Repeat Hgb 6.7 up from 3.8.  Had received 2 units of pRBC  eICU Interventions  Plan: Transfuse 1 unit pRBC Post-transfusion CBC     Intervention Category Intermediate Interventions: Other:  Keahi Mccarney 09/22/2015, 6:03 AM

## 2015-09-22 NOTE — Clinical Documentation Improvement (Signed)
Critical Care Pulmonology  Can the diagnosis of Shock be further specified if known? Please document findings in next progress note.   Hypovolemic/Hemorrhagic Shock - Hgb was 5.0 on admit, dropped to 3.8 -> transfused -> 6.7  Possible Septic Shock - febrile, hypotensive with Maps in mid 60's, lactate 1.95 but was on antibiotics prior to admission, WBC = 12.6 with multiple possible sources of infection  Other Type of Shock  Clinically Undetermined  Document any associated diagnoses/conditions.  Supporting Information:  As above  Please exercise your independent, professional judgment when responding. A specific answer is not anticipated or expected.  Thank You,  Zoila Shutter RN, BSN, [place] [phone]; Cell: [phone]

## 2015-09-22 NOTE — Progress Notes (Signed)
MEDICATION RELATED NOTE  Pharmacy Re:  Home Meds  Allergies  Allergen Reactions  . Aspirin   . Heparin   . Lorazepam    Medications:  Prescriptions prior to admission  Medication Sig Dispense Refill Last Dose  . ceFEPIme 1 g in dextrose 5 % 50 mL Inject 1 g into the vein every 8 (eight) hours.     Marland Kitchen escitalopram (LEXAPRO) 20 MG tablet Take 20 mg by mouth daily.     . famotidine (PEPCID) 20 MG tablet 20 mg by PEG Tube route 2 (two) times daily.     . ferrous sulfate 325 (65 FE) MG tablet Take 325 mg by mouth daily with breakfast.     . hydrALAZINE (APRESOLINE) 25 MG tablet Take 75 mg by mouth 3 (three) times daily.     Marland Kitchen HYDROcodone-acetaminophen (NORCO/VICODIN) 5-325 MG tablet Take 1 tablet by mouth every 6 (six) hours as needed for moderate pain.     Marland Kitchen ipratropium-albuterol (DUONEB) 0.5-2.5 (3) MG/3ML SOLN Take 3 mLs by nebulization every 6 (six) hours.     . metoprolol tartrate (LOPRESSOR) 25 MG tablet Take 25 mg by mouth 3 (three) times daily.     . traZODone (DESYREL) 50 MG tablet Take 50 mg by mouth at bedtime.     . Valproate Sodium (VALPROIC ACID) 250 MG/5ML SOLN Take 2.5 mLs by mouth 2 (two) times daily.     . vancomycin 1,000 mg in sodium chloride 0.9 % 250 mL Inject 1,000 mg into the vein daily.     . ciprofloxacin (CIPRO) 500 MG tablet Take 1 tablet (500 mg total) by mouth every 12 (twelve) hours. 14 tablet 0    Assessment: Above meds include medications patient may have been on at [place] and at home.  Her medication list cannot be confirmed with patient.  Please resume medications you feel appropriate for her therapy.  Plan:  - Will mark medication history as complete without last doses and if the medications are current since we cannot verify.  Rober Minion, PharmD., MS Clinical Pharmacist Pager:  [phone] Thank you for allowing pharmacy to be part of this patients care team. 09/22/2015,8:40 AM

## 2015-09-22 NOTE — Progress Notes (Signed)
I still have not been able to make contact with family. I still have not received a return call from messages that I have left with her reported POA's wanting to talk about [name]'s situation.  I stopped by several times and no visitors present throughout the day today. It is my understanding from talking with nursing that they do not live locally and is difficult for them to come to visit.  I am hopeful that, with tomorrow being the weekend, I will have better success at getting in touch with family in order to discuss goals moving forward.  She has multiple medical problems that are not going to improve and the very grim prognosis. Unfortunately, I am not sure how else to be of assistance in the care of Jacqueline Fuentes at this time.     Micheline Rough, MD [place] Team [phone]

## 2015-09-22 NOTE — Progress Notes (Signed)
PULMONARY / CRITICAL CARE MEDICINE   Name: Jacqueline Fuentes MRN: [place]:[id number] DOB: 03-15-1951    ADMISSION DATE:  09/20/2015 CONSULTATION DATE:  09/20/2015  REFERRING MD:  EDP  CHIEF COMPLAINT:  Anemia, shock REVIEW OF SYSTEMS:   Unable  DISCUSSION: 65 year old unfortunate female. Vent[facility]trach dependent from [facility]. Presented 3/15 with anemia and hypotension. Unable to transfuse effectively at [facility] due to multiple antibodies in blood. Could be just chronic anemia, but concern for GIB or hematuria.  SUBJECTIVE:  Patient remains obtunded with minimum response to painful stimuli. Blood has been given with pre-treatment, has tolerated  VITAL SIGNS: BP 119/72 mmHg  Pulse 72  Temp(Src) 99 F (37.2 C) (Axillary)  Resp 22  Ht 5\' 8"  (1.727 m)  Wt 220 lb (99.791 kg)  BMI 33.46 kg/m2  SpO2 100%  HEMODYNAMICS: CVP:  [17 mmHg-20 mmHg] 20 mmHg  VENTILATOR SETTINGS: Vent Mode:  [-] PCV FiO2 (%):  [40 %] 40 % Set Rate:  [18 bmp] 18 bmp PEEP:  [5 cmH20] 5 cmH20 Plateau Pressure:  [27 cmH20-30 cmH20] 28 cmH20  INTAKE / OUTPUT: I/O last 3 completed shifts: In: 1590 [I.V.:100; Blood:730; IV Piggyback:760] Out: 2200 [Urine:2200]  PHYSICAL EXAMINATION: General:  Obese, chronically ill appearing female on  Vent via trach Neuro:  Unresponsive, does not follow commands, facial grimace only to painful stimuli. Contracted  HEENT:  Atraumatic, normocephalic, no discharge. 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   LABS:  BMET  Recent Labs Lab 09/20/15 2306 09/21/15 0525 09/22/15 0512  NA 139 144 142  K 4.1 2.9* 4.7  CL 95* 107 99*  CO2 30 26 29   BUN 238* 181* 216*  CREATININE 4.63* 3.69* 4.63*  GLUCOSE 131* 117* 87     Electrolytes  Recent Labs Lab 09/20/15 2306 09/21/15 0525 09/22/15 0512  CALCIUM 7.8* 6.1* 7.9*  MG  --   --  2.7*  PHOS  --   --  4.0    CBC  Recent Labs Lab 09/20/15 2306 09/21/15 0525 09/22/15 0512  WBC 12.6* 9.6 12.3*  HGB 5.0* 3.8* 6.7*  HCT 16.1* 12.0* 20.0*  PLT 136* 94* 119*    Coag's  Recent Labs Lab 09/21/15 0133  INR 1.59*    Sepsis Markers  Recent Labs Lab 09/20/15 2319 09/21/15 0133  LATICACIDVEN 1.95 0.99    ABG  Recent Labs Lab 09/21/15 0132  PHART 7.426  PCO2ART 51.0*  PO2ART 58.0*    Liver Enzymes  Recent Labs Lab 09/20/15 2306 09/22/15 0512  AST 28 20  ALT 18 16  ALKPHOS 103 96  BILITOT 0.4 0.7  ALBUMIN 1.2* 1.1*    Cardiac Enzymes No results for input(s): TROPONINI, PROBNP in the last 168 hours.  Glucose  Recent Labs Lab 09/21/15 1319 09/21/15 1735 09/21/15 2037 09/22/15 0029 09/22/15 0428 09/22/15 0848  GLUCAP 96 90 85 84 84 77    Imaging No results found.  STUDIES:  none  CULTURES: BCx2 3/16 > Urine 3/16 > Tracheal aspirate 3/16 >  ANTIBIOTICS: Meropenem 3/16 > Vancomycin 3/16 >  SIGNIFICANT EVENTS: 3/16 transfer from [facility] to [facility] for anemia/hypotension  LINES/TUBES: Trach #7 cuffed >> RUE PICC 2/3 >>> Rt chest tube>>   ASSESSMENT / PLAN:  PULMONARY A:  Chronic respiratory failure with ventilator dependence Chronic R effusion with PleurX, appears dislodged from suture Wide mediastinum  P:   Full vent support Vent bundle Duoneb PRN Follow up CXR Change bulb on chest tube ? CT chest but little to offer for interventions, therefore not ordered.  CARDIOVASCULAR A:  Hypotension secondary to ABLA Atrial Fibrillation Acute on chronic diastolic CHF P:  Telemetry monitoring Transfuse PRBC as able to obtain Hold home antihypertensive medications (hydralazine, metoprolol)  RENAL Lab Results  Component Value Date   CREATININE 4.63* 09/22/2015   CREATININE 3.69*  09/21/2015   CREATININE 4.63* 09/20/2015    Recent Labs Lab 09/20/15 2306 09/21/15 0525 09/22/15 0512  K 4.1 2.9* 4.7     Recent Labs Lab 09/20/15 2306 09/21/15 0525 09/22/15 0512  NA 139 144 142   A:   Acute on CKD - appears prerenal L hydronephrosis with nephrostomy tube Chronic indwelling foley catheter Hypokalemia Hypocalcemia P:   Follow BMP  Saline lock Not a dialysis candidate Replace lytes  GASTROINTESTINAL A:   GERD Possible GIB Severe protein calorie malnutrition  P:   Start tube feeds Continue Protonix TF's running  HEMATOLOGIC  Recent Labs  09/21/15 0525 09/22/15 0512  HGB 3.8* 6.7*    A:   Acute blood loss on anemia of chronic illness. Possibly from GIB, hematuria P:  Transfuse 2 units PRBC, plus 1 more/difficult to match blood but source in [facility] found Serial CBC ? Return to [facility]. No real interventions available   INFECTIOUS A:   Possible sepsis, source unclear consider PNA, UTI, Sacral wound, or any number of chronic indwelling lines/tubes. P:   Continue mero, vanc Trend WBC and fever curves Follow Blood, urine and sputum culture Wound care consult to evaluate pressure ulcer  ENDOCRINE CBG (last 3)   Recent Labs  09/22/15 0029 09/22/15 0428 09/22/15 0848  GLUCAP 84 84 77     A: No acute issues P:   Trend Glucose  NEUROLOGIC A:   Seizure disorder Anxiety/depression Chronic pain H/o several CVA P:   RASS goal: 0 PRN fentanyl Continue valproic acid,  Continue to hold lexapro, trazodone     Jacqueline Fuentes Minor ACNP Maryanna Shape PCCM Pager [phone] till 3 pm If no answer page [phone] 09/22/2015, 9:56 AM    Attending Note:  I have examined patient, reviewed labs, studies and notes. I have discussed the case with S Minor, and I agree with the data and plans as amended above. Very grim picture. ? Whether her blood loss is from hematuria. She is not a HD candidate. 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 Appreciate  palliative care help.   Baltazar Apo, MD, PhD 09/22/2015, 1:51 PM [facility] [phone] or if no answer [phone]

## 2015-09-23 DIAGNOSIS — Z7189 Other specified counseling: Secondary | ICD-10-CM

## 2015-09-23 DIAGNOSIS — Z515 Encounter for palliative care: Secondary | ICD-10-CM | POA: Diagnosis not present

## 2015-09-23 DIAGNOSIS — N179 Acute kidney failure, unspecified: Secondary | ICD-10-CM | POA: Diagnosis not present

## 2015-09-23 DIAGNOSIS — D62 Acute posthemorrhagic anemia: Secondary | ICD-10-CM | POA: Diagnosis not present

## 2015-09-23 DIAGNOSIS — Z9911 Dependence on respirator [ventilator] status: Secondary | ICD-10-CM | POA: Diagnosis not present

## 2015-09-23 DIAGNOSIS — J9621 Acute and chronic respiratory failure with hypoxia: Secondary | ICD-10-CM

## 2015-09-23 DIAGNOSIS — J189 Pneumonia, unspecified organism: Secondary | ICD-10-CM | POA: Diagnosis not present

## 2015-09-23 DIAGNOSIS — A419 Sepsis, unspecified organism: Secondary | ICD-10-CM | POA: Diagnosis not present

## 2015-09-23 LAB — TYPE AND SCREEN
ABO/RH(D): O POS
Antibody Screen: POSITIVE
DAT, IgG: NEGATIVE
Unit division: 0
Unit division: 0
Unit division: 0

## 2015-09-23 LAB — BASIC METABOLIC PANEL
Anion gap: 14 (ref 5–15)
BUN: 201 mg/dL — ABNORMAL HIGH (ref 6–20)
CALCIUM: 8.1 mg/dL — AB (ref 8.9–10.3)
CHLORIDE: 103 mmol/L (ref 101–111)
CO2: 30 mmol/L (ref 22–32)
CREATININE: 4.33 mg/dL — AB (ref 0.44–1.00)
GFR calc Af Amer: 11 mL/min — ABNORMAL LOW (ref >60)
GFR calc non Af Amer: 10 mL/min — ABNORMAL LOW (ref >60)
GLUCOSE: 114 mg/dL — AB (ref 65–99)
Potassium: 4.3 mmol/L (ref 3.5–5.1)
Sodium: 147 mmol/L — ABNORMAL HIGH (ref 135–145)

## 2015-09-23 LAB — CBC
HEMATOCRIT: 23.5 % — AB (ref 36.0–46.0)
Hemoglobin: 7.3 g/dL — ABNORMAL LOW (ref 12.0–15.0)
MCH: 26.1 pg (ref 26.0–34.0)
MCHC: 31.1 g/dL (ref 30.0–36.0)
MCV: 83.9 fL (ref 78.0–100.0)
Platelets: 127 10*3/uL — ABNORMAL LOW (ref 150–400)
RBC: 2.8 MIL/uL — ABNORMAL LOW (ref 3.87–5.11)
RDW: 17.8 % — AB (ref 11.5–15.5)
WBC: 11 10*3/uL — ABNORMAL HIGH (ref 4.0–10.5)

## 2015-09-23 LAB — GLUCOSE, CAPILLARY
GLUCOSE-CAPILLARY: 140 mg/dL — AB (ref 65–99)
GLUCOSE-CAPILLARY: 129 mg/dL — AB (ref 65–99)
GLUCOSE-CAPILLARY: 144 mg/dL — AB (ref 65–99)
Glucose-Capillary: 112 mg/dL — ABNORMAL HIGH (ref 65–99)
Glucose-Capillary: 115 mg/dL — ABNORMAL HIGH (ref 65–99)
Glucose-Capillary: 117 mg/dL — ABNORMAL HIGH (ref 65–99)

## 2015-09-23 LAB — VANCOMYCIN, RANDOM: Vancomycin Rm: 33 ug/mL

## 2015-09-23 MED ORDER — VITAL AF 1.2 CAL PO LIQD: 1000.0000 mL | ORAL | Status: DC

## 2015-09-23 MED ORDER — INSULIN ASPART 100 UNIT/ML ~~LOC~~ SOLN
0.0000 [IU] | Freq: Three times a day (TID) | SUBCUTANEOUS | Status: DC
  Administered 2015-09-23 – 2015-09-27 (×10): 1 [IU] via SUBCUTANEOUS
  Administered 2015-09-27 – 2015-10-01 (×3): 0 [IU] via SUBCUTANEOUS
  Administered 2015-10-01: 1 [IU] via SUBCUTANEOUS

## 2015-09-23 MED ORDER — VITAL AF 1.2 CAL PO LIQD
1000.0000 mL | ORAL | Status: DC
Start: 2015-09-23 — End: 2015-10-03
  Administered 2015-09-24 – 2015-10-03 (×8): 1000 mL
  Filled 2015-09-23 (×15): qty 1000

## 2015-09-23 NOTE — Progress Notes (Signed)
PULMONARY / CRITICAL CARE MEDICINE   Name: Jacqueline Fuentes MRN: [place]:[id number] DOB: April 27, 1951    ADMISSION DATE:  09/20/2015 CONSULTATION DATE:  09/20/2015  REFERRING MD:  EDP  CHIEF COMPLAINT:  Anemia, shock REVIEW OF SYSTEMS:   Unable  DISCUSSION: 65 year old unfortunate female. [facility] dependent from [facility]. Presented 3/15 with anemia and hypotension. Unable to transfuse effectively at [facility] due to multiple antibodies in blood. Could be just chronic anemia, but concern for GIB or hematuria.  SUBJECTIVE:  Patient remains obtunded with minimum response to painful stimuli. Blood has been given with pre-treatment, has tolerated  VITAL SIGNS: BP 151/69 mmHg  Pulse 82  Temp(Src) 101.1 F (38.4 C) (Oral)  Resp 21  Ht 5\' 8"  (1.727 m)  Wt 224 lb (101.606 kg)  BMI 34.07 kg/m2  SpO2 100%  HEMODYNAMICS:    VENTILATOR SETTINGS: Vent Mode:  [-] PCV FiO2 (%):  [40 %] 40 % Set Rate:  [18 bmp] 18 bmp Vt Set:  [450 mL] 450 mL PEEP:  [5 cmH20] 5 cmH20 Plateau Pressure:  [22 cmH20-30 cmH20] 30 cmH20  INTAKE / OUTPUT: I/O last 3 completed shifts: In: 2349.3 [I.V.:207.2; Blood:1005; Other:190; NG/GT:797.2; IV Piggyback:150] Out: [id number] [Urine:3175]  PHYSICAL EXAMINATION: General:  Obese, chronically ill appearing female on  Vent via trach Neuro:  Unresponsive, does not follow commands, facial grimace only to painful stimuli. Contracted  HEENT:  Atraumatic, normocephalic, no discharge. 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   LABS:  BMET  Recent Labs Lab 09/21/15 0525 09/22/15 0512 09/23/15 0540  NA 144 142 147*  K 2.9* 4.7 4.3  CL 107 99* 103  CO2 26 29 30   BUN 181* 216* 201*  CREATININE 3.69* 4.63* 4.33*   GLUCOSE 117* 87 114*    Electrolytes  Recent Labs Lab 09/21/15 0525 09/22/15 0512 09/23/15 0540  CALCIUM 6.1* 7.9* 8.1*  MG  --  2.7*  --   PHOS  --  4.0  --     CBC  Recent Labs Lab 09/22/15 0512 09/22/15 1324 09/23/15 0540  WBC 12.3* 11.1* 11.0*  HGB 6.7* 7.1* 7.3*  HCT 20.0* 22.9* 23.5*  PLT 119* 120* 127*    Coag's  Recent Labs Lab 09/21/15 0133  INR 1.59*    Sepsis Markers  Recent Labs Lab 09/20/15 2319 09/21/15 0133  LATICACIDVEN 1.95 0.99    ABG  Recent Labs Lab 09/21/15 0132  PHART 7.426  PCO2ART 51.0*  PO2ART 58.0*    Liver Enzymes  Recent Labs Lab 09/20/15 2306 09/22/15 0512  AST 28 20  ALT 18 16  ALKPHOS 103 96  BILITOT 0.4 0.7  ALBUMIN 1.2* 1.1*    Cardiac Enzymes No results for input(s): TROPONINI, PROBNP in the last 168 hours.  Glucose  Recent Labs Lab 09/22/15 1732 09/22/15 2015 09/23/15 0057 09/23/15 0424 09/23/15 0820 09/23/15 1412  GLUCAP 81 98 112* 117* 115* 140*    Imaging No results found.  STUDIES:  none  CULTURES: BCx2 3/16 > Urine 3/16 >neg Tracheal aspirate 3/16 >  ANTIBIOTICS: Meropenem 3/16 > Vancomycin 3/16 >  SIGNIFICANT EVENTS: 3/16 transfer from [facility] to [facility] for anemia/hypotension  LINES/TUBES: Trach #7 cuffed >> RUE PICC 2/3 >>> Rt chest tube>>   ASSESSMENT /  PLAN:  PULMONARY A: Chronic respiratory failure with ventilator dependence Chronic R effusion with PleurX Wide mediastinum  P:   Full vent support Vent bundle Duoneb PRN Follow up CXR Change bulb on chest tube ? CT chest but little to offer for interventions, therefore not ordered.  CARDIOVASCULAR A:  Hypotension secondary to ABLA Atrial Fibrillation Acute on chronic diastolic CHF P:  Telemetry monitoring Transfuse PRBC as able to obtain Hold home antihypertensive medications (hydralazine, metoprolol)  RENAL Lab Results  Component Value Date   CREATININE 4.33* 09/23/2015   CREATININE 4.63*  09/22/2015   CREATININE 3.69* 09/21/2015    Recent Labs Lab 09/21/15 0525 09/22/15 0512 09/23/15 0540  K 2.9* 4.7 4.3     Recent Labs Lab 09/21/15 0525 09/22/15 0512 09/23/15 0540  NA 144 142 147*   A:   Acute on CKD - appears prerenal L hydronephrosis with nephrostomy tube Chronic indwelling foley catheter Hypokalemia Hypocalcemia P:   Follow BMP  Saline lock Not a dialysis candidate Replace lytes  GASTROINTESTINAL A:   GERD Possible GIB Severe protein calorie malnutrition  P:   Start tube feeds Continue Protonix TF's running  HEMATOLOGIC  Recent Labs  09/22/15 1324 09/23/15 0540  HGB 7.1* 7.3*    A:   Acute blood loss on anemia of chronic illness. Possibly from GIB, hematuria P:  Transfuse 2 units PRBC, plus 1 more/difficult to match blood but source in [facility] found Serial CBC ? Return to [facility] soon. No real interventions available. Anemia treated  INFECTIOUS A:   Possible sepsis, source unclear consider PNA, UTI, Sacral wound, or any number of chronic indwelling lines/tubes. P:   Continue mero, vanc Trend WBC and fever curves Follow Blood, urine and sputum culture Wound care consult to evaluate pressure ulcer  ENDOCRINE CBG (last 3)   Recent Labs  09/23/15 0424 09/23/15 0820 09/23/15 1412  GLUCAP 117* 115* 140*     A: No acute issues P:   Trend Glucose  NEUROLOGIC A:   Seizure disorder Anxiety/depression Chronic pain H/o several CVA P:   RASS goal: 0 PRN fentanyl Continue valproic acid,  Continue to hold lexapro, trazodone     Richardson Landry Minor ACNP Maryanna Shape PCCM Pager [phone] till 3 pm If no answer page [phone] 09/23/2015, 4:16 PM   Attending Note:  I have examined patient, reviewed labs, studies and notes. I have discussed the case with S Minor, and I agree with the data and plans as amended above. Tolerated her transfusions. No significant change o/n. Suspect she can go back to [facility] next couple days if renal  fxn stabilizes.   Baltazar Apo, MD, PhD 09/23/2015, 5:26 PM [facility] [phone] or if no answer [phone]

## 2015-09-23 NOTE — Plan of Care (Signed)
Problem: Pain Managment: Goal: General experience of comfort will improve Outcome: Progressing Goal of meeting patients non verbal pain requirements

## 2015-09-23 NOTE — Progress Notes (Signed)
09/23/2015 Rn notified Dr Lamonte Sakai patient having temperature 101.3 orally and been having temperature since 0730 this am. Patient continue to receive Tylenol 650 mg  Per peg tube prn. [facility] RN.

## 2015-09-23 NOTE — Progress Notes (Signed)
Pharmacy Antibiotic Note  Jacqueline Fuentes is a 65 y.o. female admitted on 09/20/2015 with sepsis w/ recent Pseudomonas.  Pharmacy has been consulted for Merrem and vancomycin dosing. Pt was started on cefepime 1g q8h and vancomycin 1g q24h on 3/14 at [facility].  3/18 VR is 33 after receiving vancomycin 2g in the ED.  Plan: Hold vancomycin with elevated VR Merrem 500mg  IV q12h Monitor culture data, renal function and clinical course Vanc goal trough 15-20 mcg/mL Recheck VR 3/19 with AM labs    Temp (24hrs), Avg:100.1 F (37.8 C), Min:99.1 F (37.3 C), Max:101.3 F (38.5 C)   Recent Labs Lab 09/20/15 2306 09/20/15 2319 09/21/15 0133 09/21/15 0525 09/22/15 0512 09/22/15 1324 09/23/15 0235 09/23/15 0540  WBC 12.6*  --   --  9.6 12.3* 11.1*  --  11.0*  CREATININE 4.63*  --   --  3.69* 4.63*  --   --  4.33*  LATICACIDVEN  --  1.95 0.99  --   --   --   --   --   VANCORANDOM  --   --   --   --   --   --  33  --     Estimated Creatinine Clearance: 16.2 mL/min (by C-G formula based on Cr of 4.33).    Allergies  Allergen Reactions  . Aspirin   . Heparin   . Lorazepam       Andrey Cota. Diona Foley, PharmD, [place] Clinical Pharmacist Pager [phone] 09/23/2015 10:17 AM

## 2015-09-23 NOTE — Consult Note (Signed)
Consultation Note Date: 09/23/2015   Patient Name: Jacqueline Fuentes  DOB: May 20, 1951  MRN: [id number]  Age / Sex: 65 y.o., female  PCP: Verneita Griffes, MD Referring Physician: Rigoberto Noel, MD  Reason for Consultation: Establishing goals of care  Clinical Assessment/Narrative: 65 year old woman from [facility], ventilator dependence for the last 8 months, bedbound with CVA sacral decubitus, right pleural effusion with Pleurx catheter and left hydronephrosis with nephrostomy 06/2015. Course has been complicated by pneumonia, funguria, and chronic anemia that has required frequent blood transfusions. She was transferred from [facility] at family request due to low hemoglobin and hypotension. Palliative have consulted for goals of care.  I met today with patient's family. They report that the most important things to the patient are her family and this is been the case for very long time.  We spoke for a long time about her chronic condition and the fact that she has multiple comorbid medical conditions that are not going to improve. We talked about the fact that she now has been requiring frequent transfusions but has developed antibodies and so is very limited the actual blood products that she can receive as well as the fact that it may not be possible to even locate blood she can receive at some point in the future.  We also talked at length about her renal disease and the fact that she is not a candidate for long-term dialysis.  We discussed at length that the goal of medical therapy would be to enable her to spend quality time with her family as they state that this is what is most important to her.  Contacts/Participants in Discussion: Primary Decision Maker: Patient's husband and daughter Jacqueline Fuentes share [place].  Her husband is making decisions at this time.   Relationship to Patient husband HCPOA: Yes   SUMMARY OF  RECOMMENDATIONS - Her family reports understanding the severity of her condition as well as the fact that she is approaching the end of her life. - I talk with family at length about the goal of medical therapy being to allow her to spend time with family. - We talked at length about aggressive measures, including CPR in the event of cardiac arrest, only being effective if there is an underlying correctable problem and that with her chronic anemia (with limited ability to find compatible transfusions) and renal disease there is a high likelihood that she will develop cardiac arrest with an irreversible cause.  I advised that CPR would cause undue suffering without any potential benefit in this scenario. - Her husband is adamant that he will continue to request any and all measures for any available aggressive medical therapies moving forward.  He reports that he does not care if there have no realistic chance of being effective or adding meaningful time to her life. He states needing to know "everything" was tried regardless of chance of benefit. Several other members of her family became very tearful but stated that they respect his decision. - Plan remains continued full aggressive care including FULL CODE STATUS in the event of cardiac arrest or death.  After speaking with the family, I do not think this is something that will be changed at any point in the future. While her children and brother seem to be open to further discussion, her husband is adamant that this is something he will never reconsider. - I left a copy of [facility] with his family for review.  Code Status/Advance Care Planning: Full  code    Code Status Orders        Start     Ordered   09/21/15 0046  Full code   Continuous     09/21/15 0047    Code Status History    Date Active Date Inactive Code Status Order ID Comments User Context   This patient has a current code status but no historical code  status.      Palliative Prophylaxis:   Bowel Regimen, Delirium Protocol and Frequent Pain Assessment   Psycho-social/Spiritual:  Support System: [place] [employer] for further Chaplaincy support: Did not discuss Additional Recommendations: Caregiving  Support/Resources  Prognosis: Unable to determine due to complex medical situation.  Discharge Planning: Likely [facility] to [facility]   Chief Complaint/ Primary Diagnoses: Present on Admission:  . Acute blood loss anemia  I have reviewed the medical record, interviewed the patient and family, and examined the patient. The following aspects are pertinent.  Past Medical History  Diagnosis Date  . Ventilator dependent ([place])   . Atrial fibrillation ([place])   . Seizures ([place])   . Pleural effusion   . Renal disorder   . Stroke ([place])   . Quadriplegia, functional ([place])   . CHF (congestive heart failure) ([place])   . Hyperthyroidism   . Anemia   . GERD (gastroesophageal reflux disease)    Social History   Social History  . Marital Status: Married    Spouse Name: N/A  . Number of Children: N/A  . Years of Education: N/A   Social History Main Topics  . Smoking status: Never Smoker   . Smokeless tobacco: Never Used     Comment: ujnknown smoking history  . Alcohol Use: No  . Drug Use: None  . Sexual Activity: Not Asked   Other Topics Concern  . None   Social History Narrative   History reviewed. No pertinent family history. Scheduled Meds: . antiseptic oral rinse  7 mL Mouth Rinse QID  . chlorhexidine gluconate  15 mL Mouth Rinse BID  . collagenase   Topical Daily  . darbepoetin (ARANESP) injection - NON-DIALYSIS  60 mcg Subcutaneous Q Thu-1800  . insulin aspart  0-9 Units Subcutaneous TID WC  . meropenem (MERREM) IV  500 mg Intravenous Q12H  . pantoprazole (PROTONIX) IV  40 mg Intravenous Q12H  . sodium chloride flush  3 mL Intravenous Q12H  . valproic acid  125 mg Per Tube BID   Continuous Infusions: . feeding supplement  (VITAL AF 1.2 CAL)     PRN Meds:.sodium chloride, acetaminophen (TYLENOL) oral liquid 160 mg/5 mL, diphenhydrAMINE, fentaNYL (SUBLIMAZE) injection, ipratropium-albuterol, sodium chloride flush Medications Prior to Admission:  Prior to Admission medications   Medication Sig Start Date End Date Taking? Authorizing Provider  ceFEPIme 1 g in dextrose 5 % 50 mL Inject 1 g into the vein every 8 (eight) hours. 09/19/15 09/26/15 Yes Historical Provider, MD  escitalopram (LEXAPRO) 20 MG tablet Take 20 mg by mouth daily.   Yes Historical Provider, MD  famotidine (PEPCID) 20 MG tablet 20 mg by PEG Tube route 2 (two) times daily.   Yes Historical Provider, MD  ferrous sulfate 325 (65 FE) MG tablet Take 325 mg by mouth daily with breakfast.   Yes Historical Provider, MD  hydrALAZINE (APRESOLINE) 25 MG tablet Take 75 mg by mouth 3 (three) times daily.   Yes Historical Provider, MD  HYDROcodone-acetaminophen (NORCO/VICODIN) 5-325 MG tablet Take 1 tablet by mouth every 6 (six) hours as needed for moderate pain.  Yes Historical Provider, MD  ipratropium-albuterol (DUONEB) 0.5-2.5 (3) MG/3ML SOLN Take 3 mLs by nebulization every 6 (six) hours.   Yes Historical Provider, MD  metoprolol tartrate (LOPRESSOR) 25 MG tablet Take 25 mg by mouth 3 (three) times daily.   Yes Historical Provider, MD  traZODone (DESYREL) 50 MG tablet Take 50 mg by mouth at bedtime.   Yes Historical Provider, MD  Valproate Sodium (VALPROIC ACID) 250 MG/5ML SOLN Take 2.5 mLs by mouth 2 (two) times daily.   Yes Historical Provider, MD  vancomycin 1,000 mg in sodium chloride 0.9 % 250 mL Inject 1,000 mg into the vein daily. 09/19/15 09/26/15 Yes Historical Provider, MD  ciprofloxacin (CIPRO) 500 MG tablet Take 1 tablet (500 mg total) by mouth every 12 (twelve) hours. 06/30/15   Forde Dandy, MD   Allergies  Allergen Reactions  . Aspirin   . Heparin   . Lorazepam     Review of Systems Unable to obtain Physical Exam  General: Obese,  chronically ill appearing female on Vent via trach, occasional grimacing Neuro: Unresponsive, does not follow commands, facial grimace only to painful stimuli. Contracted  HEENT: Atraumatic, normocephalic Cardiovascular: S1, S2 intact, no MRG Lungs: Coarse bilateral, no use of accessory muscle use, right chest tube with scant drainage Abdomen: Soft, obese, non-distended Musculoskeletal: +3 edema to BUE/ anasarca  Vital Signs: BP 156/76 mmHg  Pulse 88  Temp(Src) 101.4 F (38.6 C) (Axillary)  Resp 0  Ht '5\' 8"'  (1.727 m)  Wt 101.606 kg (224 lb)  BMI 34.07 kg/m2  SpO2 100%  SpO2: SpO2: 100 % O2 Device:SpO2: 100 % O2 Flow Rate: .   IO: Intake/output summary:  Intake/Output Summary (Last 24 hours) at 09/23/15 2140 Last data filed at 09/23/15 1955  Gross per 24 hour  Intake   1483 ml  Output   1625 ml  Net   -142 ml    LBM: Last BM Date: 09/23/15 (incontinent) Baseline Weight: Weight: 124.7 kg (274 lb 14.6 oz) Most recent weight: Weight: 101.606 kg (224 lb)      Palliative Assessment/Data:  Flowsheet Rows        Most Recent Value   Intake Tab    Referral Department  Critical care   Unit at Time of Referral  Intermediate Care Unit   Palliative Care Primary Diagnosis  Neurology   Date Notified  09/21/15   Palliative Care Type  New Palliative care   Reason for referral  Clarify Goals of Care   Date of Admission  09/20/15   Date first seen by Palliative Care  09/22/15   # of days Palliative referral response time  1 Day(s)   # of days IP prior to Palliative referral  1   Clinical Assessment    Palliative Performance Scale Score  10%   Pain Max last 24 hours  Not able to report   Pain Min Last 24 hours  Not able to report   Psychosocial & Spiritual Assessment    Palliative Care Outcomes    Patient/Family meeting held?  Yes   Who was at the meeting?  Patient, husband, 5 daughters, and brother   Palliative Care Outcomes  Clarified goals of care      Additional  Data Reviewed:  CBC:    Component Value Date/Time   WBC 11.0* 09/23/2015 0540   HGB 7.3* 09/23/2015 0540   HCT 23.5* 09/23/2015 0540   PLT 127* 09/23/2015 0540   MCV 83.9 09/23/2015 0540   NEUTROABS 11.1* 09/20/2015  2306   LYMPHSABS 0.9 09/20/2015 2306   MONOABS 0.6 09/20/2015 2306   EOSABS 0.0 09/20/2015 2306   BASOSABS 0.0 09/20/2015 2306   Comprehensive Metabolic Panel:    Component Value Date/Time   NA 147* 09/23/2015 0540   K 4.3 09/23/2015 0540   CL 103 09/23/2015 0540   CO2 30 09/23/2015 0540   BUN 201* 09/23/2015 0540   CREATININE 4.33* 09/23/2015 0540   GLUCOSE 114* 09/23/2015 0540   CALCIUM 8.1* 09/23/2015 0540   AST 20 09/22/2015 0512   ALT 16 09/22/2015 0512   ALKPHOS 96 09/22/2015 0512   BILITOT 0.7 09/22/2015 0512   PROT 6.4* 09/22/2015 0512   ALBUMIN 1.1* 09/22/2015 0512     Time In: 1700 Time Out: 1825 Time Total: 85 Greater than 50%  of this time was spent counseling and coordinating care related to the above assessment and plan.  Signed by: Micheline Rough, MD  Micheline Rough, MD  09/23/2015, 9:40 PM  Please contact Palliative Medicine Team phone at [phone] for questions and concerns.

## 2015-09-24 ENCOUNTER — Inpatient Hospital Stay (HOSPITAL_COMMUNITY): Payer: Medicare Other

## 2015-09-24 DIAGNOSIS — L8990 Pressure ulcer of unspecified site, unspecified stage: Secondary | ICD-10-CM | POA: Diagnosis not present

## 2015-09-24 DIAGNOSIS — A419 Sepsis, unspecified organism: Secondary | ICD-10-CM | POA: Diagnosis not present

## 2015-09-24 DIAGNOSIS — J189 Pneumonia, unspecified organism: Secondary | ICD-10-CM | POA: Diagnosis not present

## 2015-09-24 DIAGNOSIS — D62 Acute posthemorrhagic anemia: Secondary | ICD-10-CM | POA: Diagnosis not present

## 2015-09-24 LAB — GLUCOSE, CAPILLARY
GLUCOSE-CAPILLARY: 132 mg/dL — AB (ref 65–99)
GLUCOSE-CAPILLARY: 157 mg/dL — AB (ref 65–99)
Glucose-Capillary: 136 mg/dL — ABNORMAL HIGH (ref 65–99)
Glucose-Capillary: 117 mg/dL — ABNORMAL HIGH (ref 65–99)
Glucose-Capillary: 108 mg/dL — ABNORMAL HIGH (ref 65–99)
Glucose-Capillary: 117 mg/dL — ABNORMAL HIGH (ref 65–99)

## 2015-09-24 LAB — BASIC METABOLIC PANEL
Anion gap: 9 (ref 5–15)
BUN: 195 mg/dL — AB (ref 6–20)
CO2: 30 mmol/L (ref 22–32)
CREATININE: 4.19 mg/dL — AB (ref 0.44–1.00)
Calcium: 8.1 mg/dL — ABNORMAL LOW (ref 8.9–10.3)
Chloride: 106 mmol/L (ref 101–111)
GFR, EST AFRICAN AMERICAN: 12 mL/min — AB (ref >60)
GFR, EST NON AFRICAN AMERICAN: 10 mL/min — AB (ref >60)
Glucose, Bld: 157 mg/dL — ABNORMAL HIGH (ref 65–99)
POTASSIUM: 3.9 mmol/L (ref 3.5–5.1)
SODIUM: 145 mmol/L (ref 135–145)

## 2015-09-24 LAB — CBC
HCT: 23 % — ABNORMAL LOW (ref 36.0–46.0)
HEMOGLOBIN: 7.2 g/dL — AB (ref 12.0–15.0)
MCH: 26.4 pg (ref 26.0–34.0)
MCHC: 31.3 g/dL (ref 30.0–36.0)
MCV: 84.2 fL (ref 78.0–100.0)
PLATELETS: 140 10*3/uL — AB (ref 150–400)
RBC: 2.73 MIL/uL — AB (ref 3.87–5.11)
RDW: 18 % — ABNORMAL HIGH (ref 11.5–15.5)
WBC: 10 10*3/uL (ref 4.0–10.5)

## 2015-09-24 LAB — VANCOMYCIN, RANDOM: VANCOMYCIN RM: 32 ug/mL

## 2015-09-24 NOTE — Progress Notes (Signed)
PULMONARY / CRITICAL CARE MEDICINE   Name: Jacqueline Fuentes MRN: [id number] DOB: 11-18-1950    ADMISSION DATE:  09/20/2015 CONSULTATION DATE:  09/20/2015  REFERRING MD:  EDP  CHIEF COMPLAINT:  Anemia, shock REVIEW OF SYSTEMS:   Unable  DISCUSSION: 65 year old unfortunate female. Vent/trach dependent from [facility]. Presented 3/15 with anemia and hypotension. Unable to transfuse effectively at [facility] due to multiple antibodies in blood. Could be just chronic anemia, but concern for GIB or hematuria.  SUBJECTIVE:   More interactive today, has nodded her head to questions  VITAL SIGNS: BP 170/72 mmHg  Pulse 83  Temp(Src) 98.9 F (37.2 C) (Axillary)  Resp 10  Ht 5\' 8"  (1.727 m)  Wt 101.152 kg (223 lb)  BMI 33.91 kg/m2  SpO2 100%  HEMODYNAMICS:    VENTILATOR SETTINGS: Vent Mode:  [-] PCV FiO2 (%):  [40 %] 40 % Set Rate:  [18 bmp] 18 bmp PEEP:  [5 cmH20] 5 cmH20 Plateau Pressure:  [26 cmH20-29 cmH20] 29 cmH20  INTAKE / OUTPUT: I/O last 3 completed shifts: In: 1644.3 [I.V.:217.2; Other:190; NG/GT:1087.2; IV Piggyback:150] Out: 3200 [Urine:3200]  PHYSICAL EXAMINATION: General:  Obese, chronically ill appearing female on  Vent via trach Neuro:  Unresponsive, does not follow commands, facial grimace only to painful stimuli. Contracted  HEENT:  Atraumatic, normocephalic, no discharge. 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   LABS:  BMET  Recent Labs Lab 09/22/15 0512 09/23/15 0540 09/24/15 0459  NA 142 147* 145  K 4.7 4.3 3.9  CL 99* 103 106  CO2 29 30 30   BUN 216* 201* 195*  CREATININE 4.63* 4.33* 4.19*  GLUCOSE 87 114* 157*    Electrolytes  Recent Labs Lab 09/22/15 0512 09/23/15 0540 09/24/15 0459   CALCIUM 7.9* 8.1* 8.1*  MG 2.7*  --   --   PHOS 4.0  --   --     CBC  Recent Labs Lab 09/22/15 1324 09/23/15 0540 09/24/15 0459  WBC 11.1* 11.0* 10.0  HGB 7.1* 7.3* 7.2*  HCT 22.9* 23.5* 23.0*  PLT 120* 127* 140*    Coag's  Recent Labs Lab 09/21/15 0133  INR 1.59*    Sepsis Markers  Recent Labs Lab 09/20/15 2319 09/21/15 0133  LATICACIDVEN 1.95 0.99    ABG  Recent Labs Lab 09/21/15 0132  PHART 7.426  PCO2ART 51.0*  PO2ART 58.0*    Liver Enzymes  Recent Labs Lab 09/20/15 2306 09/22/15 0512  AST 28 20  ALT 18 16  ALKPHOS 103 96  BILITOT 0.4 0.7  ALBUMIN 1.2* 1.1*    Cardiac Enzymes No results for input(s): TROPONINI, PROBNP in the last 168 hours.  Glucose  Recent Labs Lab 09/23/15 1949 09/23/15 2358 09/24/15 0410 09/24/15 0802 09/24/15 1305 09/24/15 1658  GLUCAP 144* 132* 157* 136* 117* 108*    Imaging Dg Chest Port 1 View  09/24/2015  CLINICAL DATA:  Respiratory failure EXAM: PORTABLE CHEST 1 VIEW COMPARISON:  09/21/2015 chest radiograph. FINDINGS: Tracheostomy tube tip overlies the tracheal air column at the thoracic inlet. Right PICC is stable in configuration with the inferiorly oriented tip just below the medial right clavicle. Apical right chest tube is stable. Stable cardiomediastinal silhouette with mild cardiomegaly and markedly thickened right paratracheal  mediastinal contour. Stable prominent hila bilaterally. No pneumothorax. Probable trace bilateral pleural effusions. Diffuse linear and hazy bilateral parahilar lung opacities appear slightly worsened. IMPRESSION: 1. Mild cardiomegaly. Slightly worsened linear in hazy bilateral parahilar lung opacities, favor worsening pulmonary edema. 2. Stable markedly thickened right paratracheal mediastinal contour item bilateral hilar prominence, nonspecific, differential includes adenopathy or aneurysm. Recommend correlation with chest CT with IV contrast. 3. Probable trace bilateral  pleural effusions. Electronically Signed   By: Ilona Sorrel M.D.   On: 09/24/2015 07:55    STUDIES:  none  CULTURES: BCx2 3/16 > Urine 3/16 >neg Tracheal aspirate 3/16 >  ANTIBIOTICS: Meropenem 3/16 > Vancomycin 3/16 >  SIGNIFICANT EVENTS: 3/16 transfer from [facility] to [facility] for anemia/hypotension  LINES/TUBES: Trach #7 cuffed >> RUE PICC 2/3 >>> Rt chest tube>>   ASSESSMENT / PLAN:  PULMONARY A: Chronic respiratory failure with ventilator dependence Chronic R effusion with PleurX Wide mediastinum  P:   Full vent support Vent bundle Duoneb PRN Follow up CXR Progress catheter to open draining ? CT chest but little to offer for interventions, therefore not ordered.  CARDIOVASCULAR A:  Hypotension secondary to ABLA Atrial Fibrillation Acute on chronic diastolic CHF P:  Telemetry monitoring Transfuse PRBC for hemoglobin less than 7 Hold home antihypertensive medications (hydralazine, metoprolol)  RENAL Lab Results  Component Value Date   CREATININE 4.19* 09/24/2015   CREATININE 4.33* 09/23/2015   CREATININE 4.63* 09/22/2015    Recent Labs Lab 09/22/15 0512 09/23/15 0540 09/24/15 0459  K 4.7 4.3 3.9     Recent Labs Lab 09/22/15 0512 09/23/15 0540 09/24/15 0459  NA 142 147* 145   A:   Acute on CKD - appears prerenal, improving some over the last 48 hours L hydronephrosis with nephrostomy tube Chronic indwelling foley catheter Hypokalemia Hypocalcemia P:   Follow BMP  Saline lock Not a dialysis candidate Replace lytes  GASTROINTESTINAL A:   GERD Possible GIB Severe protein calorie malnutrition  P:   Continue Protonix TF's running  HEMATOLOGIC  Recent Labs  09/23/15 0540 09/24/15 0459  HGB 7.3* 7.2*    A:   Acute blood loss on anemia of chronic illness. Possibly from GIB, hematuria P:  Serial CBC ? Return to [facility] soon. No real interventions available. Anemia treated  INFECTIOUS A:   Possible sepsis, source  unclear consider PNA, UTI, Sacral wound, or any number of chronic indwelling lines/tubes. P:   Continue mero, vanc Trend WBC and fever curves Follow Blood, urine and sputum culture Wound care consult to evaluate pressure ulcer  ENDOCRINE CBG (last 3)   Recent Labs  09/24/15 0802 09/24/15 1305 09/24/15 1658  GLUCAP 136* 117* 108*   A: No acute issues P:   Trend Glucose  NEUROLOGIC A:   Seizure disorder Anxiety/depression Chronic pain H/o several CVA P:   RASS goal: 0 PRN fentanyl Continue valproic acid,  Continue to hold lexapro, trazodone    Baltazar Apo, MD, PhD 09/24/2015, 6:21 PM [facility] [phone] or if no answer [phone]

## 2015-09-24 NOTE — Progress Notes (Signed)
Pharmacy Antibiotic Note  Jacqueline Fuentes is a 65 y.o. female admitted on 09/20/2015 with sepsis w/ recent Pseudomonas.  Pharmacy has been consulted for Merrem and vancomycin dosing. Pt was started on cefepime 1g q8h and vancomycin 1g q24h on 3/14 at [facility].  3/18 VR is 33 after receiving vancomycin 2g in the ED. 3/19 VR is 32 after holding dose. Will continue to hold vancomycin.  Plan: Hold vancomycin with elevated VR Merrem 500mg  IV q12h Monitor culture data, renal function and clinical course Vanc goal trough 15-20 mcg/mL Recheck VR prn    Temp (24hrs), Avg:100.3 F (37.9 C), Min:99 F (37.2 C), Max:101.4 F (38.6 C)   Recent Labs Lab 09/20/15 2306 09/20/15 2319 09/21/15 0133 09/21/15 0525 09/22/15 0512 09/22/15 1324 09/23/15 0235 09/23/15 0540 09/24/15 0459  WBC 12.6*  --   --  9.6 12.3* 11.1*  --  11.0* 10.0  CREATININE 4.63*  --   --  3.69* 4.63*  --   --  4.33* 4.19*  LATICACIDVEN  --  1.95 0.99  --   --   --   --   --   --   VANCORANDOM  --   --   --   --   --   --  33  --  32    Estimated Creatinine Clearance: 16.7 mL/min (by C-G formula based on Cr of 4.19).    Allergies  Allergen Reactions  . Aspirin   . Heparin   . Lorazepam     Andrey Cota. Diona Foley, PharmD, [place] Clinical Pharmacist Pager [phone] 09/24/2015 8:28 AM

## 2015-09-24 NOTE — Progress Notes (Signed)
[place] Progress Note Patient Name: ASHONTI JAKUB DOB: Jul 05, 1951 MRN: [id number]   Date of Service  09/24/2015  HPI/Events of Note  4BM x 24h but they are no liquid or watery. They are soft pasty per RN  eICU Interventions  flexiseal per RN request Monitor - if worse, then bedside MD to test for  c diff     Intervention Category Intermediate Interventions: Other:  Marq Rebello 09/24/2015, 5:35 AM

## 2015-09-25 DIAGNOSIS — J189 Pneumonia, unspecified organism: Secondary | ICD-10-CM | POA: Diagnosis not present

## 2015-09-25 DIAGNOSIS — I10 Essential (primary) hypertension: Secondary | ICD-10-CM | POA: Diagnosis not present

## 2015-09-25 DIAGNOSIS — J9621 Acute and chronic respiratory failure with hypoxia: Secondary | ICD-10-CM | POA: Diagnosis not present

## 2015-09-25 DIAGNOSIS — A419 Sepsis, unspecified organism: Secondary | ICD-10-CM | POA: Diagnosis not present

## 2015-09-25 DIAGNOSIS — D62 Acute posthemorrhagic anemia: Secondary | ICD-10-CM | POA: Diagnosis not present

## 2015-09-25 DIAGNOSIS — L8990 Pressure ulcer of unspecified site, unspecified stage: Secondary | ICD-10-CM | POA: Diagnosis not present

## 2015-09-25 LAB — BASIC METABOLIC PANEL
Anion gap: 12 (ref 5–15)
BUN: 174 mg/dL — AB (ref 6–20)
CHLORIDE: 108 mmol/L (ref 101–111)
CO2: 31 mmol/L (ref 22–32)
CREATININE: 3.84 mg/dL — AB (ref 0.44–1.00)
Calcium: 8.4 mg/dL — ABNORMAL LOW (ref 8.9–10.3)
GFR calc Af Amer: 13 mL/min — ABNORMAL LOW (ref >60)
GFR calc non Af Amer: 11 mL/min — ABNORMAL LOW (ref >60)
Glucose, Bld: 154 mg/dL — ABNORMAL HIGH (ref 65–99)
Potassium: 3.9 mmol/L (ref 3.5–5.1)
Sodium: 151 mmol/L — ABNORMAL HIGH (ref 135–145)

## 2015-09-25 LAB — GLUCOSE, CAPILLARY
GLUCOSE-CAPILLARY: 130 mg/dL — AB (ref 65–99)
GLUCOSE-CAPILLARY: 136 mg/dL — AB (ref 65–99)
Glucose-Capillary: 134 mg/dL — ABNORMAL HIGH (ref 65–99)
Glucose-Capillary: 119 mg/dL — ABNORMAL HIGH (ref 65–99)
Glucose-Capillary: 137 mg/dL — ABNORMAL HIGH (ref 65–99)
Glucose-Capillary: 145 mg/dL — ABNORMAL HIGH (ref 65–99)

## 2015-09-25 MED ORDER — AMLODIPINE 1 MG/ML ORAL SUSPENSION
5.0000 mg | Freq: Every day | ORAL | Status: DC
  Administered 2015-09-25: 5 mg
  Filled 2015-09-25 (×3): qty 5

## 2015-09-25 MED ORDER — DARBEPOETIN ALFA 60 MCG/0.3ML IJ SOSY
60.0000 ug | PREFILLED_SYRINGE | INTRAMUSCULAR | Status: DC
  Administered 2015-09-28 – 2015-10-05 (×2): 60 ug via SUBCUTANEOUS
  Filled 2015-09-25 (×2): qty 0.3

## 2015-09-25 NOTE — Progress Notes (Signed)
PULMONARY / CRITICAL CARE MEDICINE   Name: ADDYSEN BARMORE MRN: [id number] DOB: Mar 06, 1951    ADMISSION DATE:  09/20/2015 CONSULTATION DATE:  09/20/2015  REFERRING MD:  EDP  CHIEF COMPLAINT:  Anemia, shock  REVIEW OF SYSTEMS:   Unable  HPI: 65 year old unfortunate female. [facility] dependent from [facility]. Presented 3/15 with anemia and hypotension. Unable to transfuse effectively at [facility] due to multiple antibodies in blood. Could be just chronic anemia, but concern for GIB or hematuria.  SUBJECTIVE:   No events overnight.  VITAL SIGNS: BP 174/84 mmHg  Pulse 89  Temp(Src) 101 F (38.3 C) (Oral)  Resp 18  Ht 5\' 8"  (1.727 m)  Wt 99.338 kg (219 lb)  BMI 33.31 kg/m2  SpO2 97%  HEMODYNAMICS:    VENTILATOR SETTINGS: Vent Mode:  [-] PCV FiO2 (%):  [40 %] 40 % Set Rate:  [18 bmp] 18 bmp PEEP:  [5 cmH20] 5 cmH20 Plateau Pressure:  [20 cmH20-29 cmH20] 20 cmH20  INTAKE / OUTPUT: I/O last 3 completed shifts: In: 1326 [I.V.:376; Other:250; NG/GT:600; IV Piggyback:100] Out: 3325 [Urine:3325]  PHYSICAL EXAMINATION: General:  Obese, chronically ill appearing female on  Vent via trach Neuro:  Does not follow commands, facial grimace only to painful stimuli. Contracted  HEENT:  Atraumatic, normocephalic, no discharge. 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   LABS:  BMET  Recent Labs Lab 09/23/15 0540 09/24/15 0459 09/25/15 1226  NA 147* 145 151*  K 4.3 3.9 3.9  CL 103 106 108  CO2 30 30 31   BUN 201* 195* 174*  CREATININE 4.33* 4.19* 3.84*  GLUCOSE 114* 157* 154*   Electrolytes  Recent Labs Lab 09/22/15 0512 09/23/15 0540 09/24/15 0459 09/25/15 1226  CALCIUM 7.9* 8.1* 8.1* 8.4*  MG 2.7*  --   --    --   PHOS 4.0  --   --   --    CBC  Recent Labs Lab 09/22/15 1324 09/23/15 0540 09/24/15 0459  WBC 11.1* 11.0* 10.0  HGB 7.1* 7.3* 7.2*  HCT 22.9* 23.5* 23.0*  PLT 120* 127* 140*   Coag's  Recent Labs Lab 09/21/15 0133  INR 1.59*   Sepsis Markers  Recent Labs Lab 09/20/15 2319 09/21/15 0133  LATICACIDVEN 1.95 0.99   ABG  Recent Labs Lab 09/21/15 0132  PHART 7.426  PCO2ART 51.0*  PO2ART 58.0*   Liver Enzymes  Recent Labs Lab 09/20/15 2306 09/22/15 0512  AST 28 20  ALT 18 16  ALKPHOS 103 96  BILITOT 0.4 0.7  ALBUMIN 1.2* 1.1*   Cardiac Enzymes No results for input(s): TROPONINI, PROBNP in the last 168 hours.  Glucose  Recent Labs Lab 09/24/15 1658 09/24/15 2030 09/24/15 2340 09/25/15 0404 09/25/15 0832 09/25/15 1252  GLUCAP 108* 117* 130* 134* 119* 137*   Imaging I reviewed CXR myself, trach in good position, pleural effusion.  STUDIES:  none  CULTURES: BCx2 3/16 > Urine 3/16 >neg Tracheal aspirate 3/16 >  ANTIBIOTICS: Meropenem 3/16 > Vancomycin 3/16 >  SIGNIFICANT EVENTS: 3/16 transfer from [facility] to [facility] for anemia/hypotension  LINES/TUBES: Trach #7 cuffed >> RUE PICC 2/3 >>> Rt chest tube>>  ASSESSMENT / PLAN:  PULMONARY A: Chronic respiratory failure with ventilator dependence Chronic R effusion with  PleurX Wide mediastinum  P:   Full vent support Vent bundle Duoneb PRN Follow up CXR Progress catheter to open draining ? CT chest but little to offer for interventions, therefore not ordered.  CARDIOVASCULAR A:  Hypotension secondary to ABLA Atrial Fibrillation Acute on chronic diastolic CHF P:  Telemetry monitoring Transfuse PRBC for hemoglobin less than 7 Start norvasc 5 mg PO daily for hypertension with holding parameters.  RENAL Lab Results  Component Value Date   CREATININE 3.84* 09/25/2015   CREATININE 4.19* 09/24/2015   CREATININE 4.33* 09/23/2015   Recent Labs Lab 09/23/15 0540  09/24/15 0459 09/25/15 1226  K 4.3 3.9 3.9   Recent Labs Lab 09/23/15 0540 09/24/15 0459 09/25/15 1226  NA 147* 145 151*   A:   Acute on CKD - appears prerenal, improving some over the last 48 hours L hydronephrosis with nephrostomy tube Chronic indwelling foley catheter Hypokalemia Hypocalcemia P:   Follow BMP  Saline lock Not a dialysis candidate Replace lytes  GASTROINTESTINAL A:   GERD Possible GIB Severe protein calorie malnutrition  P:   Continue Protonix TF's running  HEMATOLOGIC  Recent Labs  09/23/15 0540 09/24/15 0459  HGB 7.3* 7.2*  A:   Acute blood loss on anemia of chronic illness. Possibly from GIB, hematuria P:  Serial CBC ? Return to [facility] soon. No real interventions available. Anemia treated, will communicate with case management.  INFECTIOUS A:   Possible sepsis, source unclear consider PNA, UTI, Sacral wound, or any number of chronic indwelling lines/tubes. P:   Continue mero, vanc Trend WBC and fever curves Follow Blood, urine and sputum culture Wound care consult to evaluate pressure ulcer  ENDOCRINE CBG (last 3)   Recent Labs  09/25/15 0404 09/25/15 0832 09/25/15 1252  GLUCAP 134* 119* 137*   A: No acute issues P:   Trend Glucose  NEUROLOGIC A:   Seizure disorder Anxiety/depression Chronic pain H/o several CVA P:   RASS goal: 0 PRN fentanyl Continue valproic acid,  Continue to hold lexapro, trazodone   Discussed with RT.  Rush Farmer, M.D. [facility] Pulmonary/Critical Care Medicine. Pager: [phone]. After hours pager: [phone].

## 2015-09-25 NOTE — Progress Notes (Signed)
PleurX drainage bottle became dislodged during patient repositioning for dressing change. Tube to patient was clamped and a new PleurX bottle requested from materials. Will plan to reconnect when drainage bottle is delivered.  Milford Cage, RN

## 2015-09-25 NOTE — Progress Notes (Signed)
   09/25/15 1100  Clinical Encounter Type  Visited With Patient;Health care provider  Visit Type Psychological support;Spiritual support;Social support  Referral From Care management   Chaplain stopped by to visit with family but family was NOT at bedside. Please page chaplain if family is in need of more support.

## 2015-09-25 NOTE — Care Management Important Message (Signed)
Important Message  Patient Details  Name: Jacqueline Fuentes MRN: [id number] Date of Birth: 12-29-1950   Medicare Important Message Given:  Yes    Barb Merino Roselind Klus 09/25/2015, 2:34 PM

## 2015-09-26 ENCOUNTER — Inpatient Hospital Stay (HOSPITAL_COMMUNITY): Payer: Medicare Other

## 2015-09-26 DIAGNOSIS — L8990 Pressure ulcer of unspecified site, unspecified stage: Secondary | ICD-10-CM | POA: Diagnosis not present

## 2015-09-26 DIAGNOSIS — J189 Pneumonia, unspecified organism: Secondary | ICD-10-CM | POA: Diagnosis not present

## 2015-09-26 DIAGNOSIS — J9611 Chronic respiratory failure with hypoxia: Secondary | ICD-10-CM

## 2015-09-26 DIAGNOSIS — A419 Sepsis, unspecified organism: Secondary | ICD-10-CM | POA: Diagnosis not present

## 2015-09-26 DIAGNOSIS — D62 Acute posthemorrhagic anemia: Secondary | ICD-10-CM | POA: Diagnosis not present

## 2015-09-26 LAB — CULTURE, BLOOD (ROUTINE X 2)
CULTURE: NO GROWTH
Culture: NO GROWTH

## 2015-09-26 LAB — BASIC METABOLIC PANEL
ANION GAP: 13 (ref 5–15)
BUN: 166 mg/dL — AB (ref 6–20)
CHLORIDE: 109 mmol/L (ref 101–111)
CO2: 31 mmol/L (ref 22–32)
Calcium: 8.4 mg/dL — ABNORMAL LOW (ref 8.9–10.3)
Creatinine, Ser: 3.53 mg/dL — ABNORMAL HIGH (ref 0.44–1.00)
GFR calc Af Amer: 15 mL/min — ABNORMAL LOW (ref >60)
GFR calc non Af Amer: 13 mL/min — ABNORMAL LOW (ref >60)
GLUCOSE: 140 mg/dL — AB (ref 65–99)
POTASSIUM: 3.6 mmol/L (ref 3.5–5.1)
SODIUM: 153 mmol/L — AB (ref 135–145)

## 2015-09-26 LAB — GLUCOSE, CAPILLARY
GLUCOSE-CAPILLARY: 130 mg/dL — AB (ref 65–99)
GLUCOSE-CAPILLARY: 135 mg/dL — AB (ref 65–99)
GLUCOSE-CAPILLARY: 127 mg/dL — AB (ref 65–99)
GLUCOSE-CAPILLARY: 124 mg/dL — AB (ref 65–99)
Glucose-Capillary: 129 mg/dL — ABNORMAL HIGH (ref 65–99)
Glucose-Capillary: 149 mg/dL — ABNORMAL HIGH (ref 65–99)

## 2015-09-26 LAB — CBC
HEMATOCRIT: 21.4 % — AB (ref 36.0–46.0)
HEMOGLOBIN: 6.6 g/dL — AB (ref 12.0–15.0)
MCH: 26.4 pg (ref 26.0–34.0)
MCHC: 30.8 g/dL (ref 30.0–36.0)
MCV: 85.6 fL (ref 78.0–100.0)
Platelets: 146 10*3/uL — ABNORMAL LOW (ref 150–400)
RBC: 2.5 MIL/uL — AB (ref 3.87–5.11)
RDW: 18.3 % — ABNORMAL HIGH (ref 11.5–15.5)
WBC: 10.5 10*3/uL (ref 4.0–10.5)

## 2015-09-26 LAB — VANCOMYCIN, RANDOM: VANCOMYCIN RM: 24 ug/mL

## 2015-09-26 LAB — PHOSPHORUS: PHOSPHORUS: 3.5 mg/dL (ref 2.5–4.6)

## 2015-09-26 LAB — PREPARE RBC (CROSSMATCH)

## 2015-09-26 LAB — MAGNESIUM: Magnesium: 2.2 mg/dL (ref 1.7–2.4)

## 2015-09-26 MED ORDER — SODIUM CHLORIDE 0.9 % IV SOLN: Freq: Once | INTRAVENOUS | Status: DC

## 2015-09-26 MED ORDER — AMLODIPINE BESYLATE 5 MG PO TABS
5.0000 mg | ORAL_TABLET | Freq: Every day | ORAL | Status: DC
  Administered 2015-09-26 – 2015-09-30 (×5): 5 mg
  Filled 2015-09-26 (×5): qty 1

## 2015-09-26 MED ORDER — VANCOMYCIN HCL IN DEXTROSE 1-5 GM/200ML-% IV SOLN
1000.0000 mg | INTRAVENOUS | Status: AC
  Administered 2015-09-27: 1000 mg via INTRAVENOUS
  Filled 2015-09-26: qty 200

## 2015-09-26 MED ORDER — ACETAMINOPHEN 160 MG/5ML PO SOLN: 650.0000 mg | ORAL | Status: DC | PRN

## 2015-09-26 MED ORDER — VITAL HIGH PROTEIN PO LIQD: 1000.0000 mL | ORAL | Status: DC

## 2015-09-26 NOTE — Progress Notes (Signed)
CRITICAL VALUE ALERT  Critical value received: hgb 6.6  Date of notification: 09/26/15  Time of notification 0800  Critical value read back:Yes.    Nurse who received alert:Ames Hoban  MD notified (1st page): Dr.  Nelda Marseille  Time of first page:  1650  MD notified (2nd page):  Time of second page:  Responding MD:  Dr. Nelda Marseille  Time MD responded: [phone]

## 2015-09-26 NOTE — Progress Notes (Addendum)
CSW informed pt nearing being stable for DC- CSW spoke with [facility] where pt is from and was told they do not have beds available currently  CSW attempted to call pt husband to discuss alternate Vent SNF options- husband expresses understanding that we have to look at other options but is very hopeful for pt to return to [facility] or at least to another [facility] facility   [facility], [place], [place]- [phone]- no bed currently- hopeful for opening this week but none planned  [facility] [place], [place] [phone]- referral sent  [address] [place], [place] [phone]- referral sent  [facility], [place], [place] [phone]- referral sent  [facility] [place], [place]- [phone]- referral sent  [place], [place], [place]- [phone]- referral sent  [place], [place], [place]- [phone]- referral sent  Domenica Reamer, [place] Social Worker [phone]

## 2015-09-26 NOTE — Progress Notes (Signed)
PICC line dressinging changed to lt upper arm using sterile technique.

## 2015-09-26 NOTE — Progress Notes (Signed)
PULMONARY / CRITICAL CARE MEDICINE   Name: DEJANAY BURROWS MRN: [id number] DOB: 03-13-1951    ADMISSION DATE:  09/20/2015 CONSULTATION DATE:  09/20/2015  REFERRING MD:  EDP  CHIEF COMPLAINT:  Anemia, shock  REVIEW OF SYSTEMS:   Unable  HPI: 65 year old unfortunate female. [facility] from [facility]. Presented 3/15 with anemia and hypotension. Unable to transfuse effectively at [facility] due to multiple antibodies in blood. Could be just chronic anemia, but concern for GIB or hematuria.  SUBJECTIVE:   No events overnight.  VITAL SIGNS: BP 162/77 mmHg  Pulse 87  Temp(Src) 100.7 F (38.2 C) (Axillary)  Resp 21  Ht 5\' 8"  (1.727 m)  Wt 96.163 kg (212 lb)  BMI 32.24 kg/m2  SpO2 100%  HEMODYNAMICS:    VENTILATOR SETTINGS: Vent Mode:  [-] PCV FiO2 (%):  [40 %] 40 % Set Rate:  [18 bmp] 18 bmp PEEP:  [5 cmH20] 5 cmH20 Plateau Pressure:  [21 [id number] cmH20] 21 cmH20  INTAKE / OUTPUT: I/O last 3 completed shifts: In: 2751 [I.V.:601; Other:250; NG/GT:1750; IV Piggyback:150] Out: 2800 [Urine:2800]  PHYSICAL EXAMINATION: General:  Obese, chronically ill appearing female on  Vent via trach Neuro:  Does not follow commands, facial grimace only to painful stimuli. Contracted  HEENT:  Atraumatic, normocephalic, no discharge. 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   LABS:  BMET  Recent Labs Lab 09/24/15 0459 09/25/15 1226 09/26/15 0600  NA 145 151* 153*  K 3.9 3.9 3.6  CL 106 108 109  CO2 30 31 31   BUN 195* 174* 166*  CREATININE 4.19* 3.84* 3.53*  GLUCOSE 157* 154* 140*   Electrolytes  Recent Labs Lab 09/22/15 0512  09/24/15 0459 09/25/15 1226 09/26/15 0600  CALCIUM 7.9*  < > 8.1* 8.4* 8.4*  MG  2.7*  --   --   --  2.2  PHOS 4.0  --   --   --  3.5  < > = values in this interval not displayed. CBC  Recent Labs Lab 09/23/15 0540 09/24/15 0459 09/26/15 0600  WBC 11.0* 10.0 10.5  HGB 7.3* 7.2* 6.6*  HCT 23.5* 23.0* 21.4*  PLT 127* 140* 146*   Coag's  Recent Labs Lab 09/21/15 0133  INR 1.59*   Sepsis Markers  Recent Labs Lab 09/20/15 2319 09/21/15 0133  LATICACIDVEN 1.95 0.99   ABG  Recent Labs Lab 09/21/15 0132  PHART 7.426  PCO2ART 51.0*  PO2ART 58.0*   Liver Enzymes  Recent Labs Lab 09/20/15 2306 09/22/15 0512  AST 28 20  ALT 18 16  ALKPHOS 103 96  BILITOT 0.4 0.7  ALBUMIN 1.2* 1.1*   Cardiac Enzymes No results for input(s): TROPONINI, PROBNP in the last 168 hours.  Glucose  Recent Labs Lab 09/25/15 1600 09/25/15 2048 09/26/15 0110 09/26/15 0354 09/26/15 0840 09/26/15 1242  GLUCAP 136* 145* 130* 135* 127* 124*   Imaging I reviewed CXR myself, trach in good position, pleural effusion.  STUDIES:  none  CULTURES: BCx2 3/16 > Urine 3/16 >neg Tracheal aspirate 3/16 >  ANTIBIOTICS: Meropenem 3/16 > Vancomycin 3/16 >  SIGNIFICANT EVENTS: 3/16 transfer from [facility] to [facility] for anemia/hypotension  LINES/TUBES: Trach #7 cuffed >> RUE PICC 2/3 >>> Rt chest tube>>  ASSESSMENT /  PLAN:  PULMONARY A: Chronic respiratory failure with ventilator dependence Chronic R effusion with PleurX Wide mediastinum  P:   Full vent support Vent bundle Duoneb PRN Follow up CXR Progress catheter to open draining ? CT chest but little to offer for interventions, therefore not ordered.  CARDIOVASCULAR A:  Hypotension secondary to ABLA Atrial Fibrillation Acute on chronic diastolic CHF P:  Telemetry monitoring Transfuse PRBC for hemoglobin less than 7 Start norvasc 5 mg PO daily for hypertension with holding parameters.  RENAL Lab Results  Component Value Date   CREATININE 3.53* 09/26/2015   CREATININE 3.84* 09/25/2015    CREATININE 4.19* 09/24/2015    Recent Labs Lab 09/24/15 0459 09/25/15 1226 09/26/15 0600  K 3.9 3.9 3.6    Recent Labs Lab 09/24/15 0459 09/25/15 1226 09/26/15 0600  NA 145 151* 153*   A:   Acute on CKD - appears prerenal, improving some over the last 48 hours L hydronephrosis with nephrostomy tube Chronic indwelling foley catheter Hypokalemia Hypocalcemia P:   Follow BMP  Saline lock Not a dialysis candidate Replace lytes  GASTROINTESTINAL A:   GERD Possible GIB Severe protein calorie malnutrition  P:   Continue Protonix TF's running  HEMATOLOGIC  Recent Labs  09/24/15 0459 09/26/15 0600  HGB 7.2* 6.6*  A:   Acute blood loss on anemia of chronic illness. Possibly from GIB, hematuria P:  Serial CBC ? Return to [facility] soon. No real interventions available. Anemia treated, will communicate with case management.  INFECTIOUS A:   Possible sepsis, source unclear consider PNA, UTI, Sacral wound, or any number of chronic indwelling lines/tubes. P:   Continue mero, vanc for total of 8 days (2 more days). Trend WBC and fever curves Follow Blood, urine and sputum culture Wound care consult to evaluate pressure ulcer  ENDOCRINE CBG (last 3)   Recent Labs  09/26/15 0354 09/26/15 0840 09/26/15 1242  GLUCAP 135* 127* 124*   A: No acute issues P:   Trend Glucose  NEUROLOGIC A:   Seizure disorder Anxiety/depression Chronic pain H/o several CVA P:   RASS goal: 0 PRN fentanyl Continue valproic acid,  Continue to hold lexapro, trazodone   Discussed with RT.  Rush Farmer, M.D. [facility] Pulmonary/Critical Care Medicine. Pager: [phone]. After hours pager: [phone].

## 2015-09-26 NOTE — Plan of Care (Signed)
Problem: Tissue Perfusion: Goal: Risk factors for ineffective tissue perfusion will decrease Outcome: Not Progressing Variance: Slow or unresponsive to therapy Comments: Patient's hemaglobin critcally low  Problem: Activity: Goal: Risk for activity intolerance will decrease Outcome: Not Progressing Variance: Slow or unresponsive to therapy Comments: Hemoglobin critally low 6.6

## 2015-09-26 NOTE — Progress Notes (Signed)
Pharmacy Antibiotic Note  Jacqueline Fuentes is a 65 y.o. female admitted on 09/20/2015 with sepsis.  Patient has a history of recent Pseudomonal infection.  She is on day #8 of vancomycin and day #6 of meropenem.  Her renal function is improving slowly and patient's vancomycin level is being cleared.  Based on patient's kinetic, expect vancomycin level to be WNL in AM.  Pharmacy also managing Aranesp for anemia.  Hemoglobin has decreased further to 6.6 g/dL today - difficult to transfuse given existing autoantibodies.  Her iron level is low and ferritin is elevated.  Will need iron supplementation once infection clears.   Plan: - Continue Merrem 500mg  IV Q12H - Vanc 1gm IV Q48H, start 09/27/15 - Monitor renal fxn, vanc trough at [facility] - Aranesp 11mcg SQ q-Thurs x4 doses total.  Pharmacy will sign off of and monitor peripherally. - F/U resume home meds: Lopressor, hydralazine, Lexapro, Duonebs, trazodone - F/U start free water +/- D5W - F/U abx LOT    Height: 5\' 8"  (172.7 cm) Weight: 212 lb (96.163 kg) IBW/kg (Calculated) : 63.9  Temp (24hrs), Avg:100.9 F (38.3 C), Min:99.7 F (37.6 C), Max:102.8 F (39.3 C)   Recent Labs Lab 09/20/15 2319 09/21/15 0133  09/22/15 0512 09/22/15 1324  09/23/15 0540 09/24/15 0459 09/25/15 1226 09/26/15 0600  WBC  --   --   < > 12.3* 11.1*  --  11.0* 10.0  --  10.5  CREATININE  --   --   < > 4.63*  --   --  4.33* 4.19* 3.84* 3.53*  LATICACIDVEN 1.95 0.99  --   --   --   --   --   --   --   --   VANCORANDOM  --   --   --   --   --   < >  --  32  --  24  < > = values in this interval not displayed.  Estimated Creatinine Clearance: 19.3 mL/min (by C-G formula based on Cr of 3.53).    Allergies  Allergen Reactions  . Aspirin   . Heparin   . Lorazepam     Antimicrobials this admission: Cefepime PTA 3/14 >> 3/16 Vanc 3/14 PTA >> Merrem 3/16 >>  Dose adjustments this admission: Vanc 2gm at [facility] on 3/15 PTA, 2gm in ED on 3/16 3/18 VR 0230 =  33 mcg/mL 3/19 VR 0500 = 32 mcg/mL 3/21 VR 0600 = 24 mcg/mL  Microbiology results: 3/15 BCx x2 - NGTD 3/16 UCx - negative    Denisha Hoel D. Mina Marble, PharmD, BCPS Pager:  [phone] 09/26/2015, 9:19 AM

## 2015-09-26 NOTE — Progress Notes (Addendum)
Nutrition Follow Up  DOCUMENTATION CODES:   Obesity unspecified  INTERVENTION:    Continue Vital AF 1.2 formula at goal rate of 50 ml/hr to provide 1440 kcal, 90 grams of protein, 973 ml of free water.   NUTRITION DIAGNOSIS:   Inadequate oral intake related to inability to eat as evidenced by NPO status, ongoing  GOAL:   Patient will meet greater than or equal to 90% of their needs, met  MONITOR:   Vent status, Labs, Weight trends, TF tolerance, Skin, I & O's  ASSESSMENT:   Pt from [facility] who is trach/vent/PEG dependent at baseline. She has complicated past medical history as below, which includes seizure disorder, multiple CVAs, atrial fibrillation, chronic kidney disease, sacral decubitus, R sided pleural effusion with Pleur-X in place, diastolic CHF, and L hydronephrosis with chronic nephrostomy tube.   Patient is currently on ventilator support via trach MV: 10.4 L/min Temp (24hrs), Avg:100.8 F (38.2 C), Min:99.7 F (37.6 C), Max:102.8 F (39.3 C)   CWOCN note 3/16 reviewed.  Palliative Care Team following.  Vital AF 1.2 at 50 ml/hr infusing via PEG tube providing 1440 kcal, 90 grams of protein, 973 ml of free water.  Tolerating well.  Diet Order:  Diet NPO time specified  Skin:   Unstageable pressure injury to right inner heel  Right outer foot with dark purple-red deep tissue injury Inner ankle with partial thickness wound Right posterior leg with full thickness wound Right posterior calf with unstageable pressure injury Sacrum with stage 4 pressure injury  Last BM:  09/21/2015  Height:   Ht Readings from Last 1 Encounters:  09/21/15 '5\' 8"'  (1.727 m)    Weight:   Wt Readings from Last 1 Encounters:  09/26/15 212 lb (96.163 kg)    Ideal Body Weight:  55.6 kg (-12.5% for quadriplegia)  BMI:  Body mass index is 32.24 kg/(m^2).  Estimated Nutritional Needs:   Kcal:  1400-1600  Protein:  85-100 grams  Fluid:  1.1-1.5 L  EDUCATION NEEDS:    No education needs identified at this time  Arthur Holms, RD, LDN Pager #: [phone] After-Hours Pager #: [phone]

## 2015-09-27 DIAGNOSIS — Z930 Tracheostomy status: Secondary | ICD-10-CM

## 2015-09-27 DIAGNOSIS — J189 Pneumonia, unspecified organism: Secondary | ICD-10-CM | POA: Diagnosis not present

## 2015-09-27 DIAGNOSIS — A419 Sepsis, unspecified organism: Secondary | ICD-10-CM | POA: Diagnosis not present

## 2015-09-27 DIAGNOSIS — E43 Unspecified severe protein-calorie malnutrition: Secondary | ICD-10-CM | POA: Diagnosis not present

## 2015-09-27 DIAGNOSIS — J9621 Acute and chronic respiratory failure with hypoxia: Secondary | ICD-10-CM | POA: Diagnosis not present

## 2015-09-27 DIAGNOSIS — R579 Shock, unspecified: Secondary | ICD-10-CM | POA: Diagnosis not present

## 2015-09-27 DIAGNOSIS — I7101 Dissection of thoracic aorta: Secondary | ICD-10-CM | POA: Diagnosis not present

## 2015-09-27 DIAGNOSIS — L8990 Pressure ulcer of unspecified site, unspecified stage: Secondary | ICD-10-CM | POA: Diagnosis not present

## 2015-09-27 DIAGNOSIS — D62 Acute posthemorrhagic anemia: Secondary | ICD-10-CM | POA: Diagnosis not present

## 2015-09-27 LAB — GLUCOSE, CAPILLARY
GLUCOSE-CAPILLARY: 138 mg/dL — AB (ref 65–99)
Glucose-Capillary: 116 mg/dL — ABNORMAL HIGH (ref 65–99)
Glucose-Capillary: 122 mg/dL — ABNORMAL HIGH (ref 65–99)
Glucose-Capillary: 123 mg/dL — ABNORMAL HIGH (ref 65–99)
Glucose-Capillary: 128 mg/dL — ABNORMAL HIGH (ref 65–99)
Glucose-Capillary: 135 mg/dL — ABNORMAL HIGH (ref 65–99)

## 2015-09-27 LAB — HEMOGLOBIN AND HEMATOCRIT, BLOOD
HCT: 23.4 % — ABNORMAL LOW (ref 36.0–46.0)
Hemoglobin: 7.4 g/dL — ABNORMAL LOW (ref 12.0–15.0)

## 2015-09-27 MED ORDER — AMIODARONE HCL IN DEXTROSE 360-4.14 MG/200ML-% IV SOLN
30.0000 mg/h | INTRAVENOUS | Status: DC
  Administered 2015-09-28: 30 mg/h via INTRAVENOUS
  Filled 2015-09-27: qty 200

## 2015-09-27 MED ORDER — AMIODARONE HCL IN DEXTROSE 360-4.14 MG/200ML-% IV SOLN
60.0000 mg/h | INTRAVENOUS | Status: AC
  Administered 2015-09-27: 60 mg/h via INTRAVENOUS
  Filled 2015-09-27: qty 200

## 2015-09-27 NOTE — Progress Notes (Addendum)
PULMONARY / CRITICAL CARE MEDICINE   Name: Jacqueline Fuentes MRN: [id number] DOB: 1951/02/02    ADMISSION DATE:  09/20/2015 CONSULTATION DATE:  09/20/2015  REFERRING MD:  EDP  CHIEF COMPLAINT:  Anemia, shock  REVIEW OF SYSTEMS:   Unable  HPI: 65 year old unfortunate female. Vent[facility] dependent from [facility]. Presented 3/15 with anemia and hypotension. Unable to transfuse effectively at [facility] due to multiple antibodies in blood. Could be just chronic anemia, but concern for GIB or hematuria.  SUBJECTIVE:   No events overnight.  VITAL SIGNS: BP 149/85 mmHg  Pulse 87  Temp(Src) 99.1 F (37.3 C) (Oral)  Resp 25  Ht 5\' 8"  (1.727 m)  Wt 93.441 kg (206 lb)  BMI 31.33 kg/m2  SpO2 100%  HEMODYNAMICS:    VENTILATOR SETTINGS: Vent Mode:  [-] PCV FiO2 (%):  [40 %] 40 % Set Rate:  [18 bmp] 18 bmp PEEP:  [5 cmH20] 5 cmH20 Plateau Pressure:  [22 [id number] cmH20] 27 cmH20  INTAKE / OUTPUT: I/O last 3 completed shifts: In: [id number] [I.V.:105; Blood:335; NG/GT:550; IV Piggyback:50] Out: 3025 [Urine:3025]  PHYSICAL EXAMINATION: General:  Obese, chronically ill appearing female on  Vent via trach Neuro:  Does not follow commands, facial grimace only to painful stimuli. Contracted  HEENT:  Atraumatic, normocephalic, no discharge. 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   LABS:  BMET  Recent Labs Lab 09/24/15 0459 09/25/15 1226 09/26/15 0600  NA 145 151* 153*  K 3.9 3.9 3.6  CL 106 108 109  CO2 30 31 31   BUN 195* 174* 166*  CREATININE 4.19* 3.84* 3.53*  GLUCOSE 157* 154* 140*   Electrolytes  Recent Labs Lab 09/22/15 0512  09/24/15 0459 09/25/15 1226 09/26/15 0600  CALCIUM 7.9*  < > 8.1* 8.4* 8.4*  MG 2.7*  --    --   --  2.2  PHOS 4.0  --   --   --  3.5  < > = values in this interval not displayed. CBC  Recent Labs Lab 09/23/15 0540 09/24/15 0459 09/26/15 0600 09/27/15 0511  WBC 11.0* 10.0 10.5  --   HGB 7.3* 7.2* 6.6* 7.4*  HCT 23.5* 23.0* 21.4* 23.4*  PLT 127* 140* 146*  --    Coag's  Recent Labs Lab 09/21/15 0133  INR 1.59*   Sepsis Markers  Recent Labs Lab 09/20/15 2319 09/21/15 0133  LATICACIDVEN 1.95 0.99   ABG  Recent Labs Lab 09/21/15 0132  PHART 7.426  PCO2ART 51.0*  PO2ART 58.0*   Liver Enzymes  Recent Labs Lab 09/20/15 2306 09/22/15 0512  AST 28 20  ALT 18 16  ALKPHOS 103 96  BILITOT 0.4 0.7  ALBUMIN 1.2* 1.1*   Cardiac Enzymes No results for input(s): TROPONINI, PROBNP in the last 168 hours.  Glucose  Recent Labs Lab 09/26/15 1731 09/26/15 2042 09/26/15 2357 09/27/15 0503 09/27/15 0824 09/27/15 1211  GLUCAP 129* 149* 138* 116* 122* 123*   Imaging I reviewed CXR myself, trach in good position, pleural effusion.  STUDIES:  none  CULTURES: BCx2 3/16 > Urine 3/16 >neg Tracheal aspirate 3/16 >  ANTIBIOTICS: Meropenem 3/16 >3/22 Vancomycin 3/16 >3/22  SIGNIFICANT EVENTS: 3/16 transfer from [facility] to [facility] for anemia/hypotension  LINES/TUBES: Trach #7 cuffed >>  RUE PICC 2/3 >>> Rt chest tube>>  ASSESSMENT / PLAN:  PULMONARY A: Chronic respiratory failure with ventilator dependence Chronic R effusion with PleurX Wide mediastinum  P:   Full vent support Vent bundle Duoneb PRN Follow up CXR Progress catheter to open draining ? CT chest but little to offer for interventions, therefore not ordered.  CARDIOVASCULAR A:  Hypotension secondary to ABLA Atrial Fibrillation Acute on chronic diastolic CHF P:  Telemetry monitoring Transfuse PRBC for hemoglobin less than 7 Start norvasc 5 mg PO daily for hypertension with holding parameters.  RENAL Lab Results  Component Value Date   CREATININE 3.53* 09/26/2015    CREATININE 3.84* 09/25/2015   CREATININE 4.19* 09/24/2015    Recent Labs Lab 09/24/15 0459 09/25/15 1226 09/26/15 0600  K 3.9 3.9 3.6    Recent Labs Lab 09/24/15 0459 09/25/15 1226 09/26/15 0600  NA 145 151* 153*   A:   Acute on CKD - appears prerenal, improving some over the last 48 hours L hydronephrosis with nephrostomy tube Chronic indwelling foley catheter Hypokalemia Hypocalcemia P:   Follow BMP  Saline lock Not a dialysis candidate Replace lytes  GASTROINTESTINAL A:   GERD Possible GIB Severe protein calorie malnutrition  P:   Continue Protonix TF's running  HEMATOLOGIC  Recent Labs  09/26/15 0600 09/27/15 0511  HGB 6.6* 7.4*  A:   Acute blood loss on anemia of chronic illness. Possibly from GIB, hematuria P:  Serial CBC ? Return to [facility] soon. No real interventions available. Anemia treated, will communicate with case management.  INFECTIOUS A:   Possible sepsis, source unclear consider PNA, UTI, Sacral wound, or any number of chronic indwelling lines/tubes. P:   D/C abx after today's dose. Trend WBC and fever curves Follow Blood, urine and sputum culture Wound care consult to evaluate pressure ulcer  ENDOCRINE CBG (last 3)   Recent Labs  09/27/15 0503 09/27/15 0824 09/27/15 1211  GLUCAP 116* 122* 123*   A: No acute issues P:   Trend Glucose  NEUROLOGIC A:   Seizure disorder Anxiety/depression Chronic pain H/o several CVA P:   RASS goal: 0 PRN fentanyl Continue valproic acid,  Continue to hold lexapro, trazodone   Discussed with RT.  Rush Farmer, M.D. [facility] Pulmonary/Critical Care Medicine. Pager: [phone]. After hours pager: [phone].

## 2015-09-27 NOTE — Progress Notes (Signed)
No bed available at vent facilities. CSW will continue to follow for availability.  Percell Locus Kameo Bains LCSWA [phone]

## 2015-09-27 NOTE — Progress Notes (Signed)
eLink Physician-Brief Progress Note Patient Name: Jacqueline Fuentes DOB: [birth year] MRN: [id number]   Date of Service  09/27/2015  HPI/Events of Note  Patient with new onset Afib HR in 140's Camera Check-s/p trach on vent, BP stable, no desats, resting comfortably   eICU Interventions  1.check cardiac enzymes 2.check lytes 3.start amiodarone infusion     Intervention Category Major Interventions: Arrhythmia - evaluation and management  Brexley Cutshaw 09/27/2015, 11:13 PM

## 2015-09-28 ENCOUNTER — Inpatient Hospital Stay (HOSPITAL_COMMUNITY): Payer: Medicare Other

## 2015-09-28 DIAGNOSIS — Z930 Tracheostomy status: Secondary | ICD-10-CM | POA: Diagnosis not present

## 2015-09-28 DIAGNOSIS — D62 Acute posthemorrhagic anemia: Secondary | ICD-10-CM | POA: Diagnosis not present

## 2015-09-28 DIAGNOSIS — Z9911 Dependence on respirator [ventilator] status: Secondary | ICD-10-CM

## 2015-09-28 DIAGNOSIS — J189 Pneumonia, unspecified organism: Secondary | ICD-10-CM | POA: Diagnosis not present

## 2015-09-28 DIAGNOSIS — A419 Sepsis, unspecified organism: Secondary | ICD-10-CM | POA: Diagnosis not present

## 2015-09-28 DIAGNOSIS — L8990 Pressure ulcer of unspecified site, unspecified stage: Secondary | ICD-10-CM | POA: Diagnosis not present

## 2015-09-28 LAB — MAGNESIUM
Magnesium: 2 mg/dL (ref 1.7–2.4)
Magnesium: 1.9 mg/dL (ref 1.7–2.4)

## 2015-09-28 LAB — BASIC METABOLIC PANEL
ANION GAP: 15 (ref 5–15)
Anion gap: 11 (ref 5–15)
BUN: 127 mg/dL — ABNORMAL HIGH (ref 6–20)
BUN: 131 mg/dL — AB (ref 6–20)
CALCIUM: 8.4 mg/dL — AB (ref 8.9–10.3)
CO2: 31 mmol/L (ref 22–32)
CO2: 30 mmol/L (ref 22–32)
Calcium: 8.3 mg/dL — ABNORMAL LOW (ref 8.9–10.3)
Chloride: 113 mmol/L — ABNORMAL HIGH (ref 101–111)
Chloride: 111 mmol/L (ref 101–111)
Creatinine, Ser: 2.9 mg/dL — ABNORMAL HIGH (ref 0.44–1.00)
Creatinine, Ser: 2.71 mg/dL — ABNORMAL HIGH (ref 0.44–1.00)
GFR calc Af Amer: 18 mL/min — ABNORMAL LOW (ref >60)
GFR, EST AFRICAN AMERICAN: 20 mL/min — AB (ref >60)
GFR, EST NON AFRICAN AMERICAN: 17 mL/min — AB (ref >60)
GFR, EST NON AFRICAN AMERICAN: 16 mL/min — AB (ref >60)
GLUCOSE: 181 mg/dL — AB (ref 65–99)
GLUCOSE: 144 mg/dL — AB (ref 65–99)
POTASSIUM: 3.5 mmol/L (ref 3.5–5.1)
Potassium: 3.5 mmol/L (ref 3.5–5.1)
Sodium: 155 mmol/L — ABNORMAL HIGH (ref 135–145)
Sodium: 156 mmol/L — ABNORMAL HIGH (ref 135–145)

## 2015-09-28 LAB — PHOSPHORUS: PHOSPHORUS: 3.8 mg/dL (ref 2.5–4.6)

## 2015-09-28 LAB — TROPONIN I
TROPONIN I: 0.03 ng/mL (ref <0.031)
TROPONIN I: 0.05 ng/mL — AB (ref <0.031)
TROPONIN I: 0.06 ng/mL — AB (ref <0.031)

## 2015-09-28 LAB — CK TOTAL AND CKMB (NOT AT ARMC)
CK TOTAL: 15 U/L — AB (ref 38–234)
CK, MB: 1.5 ng/mL (ref 0.5–5.0)
RELATIVE INDEX: INVALID (ref 0.0–2.5)

## 2015-09-28 LAB — GLUCOSE, CAPILLARY
GLUCOSE-CAPILLARY: 122 mg/dL — AB (ref 65–99)
Glucose-Capillary: 109 mg/dL — ABNORMAL HIGH (ref 65–99)
Glucose-Capillary: 111 mg/dL — ABNORMAL HIGH (ref 65–99)
Glucose-Capillary: 115 mg/dL — ABNORMAL HIGH (ref 65–99)
Glucose-Capillary: 120 mg/dL — ABNORMAL HIGH (ref 65–99)
Glucose-Capillary: 126 mg/dL — ABNORMAL HIGH (ref 65–99)

## 2015-09-28 LAB — CBC
HEMATOCRIT: 24.3 % — AB (ref 36.0–46.0)
Hemoglobin: 7.3 g/dL — ABNORMAL LOW (ref 12.0–15.0)
MCH: 26.4 pg (ref 26.0–34.0)
MCHC: 30 g/dL (ref 30.0–36.0)
MCV: 88 fL (ref 78.0–100.0)
PLATELETS: 141 10*3/uL — AB (ref 150–400)
RBC: 2.76 MIL/uL — AB (ref 3.87–5.11)
RDW: 18.2 % — ABNORMAL HIGH (ref 11.5–15.5)
WBC: 9.2 10*3/uL (ref 4.0–10.5)

## 2015-09-28 MED ORDER — FREE WATER
200.0000 mL | Freq: Three times a day (TID) | Status: DC
  Administered 2015-09-28 (×2): 200 mL

## 2015-09-28 MED ORDER — AMIODARONE HCL 200 MG PO TABS
200.0000 mg | ORAL_TABLET | Freq: Every day | ORAL | Status: DC
  Administered 2015-09-28 – 2015-10-02 (×5): 200 mg via ORAL
  Filled 2015-09-28 (×5): qty 1

## 2015-09-28 MED ORDER — FREE WATER
250.0000 mL | Freq: Four times a day (QID) | Status: DC
  Administered 2015-09-28 – 2015-09-29 (×4): 250 mL

## 2015-09-28 NOTE — Progress Notes (Signed)
[place] Progress Note Patient Name: Jacqueline Fuentes DOB: [birth year] MRN: [id number]   Date of Service  09/28/2015  HPI/Events of Note  NA=155  eICU Interventions  Will start free h20 flushes        Flora Lipps 09/28/2015, 2:40 AM

## 2015-09-28 NOTE — Progress Notes (Signed)
PULMONARY / CRITICAL CARE MEDICINE   Name: Jacqueline Fuentes MRN: [id number] DOB: [birth year]    ADMISSION DATE:  09/20/2015 CONSULTATION DATE:  09/20/2015  REFERRING MD:  EDP  CHIEF COMPLAINT:  Anemia, shock  REVIEW OF SYSTEMS:   Unable  HPI: 65 year old unfortunate female. [facility]. Presented 3/15 with anemia and hypotension. Unable to transfuse effectively at [facility] due to multiple antibodies in blood. Could be just chronic anemia, but concern for GIB or hematuria.  SUBJECTIVE:   No events overnight.  VITAL SIGNS: BP 121/68 mmHg  Pulse 67  Temp(Src) 98.2 F (36.8 C) (Oral)  Resp 18  Ht 5\' 8"  (1.727 m)  Wt 87.544 kg (193 lb)  BMI 29.35 kg/m2  SpO2 100%  HEMODYNAMICS:    VENTILATOR SETTINGS: Vent Mode:  [-] PCV FiO2 (%):  [40 %] 40 % Set Rate:  [18 bmp] 18 bmp PEEP:  [5 cmH20] 5 cmH20 Plateau Pressure:  [20 cmH20-29 cmH20] 20 cmH20  INTAKE / OUTPUT: I/O last 3 completed shifts: In: 2089 [I.V.:316.5; Blood:335; NG/GT:1387.5; IV Piggyback:50] Out: [id number] [Urine:4200; Chest Tube:10]  PHYSICAL EXAMINATION: General:  Obese, chronically ill appearing female on  Vent via trach Neuro:  Does not follow commands, facial grimace only to painful stimuli. Contracted  HEENT:  Atraumatic, normocephalic, no discharge. 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   LABS:  BMET  Recent Labs Lab 09/26/15 0600 09/28/15 0001 09/28/15 0540  NA 153* 155* 156*  K 3.6 3.5 3.5  CL 109 113* 111  CO2 31 31 30   BUN 166* 131* 127*  CREATININE 3.53* 2.90* 2.71*  GLUCOSE 140* 144* 181*   Electrolytes  Recent Labs Lab 09/22/15 0512  09/26/15 0600 09/28/15 0001 09/28/15 0540  CALCIUM 7.9*  < > 8.4*  8.4* 8.3*  MG 2.7*  --  2.2 2.0 1.9  PHOS 4.0  --  3.5  --  3.8  < > = values in this interval not displayed. CBC  Recent Labs Lab 09/24/15 0459 09/26/15 0600 09/27/15 0511 09/28/15 0540  WBC 10.0 10.5  --  9.2  HGB 7.2* 6.6* 7.4* 7.3*  HCT 23.0* 21.4* 23.4* 24.3*  PLT 140* 146*  --  141*   Coag's No results for input(s): APTT, INR in the last 168 hours. Sepsis Markers No results for input(s): LATICACIDVEN, PROCALCITON, O2SATVEN in the last 168 hours. ABG No results for input(s): PHART, PCO2ART, PO2ART in the last 168 hours. Liver Enzymes  Recent Labs Lab 09/22/15 0512  AST 20  ALT 16  ALKPHOS 96  BILITOT 0.7  ALBUMIN 1.1*   Cardiac Enzymes  Recent Labs Lab 09/28/15 0001 09/28/15 0540  TROPONINI 0.05* 0.06*    Glucose  Recent Labs Lab 09/27/15 1211 09/27/15 1636 09/27/15 2051 09/28/15 0044 09/28/15 0446 09/28/15 0803  GLUCAP 123* 128* 135* 120* 126* 109*   Imaging I reviewed CXR myself, trach in good position, pleural effusion.  STUDIES:  none  CULTURES: BCx2 3/16 > Urine 3/16 >neg Tracheal aspirate 3/16 >  ANTIBIOTICS: Meropenem 3/16 >3/22 Vancomycin 3/16 >3/22  SIGNIFICANT EVENTS: 3/16 transfer from [facility] to [facility] for anemia/hypotension  LINES/TUBES: Trach #7 cuffed >> RUE PICC 2/3 >>> Rt chest tube>>  ASSESSMENT / PLAN:  PULMONARY A: Chronic  respiratory failure with ventilator dependence Chronic R effusion with PleurX Wide mediastinum  P:   Full vent support Vent bundle Duoneb PRN Follow up CXR Progress catheter to open draining ? CT chest but little to offer for interventions, therefore not ordered.  CARDIOVASCULAR A:  Hypotension secondary to ABLA Atrial Fibrillation Acute on chronic diastolic CHF P:  Telemetry monitoring Transfuse PRBC for hemoglobin less than 7 Start norvasc 5 mg PO daily for hypertension with holding parameters.  RENAL Lab Results  Component Value Date   CREATININE 2.71* 09/28/2015    CREATININE 2.90* 09/28/2015   CREATININE 3.53* 09/26/2015    Recent Labs Lab 09/26/15 0600 09/28/15 0001 09/28/15 0540  K 3.6 3.5 3.5    Recent Labs Lab 09/26/15 0600 09/28/15 0001 09/28/15 0540  NA 153* 155* 156*   A:   Acute on CKD - appears prerenal, improving some over the last 48 hours L hydronephrosis with nephrostomy tube Chronic indwelling foley catheter Hypokalemia Hypocalcemia P:   Follow BMP  Saline lock Add free water 250 ml q6 hours. Not a dialysis candidate Replace lytes  GASTROINTESTINAL A:   GERD Possible GIB Severe protein calorie malnutrition  P:   Continue Protonix TF's running  HEMATOLOGIC  Recent Labs  09/27/15 0511 09/28/15 0540  HGB 7.4* 7.3*  A:   Acute blood loss on anemia of chronic illness. Possibly from GIB, hematuria P:  Serial CBC ? Return to [facility] soon. No real interventions available. Anemia treated, will communicate with case management.  INFECTIOUS A:   Possible sepsis, source unclear consider PNA, UTI, Sacral wound, or any number of chronic indwelling lines/tubes. P:   D/C abx after today's dose. Trend WBC and fever curves Follow Blood, urine and sputum culture Wound care consult to evaluate pressure ulcer  ENDOCRINE CBG (last 3)   Recent Labs  09/28/15 0044 09/28/15 0446 09/28/15 0803  GLUCAP 120* 126* 109*   A: No acute issues P:   Trend Glucose  NEUROLOGIC A:   Seizure disorder Anxiety/depression Chronic pain H/o several CVA P:   RASS goal: 0 PRN fentanyl Continue valproic acid,  Continue to hold lexapro, trazodone   Discussed with RT and case management, [facility] will have a bed for her on Tuesday.  Rush Farmer, M.D. [facility] Pulmonary/Critical Care Medicine. Pager: [phone]. After hours pager: [phone].

## 2015-09-28 NOTE — Progress Notes (Signed)
Patient converted back to NSR. Dr. Flora Lipps updated. Amioderone continued.

## 2015-09-28 NOTE — Care Management Important Message (Signed)
Important Message  Patient Details  Name: Jacqueline Fuentes MRN: [id number] Date of Birth: Jul 18, 1950   Medicare Important Message Given:  Yes    [name] 09/28/2015, 12:44 PM

## 2015-09-28 NOTE — Progress Notes (Signed)
Pt currently on IV amioderone- will have to be discontinued before transfer to [facility]  CSW spoke with [facility]- they anticipate having bed available for pt Tuesday of next week if planned DC goes through.  CSW will continue to look at alternate facilities if [facility] unable to admit- [facility] looking at pt and possibly able to offer bed next week.  CSW will continue to follow  Domenica Reamer, [place] Social Worker [phone]

## 2015-09-29 DIAGNOSIS — D62 Acute posthemorrhagic anemia: Secondary | ICD-10-CM | POA: Diagnosis not present

## 2015-09-29 DIAGNOSIS — J189 Pneumonia, unspecified organism: Secondary | ICD-10-CM | POA: Diagnosis not present

## 2015-09-29 DIAGNOSIS — A419 Sepsis, unspecified organism: Secondary | ICD-10-CM | POA: Diagnosis not present

## 2015-09-29 DIAGNOSIS — L8990 Pressure ulcer of unspecified site, unspecified stage: Secondary | ICD-10-CM | POA: Diagnosis not present

## 2015-09-29 DIAGNOSIS — Z9911 Dependence on respirator [ventilator] status: Secondary | ICD-10-CM | POA: Diagnosis not present

## 2015-09-29 DIAGNOSIS — J9611 Chronic respiratory failure with hypoxia: Secondary | ICD-10-CM | POA: Diagnosis not present

## 2015-09-29 LAB — BASIC METABOLIC PANEL
ANION GAP: 8 (ref 5–15)
BUN: 114 mg/dL — ABNORMAL HIGH (ref 6–20)
CO2: 33 mmol/L — AB (ref 22–32)
Calcium: 8.4 mg/dL — ABNORMAL LOW (ref 8.9–10.3)
Chloride: 116 mmol/L — ABNORMAL HIGH (ref 101–111)
Creatinine, Ser: 2.55 mg/dL — ABNORMAL HIGH (ref 0.44–1.00)
GFR calc Af Amer: 22 mL/min — ABNORMAL LOW (ref >60)
GFR calc non Af Amer: 19 mL/min — ABNORMAL LOW (ref >60)
GLUCOSE: 117 mg/dL — AB (ref 65–99)
POTASSIUM: 3.4 mmol/L — AB (ref 3.5–5.1)
Sodium: 157 mmol/L — ABNORMAL HIGH (ref 135–145)

## 2015-09-29 LAB — GLUCOSE, CAPILLARY
GLUCOSE-CAPILLARY: 109 mg/dL — AB (ref 65–99)
Glucose-Capillary: 113 mg/dL — ABNORMAL HIGH (ref 65–99)
Glucose-Capillary: 110 mg/dL — ABNORMAL HIGH (ref 65–99)
Glucose-Capillary: 110 mg/dL — ABNORMAL HIGH (ref 65–99)
Glucose-Capillary: 114 mg/dL — ABNORMAL HIGH (ref 65–99)
Glucose-Capillary: 120 mg/dL — ABNORMAL HIGH (ref 65–99)

## 2015-09-29 MED ORDER — FREE WATER
400.0000 mL | Freq: Four times a day (QID) | Status: DC
  Administered 2015-09-29 – 2015-09-30 (×3): 400 mL

## 2015-09-29 MED ORDER — POTASSIUM CHLORIDE 20 MEQ/15ML (10%) PO SOLN
40.0000 meq | Freq: Three times a day (TID) | ORAL | Status: AC
  Administered 2015-09-29 (×2): 40 meq
  Filled 2015-09-29 (×2): qty 30

## 2015-09-29 NOTE — Progress Notes (Signed)
PULMONARY / CRITICAL CARE MEDICINE   Name: Jacqueline Fuentes MRN: [id number] DOB: 22-Jul-1950    ADMISSION DATE:  09/20/2015 CONSULTATION DATE:  09/20/2015  REFERRING MD:  EDP  CHIEF COMPLAINT:  Anemia, shock  REVIEW OF SYSTEMS:   Unable  HPI: 65 year old unfortunate female. [facility]. Presented 3/15 with anemia and hypotension. Unable to transfuse effectively at [facility] due to multiple antibodies in blood. Could be just chronic anemia, but concern for GIB or hematuria.  SUBJECTIVE:   No events overnight.  Unresponsive.  VITAL SIGNS: BP 143/80 mmHg  Pulse 76  Temp(Src) 98.5 F (36.9 C) (Oral)  Resp 15  Ht 5\' 8"  (1.727 m)  Wt 91.173 kg (201 lb)  BMI 30.57 kg/m2  SpO2 100%  HEMODYNAMICS:    VENTILATOR SETTINGS: Vent Mode:  [-] PCV FiO2 (%):  [40 %] 40 % Set Rate:  [18 bmp] 18 bmp PEEP:  [5 cmH20] 5 cmH20 Plateau Pressure:  [18 cmH20-28 cmH20] 22 cmH20  INTAKE / OUTPUT: I/O last 3 completed shifts: In: 2748.7 [I.V.:460.4; NG/GT:2288.3] Out: 3720 [Urine:3710; Chest Tube:10]  PHYSICAL EXAMINATION: General:  Obese, chronically ill appearing female on  Vent via trach Neuro:  Does not follow commands, facial grimace only to painful stimuli. Contracted  HEENT:  Atraumatic, normocephalic, no discharge. 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   LABS:  BMET  Recent Labs Lab 09/28/15 0001 09/28/15 0540 09/29/15 0415  NA 155* 156* 157*  K 3.5 3.5 3.4*  CL 113* 111 116*  CO2 31 30 33*  BUN 131* 127* 114*  CREATININE 2.90* 2.71* 2.55*  GLUCOSE 144* 181* 117*   Electrolytes  Recent Labs Lab 09/26/15 0600 09/28/15 0001 09/28/15 0540 09/29/15 0415  CALCIUM 8.4* 8.4* 8.3* 8.4*  MG  2.2 2.0 1.9  --   PHOS 3.5  --  3.8  --    CBC  Recent Labs Lab 09/24/15 0459 09/26/15 0600 09/27/15 0511 09/28/15 0540  WBC 10.0 10.5  --  9.2  HGB 7.2* 6.6* 7.4* 7.3*  HCT 23.0* 21.4* 23.4* 24.3*  PLT 140* 146*  --  141*   Coag's No results for input(s): APTT, INR in the last 168 hours. Sepsis Markers No results for input(s): LATICACIDVEN, PROCALCITON, O2SATVEN in the last 168 hours. ABG No results for input(s): PHART, PCO2ART, PO2ART in the last 168 hours. Liver Enzymes No results for input(s): AST, ALT, ALKPHOS, BILITOT, ALBUMIN in the last 168 hours. Cardiac Enzymes  Recent Labs Lab 09/28/15 0001 09/28/15 0540 09/28/15 1330  TROPONINI 0.05* 0.06* 0.03   Glucose  Recent Labs Lab 09/28/15 1747 09/28/15 2035 09/28/15 2356 09/29/15 0446 09/29/15 0823 09/29/15 1311  GLUCAP 111* 122* 109* 113* 110* 110*   Imaging I reviewed CXR myself, trach in good position, pleural effusion.  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 CT chest but little to offer for interventions, therefore not ordered.  CARDIOVASCULAR A:  Hypotension secondary to ABLA Atrial Fibrillation Acute on chronic diastolic CHF P:  Telemetry monitoring Transfuse PRBC for hemoglobin less than 7 Start norvasc 5 mg PO daily for hypertension with holding parameters.  RENAL Lab Results  Component Value Date   CREATININE 2.55* 09/29/2015   CREATININE 2.71* 09/28/2015   CREATININE 2.90* 09/28/2015     Recent Labs Lab 09/28/15 0001 09/28/15 0540 09/29/15 0415  K 3.5 3.5 3.4*    Recent Labs Lab 09/28/15 0001 09/28/15 0540 09/29/15 0415  NA 155* 156* 157*   A:   Acute on CKD - appears prerenal, improving some over the last 48 hours L hydronephrosis with nephrostomy tube Chronic indwelling foley catheter Hypokalemia Hypocalcemia P:   Follow BMP  Saline lock Increase free water 400 ml q6 hours. Not a dialysis candidate Replace electrolytes as indicated.  GASTROINTESTINAL A:   GERD Possible GIB Severe protein calorie malnutrition  P:   Continue Protonix TF's running  HEMATOLOGIC  Recent Labs  09/27/15 0511 09/28/15 0540  HGB 7.4* 7.3*  A:   Acute blood loss on anemia of chronic illness. Possibly from GIB, hematuria P:  Serial CBC ? Return to [facility] soon. No real interventions available. Anemia treated, will communicate with case management.  INFECTIOUS A:   Possible sepsis, source unclear consider PNA, UTI, Sacral wound, or any number of chronic indwelling lines/tubes. P:   D/C abx after dose, course complete. Trend WBC and fever curves Follow Blood, urine and sputum culture Wound care consult to evaluate pressure ulcer  ENDOCRINE CBG (last 3)   Recent Labs  09/29/15 0446 09/29/15 0823 09/29/15 1311  GLUCAP 113* 110* 110*   A: No acute issues P:   Trend Glucose  NEUROLOGIC A:   Seizure disorder Anxiety/depression Chronic pain H/o several CVA P:   RASS goal: 0 PRN fentanyl Continue valproic acid,  Continue to hold lexapro, trazodone   Discussed with RT and case management, [facility] will have a bed for her on Tuesday.  Discussed with [facility]-MD, will accept patient on their service with PCCM as consult starting 3/25.  Rush Farmer, M.D. [facility] Pulmonary/Critical Care Medicine. Pager: [phone]. After hours pager: [phone].

## 2015-09-30 DIAGNOSIS — A419 Sepsis, unspecified organism: Secondary | ICD-10-CM | POA: Diagnosis present

## 2015-09-30 DIAGNOSIS — R569 Unspecified convulsions: Secondary | ICD-10-CM | POA: Diagnosis present

## 2015-09-30 DIAGNOSIS — Z9911 Dependence on respirator [ventilator] status: Secondary | ICD-10-CM | POA: Diagnosis present

## 2015-09-30 DIAGNOSIS — J90 Pleural effusion, not elsewhere classified: Secondary | ICD-10-CM

## 2015-09-30 DIAGNOSIS — I5033 Acute on chronic diastolic (congestive) heart failure: Secondary | ICD-10-CM | POA: Diagnosis present

## 2015-09-30 DIAGNOSIS — I10 Essential (primary) hypertension: Secondary | ICD-10-CM | POA: Diagnosis present

## 2015-09-30 DIAGNOSIS — J9611 Chronic respiratory failure with hypoxia: Secondary | ICD-10-CM | POA: Diagnosis present

## 2015-09-30 DIAGNOSIS — I482 Chronic atrial fibrillation, unspecified: Secondary | ICD-10-CM | POA: Diagnosis present

## 2015-09-30 DIAGNOSIS — N189 Chronic kidney disease, unspecified: Secondary | ICD-10-CM | POA: Diagnosis present

## 2015-09-30 DIAGNOSIS — R319 Hematuria, unspecified: Secondary | ICD-10-CM | POA: Diagnosis present

## 2015-09-30 DIAGNOSIS — N179 Acute kidney failure, unspecified: Secondary | ICD-10-CM | POA: Diagnosis present

## 2015-09-30 DIAGNOSIS — J189 Pneumonia, unspecified organism: Secondary | ICD-10-CM | POA: Diagnosis not present

## 2015-09-30 DIAGNOSIS — E43 Unspecified severe protein-calorie malnutrition: Secondary | ICD-10-CM | POA: Diagnosis present

## 2015-09-30 LAB — CBC
HCT: 25.8 % — ABNORMAL LOW (ref 36.0–46.0)
HEMOGLOBIN: 8 g/dL — AB (ref 12.0–15.0)
MCH: 27.5 pg (ref 26.0–34.0)
MCHC: 31 g/dL (ref 30.0–36.0)
MCV: 88.7 fL (ref 78.0–100.0)
PLATELETS: 153 10*3/uL (ref 150–400)
RBC: 2.91 MIL/uL — ABNORMAL LOW (ref 3.87–5.11)
RDW: 18.8 % — ABNORMAL HIGH (ref 11.5–15.5)
WBC: 13.3 10*3/uL — ABNORMAL HIGH (ref 4.0–10.5)

## 2015-09-30 LAB — TYPE AND SCREEN
ABO/RH(D): O POS
Antibody Screen: POSITIVE
DAT, IgG: NEGATIVE
UNIT DIVISION: 0
Unit division: 0

## 2015-09-30 LAB — GLUCOSE, CAPILLARY
GLUCOSE-CAPILLARY: 110 mg/dL — AB (ref 65–99)
GLUCOSE-CAPILLARY: 117 mg/dL — AB (ref 65–99)
GLUCOSE-CAPILLARY: 118 mg/dL — AB (ref 65–99)
GLUCOSE-CAPILLARY: 124 mg/dL — AB (ref 65–99)
Glucose-Capillary: 115 mg/dL — ABNORMAL HIGH (ref 65–99)
Glucose-Capillary: 115 mg/dL — ABNORMAL HIGH (ref 65–99)

## 2015-09-30 LAB — BASIC METABOLIC PANEL
Anion gap: 13 (ref 5–15)
BUN: 108 mg/dL — AB (ref 6–20)
CHLORIDE: 119 mmol/L — AB (ref 101–111)
CO2: 27 mmol/L (ref 22–32)
CREATININE: 2.43 mg/dL — AB (ref 0.44–1.00)
Calcium: 8.6 mg/dL — ABNORMAL LOW (ref 8.9–10.3)
GFR calc Af Amer: 23 mL/min — ABNORMAL LOW (ref >60)
GFR calc non Af Amer: 20 mL/min — ABNORMAL LOW (ref >60)
Glucose, Bld: 119 mg/dL — ABNORMAL HIGH (ref 65–99)
Potassium: 5.3 mmol/L — ABNORMAL HIGH (ref 3.5–5.1)
SODIUM: 159 mmol/L — AB (ref 135–145)

## 2015-09-30 LAB — CBC WITH DIFFERENTIAL/PLATELET
BASOS ABS: 0 10*3/uL (ref 0.0–0.1)
BASOS PCT: 0 %
EOS ABS: 0 10*3/uL (ref 0.0–0.7)
EOS PCT: 0 %
HCT: 25.8 % — ABNORMAL LOW (ref 36.0–46.0)
Hemoglobin: 7.7 g/dL — ABNORMAL LOW (ref 12.0–15.0)
Lymphocytes Relative: 12 %
Lymphs Abs: 1.2 10*3/uL (ref 0.7–4.0)
MCH: 26.4 pg (ref 26.0–34.0)
MCHC: 29.8 g/dL — AB (ref 30.0–36.0)
MCV: 88.4 fL (ref 78.0–100.0)
MONO ABS: 0.6 10*3/uL (ref 0.1–1.0)
Monocytes Relative: 6 %
Neutro Abs: 8.8 10*3/uL — ABNORMAL HIGH (ref 1.7–7.7)
Neutrophils Relative %: 82 %
PLATELETS: 143 10*3/uL — AB (ref 150–400)
RBC: 2.92 MIL/uL — ABNORMAL LOW (ref 3.87–5.11)
RDW: 18.9 % — AB (ref 11.5–15.5)
WBC: 10.6 10*3/uL — ABNORMAL HIGH (ref 4.0–10.5)

## 2015-09-30 LAB — COMPREHENSIVE METABOLIC PANEL
ALK PHOS: 66 U/L (ref 38–126)
ALT: 8 U/L — AB (ref 14–54)
AST: 11 U/L — AB (ref 15–41)
Albumin: 1.4 g/dL — ABNORMAL LOW (ref 3.5–5.0)
Anion gap: 11 (ref 5–15)
BILIRUBIN TOTAL: 0.6 mg/dL (ref 0.3–1.2)
BUN: 100 mg/dL — AB (ref 6–20)
CALCIUM: 8.4 mg/dL — AB (ref 8.9–10.3)
CO2: 29 mmol/L (ref 22–32)
CREATININE: 2.19 mg/dL — AB (ref 0.44–1.00)
Chloride: 119 mmol/L — ABNORMAL HIGH (ref 101–111)
GFR calc Af Amer: 26 mL/min — ABNORMAL LOW (ref >60)
GFR, EST NON AFRICAN AMERICAN: 22 mL/min — AB (ref >60)
Glucose, Bld: 120 mg/dL — ABNORMAL HIGH (ref 65–99)
POTASSIUM: 4.1 mmol/L (ref 3.5–5.1)
Sodium: 159 mmol/L — ABNORMAL HIGH (ref 135–145)
TOTAL PROTEIN: 7 g/dL (ref 6.5–8.1)

## 2015-09-30 LAB — LACTIC ACID, PLASMA: Lactic Acid, Venous: 0.7 mmol/L (ref 0.5–2.0)

## 2015-09-30 LAB — MAGNESIUM: MAGNESIUM: 1.8 mg/dL (ref 1.7–2.4)

## 2015-09-30 MED ORDER — AMLODIPINE BESYLATE 10 MG PO TABS
10.0000 mg | ORAL_TABLET | Freq: Every day | ORAL | Status: DC
  Administered 2015-10-01 – 2015-10-10 (×10): 10 mg
  Filled 2015-09-30 (×2): qty 1
  Filled 2015-09-30: qty 2
  Filled 2015-09-30 (×7): qty 1

## 2015-09-30 MED ORDER — FREE WATER
400.0000 mL | Status: DC
  Administered 2015-09-30 – 2015-10-03 (×17): 400 mL

## 2015-09-30 MED ORDER — MAGNESIUM SULFATE 2 GM/50ML IV SOLN
2.0000 g | Freq: Once | INTRAVENOUS | Status: AC
  Administered 2015-09-30: 2 g via INTRAVENOUS
  Filled 2015-09-30: qty 50

## 2015-09-30 MED ORDER — AMLODIPINE BESYLATE 5 MG PO TABS
5.0000 mg | ORAL_TABLET | Freq: Every day | ORAL | Status: DC
  Administered 2015-09-30: 5 mg via ORAL
  Filled 2015-09-30: qty 1

## 2015-09-30 NOTE — Progress Notes (Signed)
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 Seizure disorder, multiple CVAs, Atrial Fibrillation, Chronic Diastolic CHF CKD, Sacral Decubitus, Rt sided Pleural Effusion with Pleur-X in place, and Lt Hydronephrosis with Chronic Nephrostomy tube.   She has also recently been treated for funguria with diflucan, pseudomonas PNA with ertapenem, and sacral wound has tested positive for MRSA. She was recently started on cefepime and vancomycin, for what is reportedly a MRSA PNA. She has also been requiring frequent blood transfusions for what is thought to be progression of her chronic anemia, however, she has multiple antibodies in her blood and is difficult to find transfusions for. [facility] has listed her as futility of care due to this anemia, however, husband has reportedly been very reluctant to address goals of care.   3/15 Jacqueline Fuentes developed hypotension and hemoglobin was noted to be 4.2. She was transfused 1 unit PRBC, which was all that was available to [facility] at the time. Husband was contacted and requested a transfer to the emergency department so that she could receive further transfusions. In the emergency department she was noted to be febrile with leukocytosis. Hemoglobin was confirmed to be low at 5. Transfusions were ordered by ED staff, however, blood bank is unsure they will be able to accommodate this request due to her cross matching difficulties. Broad spectrum antibiotics were resumed.  HPI/Subjective: 3/25 afebrile overnight, A/O 0, grimaces to painful stimuli, follows no commands,  Assessment/Plan: Chronic respiratory failure with ventilator dependence -Per [facility]  Recurrent Right effusion with PleurX Per [facility]  Hypotension secondary to Acute Blood Loss  Anemia/Essential hypertension -Now hypertensive -Amlodipine 5 mg 1 -Increase amlodipine 10 mg daily -Occult blood card pending  Atrial Fibrillation -Amiodarone 200 mg daily  Acute on chronic Diastolic CHF -Transfuse for hemoglobin< 7 (secondary to patient needing difficult blood to obtain, cross matching difficulties) -No echocardiogram.available. Echocardiogram pending  Acute on CKD (baseline Cr= 0.97 obtained 06/30/15)  -Lt  hydronephrosis with nephrostomy tube (chronic) -Chronic indwelling foley catheter -Not a dialysis candidate  Acute blood loss anemia/Hematuria -Possible GIB. Code blood card pending -Urinalysis pending (urine Tea colored),. -Protonix IV BID -No real interventions available.   Severe protein calorie malnutrition  -TF's running  Sepsis unspecified organism, source unclear consider . -Multifactorial PNA, UTI, Sacral wound, or any number of chronic indwelling lines/tubes -Course antibiotics complete D/C abx after dose, course complete. -Trend WBC and fever curves -Follow Blood, urine and sputum culture -Wound care consult to evaluate pressure ulcer  Seizure disorder -Valproic acid, 125 mg BID  Anxiety/depression Continue to hold lexapro, trazodone    H/o several CVA  Hypomagnesemia -Magnesium goal> 2 -Magnesium IV 2 gm  Hypernatremia -Increase Free water 455m q 4 hr  Chronic pain   Discussed with RT and case management, [facility] will have a bed for her on Tuesday.    Goals of care -Consult to palliative care; per admission note [facility] has labeled any further aggressive care as futileMultisystem organ failure, quadriplegia, discuss change of code, short-term vs long-term goals of care. Hospice?   Code Status: FULL Family Communication: no family present at time of exam Disposition Plan: [facility] on Tuesday    Consultants: [place]esam GKathryne SharperPCCM   Procedure/Significant Events: 3/15 transfused 1  unit PRBC, at [facility] 3/16 transfused 2  units PRBC 3/17 transfused 1 unit PRBC 3/21 transfused 1unit PRBC    Culture 3/15 blood right hand/forearm negative final Urine 3/16 >neg 3/25 trach aspirate pending    Antibiotics: Meropenem 3/16 >3/22 Vancomycin 3/16 >3/22  DVT prophylaxis: SCD   Devices   LINES / TUBES:  Trach #7 cuffed >> RUE PICC 2/3 >>> Rt chest tube>>    Continuous Infusions: . feeding supplement (VITAL AF 1.2 CAL) 1,000 mL (09/30/15 0529)    Objective: VITAL SIGNS: Temp: 99 F (37.2 C) (03/25 1912) Temp Source: Oral (03/25 1912) BP: 179/96 mmHg (03/25 1932) Pulse Rate: 91 (03/25 1932) SPO2; FIO2:   Intake/Output Summary (Last 24 hours) at 09/30/15 1948 Last data filed at 09/30/15 1913  Gross per 24 hour  Intake   2090 ml  Output   2275 ml  Net   -185 ml     Exam: General: A/O 0, grimaces to painful stimuli, follows no commands, No acute respiratory distress Eyes:  negative scleral hemorrhage ENT: Negative Runny nose, negative gingival bleeding, Neck:  Negative scars, masses, torticollis, lymphadenopathy, JVD Lungs: coarse breath sounds bilateral (on ventilator), without wheezes or crackles Cardiovascular: Tachycardic, Regular rhythm without murmur gallop or rub normal S1 and S2 Abdomen: Morbidly obese, negative abdominal pain, nondistended, positive soft, bowel sounds, no rebound, no ascites, no appreciable mass, PEG tube in place negative sign of infection. Left nephrostomy tube in place draining minimal Tea colored fluid. GU; hematuria present Extremities: all extremities mildly to moderately contracted does not withdraw to painful stimuli  Psychiatric: Unable to assess  Neurologic:  No spontaneous movement of extremities, no withdrawal to painful stimuli of extremities.     Data Reviewed: Basic Metabolic Panel:  Recent Labs Lab 09/26/15 0600 09/28/15 0001 09/28/15 0540 09/29/15 0415 09/30/15 0609 09/30/15 0905  NA 153*  155* 156* 157* 159* 159*  K 3.6 3.5 3.5 3.4* 5.3* 4.1  CL 109 113* 111 116* 119* 119*  CO2 _0 33* 27 29  GLUCOSE 140* 144* 181* 117* 119* 120*  BUN 166* 131* 127* 114* 108* 100*  CREATININE 3.53* 2.90* 2.71* 2.55* 2.43* 2.19*  CALCIUM 8.4* 8.4* 8.3* 8.4* 8.6* 8.4*  MG 2.2 2.0 1.9  --   --  1.8  PHOS 3.5  --  3.8  --   --   --    Liver Function Tests:  Recent Labs Lab 09/30/15 0905  AST 11*  ALT 8*  ALKPHOS 66  BILITOT 0.6  PROT 7.0  ALBUMIN 1.4*   No results for input(s): LIPASE, AMYLASE in the last 168 hours. No results for input(s): AMMONIA in the last 168 hours. CBC:  Recent Labs Lab 09/24/15 0459 09/26/15 0600 09/27/15 0511 09/28/15 0540 09/30/15 0609 09/30/15 0905  WBC 10.0 10.5  --  9.2 13.3* 10.6*  NEUTROABS  --   --   --   --   --  8.8*  HGB 7.2* 6.6* 7.4* 7.3* 8.0* 7.7*  HCT 23.0* 21.4* 23.4* 24.3* 25.8* 25.8*  MCV 84.2 85.6  --  88.0 88.7 88.4  PLT 140* 146*  --  141* 153 143*   Cardiac Enzymes:  Recent Labs Lab 09/28/15 0001 09/28/15 0540 09/28/15 1330  CKTOTAL 15*  --   --   CKMB 1.5  --   --   TROPONINI 0.05* 0.06* 0.03   BNP (last 3 results)  Recent Labs  06/30/15 0018 09/20/15 2306  BNP 402.9* 249.6*    ProBNP (last 3 results) No results for input(s): PROBNP  in the last 8760 hours.  CBG:  Recent Labs Lab 09/30/15 0039 09/30/15 0428 09/30/15 0743 09/30/15 1200 09/30/15 1640  GLUCAP 118* 115* 110* 115* 117*    Recent Results (from the past 240 hour(s))  Blood Culture (routine x 2)     Status: None   Collection Time: 09/20/15 11:06 PM  Result Value Ref Range Status   Specimen Description BLOOD RIGHT HAND  Final   Special Requests BOTTLES DRAWN AEROBIC AND ANAEROBIC 6ML  Final   Culture NO GROWTH 5 DAYS  Final   Report Status 09/26/2015 FINAL  Final  Blood Culture (routine x 2)     Status: None   Collection Time: 09/20/15 11:45 PM  Result Value Ref Range Status   Specimen Description BLOOD RIGHT FOREARM  Final    Special Requests BOTTLES DRAWN AEROBIC AND ANAEROBIC 5ML  Final   Culture NO GROWTH 5 DAYS  Final   Report Status 09/26/2015 FINAL  Final  Urine culture     Status: None   Collection Time: 09/21/15 12:25 AM  Result Value Ref Range Status   Specimen Description URINE, CATHETERIZED  Final   Special Requests NONE  Final   Culture NO GROWTH 1 DAY  Final   Report Status 09/22/2015 FINAL  Final  MRSA PCR Screening     Status: None   Collection Time: 09/21/15  3:00 AM  Result Value Ref Range Status   MRSA by PCR NEGATIVE NEGATIVE Final    Comment:        The GeneXpert MRSA Assay (FDA approved for NASAL specimens only), is one component of a comprehensive MRSA colonization surveillance program. It is not intended to diagnose MRSA infection nor to guide or monitor treatment for MRSA infections.      Studies:  Recent x-ray studies have been reviewed in detail by the Attending Physician  Scheduled Meds:  Scheduled Meds: . sodium chloride   Intravenous Once  . amiodarone  200 mg Oral Daily  . [START ON 10/01/2015] amLODipine  10 mg Per Tube Daily  . amLODipine  5 mg Oral Daily  . antiseptic oral rinse  7 mL Mouth Rinse QID  . chlorhexidine gluconate (SAGE KIT)  15 mL Mouth Rinse BID  . collagenase   Topical Daily  . darbepoetin (ARANESP) injection - NON-DIALYSIS  60 mcg Subcutaneous Q Thu-1800  . free water  400 mL Per Tube Q4H  . insulin aspart  0-9 Units Subcutaneous TID WC  . pantoprazole (PROTONIX) IV  40 mg Intravenous Q12H  . sodium chloride flush  3 mL Intravenous Q12H  . valproic acid  125 mg Per Tube BID    Time spent on care of this patient: 40 mins   WOODS, Geraldo Docker , MD  [facility] Office  [phone] Pager - [phone]  On-Call/Text Page:      [email or web address]      password [email or web address]  If 7PM-7AM, please contact night-coverage [email or web address] Password [facility] 09/30/2015, 7:48 PM   LOS: 9 days   Care during the described time interval was provided by me .   I have reviewed this patient's available data, including medical history, events of note, physical examination, and all test results as part of my evaluation. I have personally reviewed and interpreted all radiology studies.   Dia Crawford, MD [phone] Pager

## 2015-10-01 ENCOUNTER — Inpatient Hospital Stay (HOSPITAL_COMMUNITY): Payer: Medicare Other

## 2015-10-01 DIAGNOSIS — I5033 Acute on chronic diastolic (congestive) heart failure: Secondary | ICD-10-CM | POA: Diagnosis not present

## 2015-10-01 DIAGNOSIS — I482 Chronic atrial fibrillation: Secondary | ICD-10-CM | POA: Diagnosis not present

## 2015-10-01 DIAGNOSIS — F329 Major depressive disorder, single episode, unspecified: Secondary | ICD-10-CM

## 2015-10-01 DIAGNOSIS — D62 Acute posthemorrhagic anemia: Secondary | ICD-10-CM | POA: Diagnosis not present

## 2015-10-01 DIAGNOSIS — I7101 Dissection of thoracic aorta: Secondary | ICD-10-CM | POA: Diagnosis not present

## 2015-10-01 DIAGNOSIS — A419 Sepsis, unspecified organism: Secondary | ICD-10-CM | POA: Diagnosis not present

## 2015-10-01 DIAGNOSIS — L89150 Pressure ulcer of sacral region, unstageable: Secondary | ICD-10-CM

## 2015-10-01 DIAGNOSIS — E870 Hyperosmolality and hypernatremia: Secondary | ICD-10-CM | POA: Diagnosis present

## 2015-10-01 DIAGNOSIS — R579 Shock, unspecified: Secondary | ICD-10-CM | POA: Diagnosis not present

## 2015-10-01 DIAGNOSIS — I509 Heart failure, unspecified: Secondary | ICD-10-CM

## 2015-10-01 DIAGNOSIS — E43 Unspecified severe protein-calorie malnutrition: Secondary | ICD-10-CM | POA: Diagnosis not present

## 2015-10-01 DIAGNOSIS — F32A Depression, unspecified: Secondary | ICD-10-CM | POA: Diagnosis present

## 2015-10-01 DIAGNOSIS — L89159 Pressure ulcer of sacral region, unspecified stage: Secondary | ICD-10-CM | POA: Diagnosis present

## 2015-10-01 DIAGNOSIS — J189 Pneumonia, unspecified organism: Secondary | ICD-10-CM | POA: Diagnosis not present

## 2015-10-01 DIAGNOSIS — G40909 Epilepsy, unspecified, not intractable, without status epilepticus: Secondary | ICD-10-CM | POA: Diagnosis present

## 2015-10-01 DIAGNOSIS — F411 Generalized anxiety disorder: Secondary | ICD-10-CM | POA: Diagnosis present

## 2015-10-01 LAB — GLUCOSE, CAPILLARY
GLUCOSE-CAPILLARY: 105 mg/dL — AB (ref 65–99)
GLUCOSE-CAPILLARY: 106 mg/dL — AB (ref 65–99)
GLUCOSE-CAPILLARY: 124 mg/dL — AB (ref 65–99)
Glucose-Capillary: 94 mg/dL (ref 65–99)
Glucose-Capillary: 100 mg/dL — ABNORMAL HIGH (ref 65–99)
Glucose-Capillary: 117 mg/dL — ABNORMAL HIGH (ref 65–99)
Glucose-Capillary: 98 mg/dL (ref 65–99)

## 2015-10-01 LAB — URINALYSIS, ROUTINE W REFLEX MICROSCOPIC
Bilirubin Urine: NEGATIVE
GLUCOSE, UA: NEGATIVE mg/dL
KETONES UR: 15 mg/dL — AB
NITRITE: NEGATIVE
PH: 7 (ref 5.0–8.0)
Protein, ur: 100 mg/dL — AB
SPECIFIC GRAVITY, URINE: 1.014 (ref 1.005–1.030)

## 2015-10-01 LAB — CBC WITH DIFFERENTIAL/PLATELET
BASOS ABS: 0 10*3/uL (ref 0.0–0.1)
BASOS ABS: 0 10*3/uL (ref 0.0–0.1)
Basophils Relative: 0 %
Basophils Relative: 0 %
EOS ABS: 0 10*3/uL (ref 0.0–0.7)
EOS PCT: 0 %
Eosinophils Absolute: 0 10*3/uL (ref 0.0–0.7)
Eosinophils Relative: 0 %
HCT: 23.7 % — ABNORMAL LOW (ref 36.0–46.0)
HEMATOCRIT: 25.1 % — AB (ref 36.0–46.0)
Hemoglobin: 7.4 g/dL — ABNORMAL LOW (ref 12.0–15.0)
Hemoglobin: 7.2 g/dL — ABNORMAL LOW (ref 12.0–15.0)
LYMPHS ABS: 1.1 10*3/uL (ref 0.7–4.0)
LYMPHS PCT: 10 %
LYMPHS PCT: 12 %
Lymphs Abs: 1.5 10*3/uL (ref 0.7–4.0)
MCH: 26.1 pg (ref 26.0–34.0)
MCH: 26.8 pg (ref 26.0–34.0)
MCHC: 29.5 g/dL — ABNORMAL LOW (ref 30.0–36.0)
MCHC: 30.4 g/dL (ref 30.0–36.0)
MCV: 88.4 fL (ref 78.0–100.0)
MCV: 88.1 fL (ref 78.0–100.0)
MONO ABS: 0.9 10*3/uL (ref 0.1–1.0)
MONO ABS: 0.8 10*3/uL (ref 0.1–1.0)
Monocytes Relative: 8 %
Monocytes Relative: 7 %
NEUTROS ABS: 9.2 10*3/uL — AB (ref 1.7–7.7)
Neutro Abs: 10.2 10*3/uL — ABNORMAL HIGH (ref 1.7–7.7)
Neutrophils Relative %: 82 %
Neutrophils Relative %: 81 %
PLATELETS: 137 10*3/uL — AB (ref 150–400)
Platelets: 142 10*3/uL — ABNORMAL LOW (ref 150–400)
RBC: 2.84 MIL/uL — AB (ref 3.87–5.11)
RBC: 2.69 MIL/uL — AB (ref 3.87–5.11)
RDW: 18.8 % — AB (ref 11.5–15.5)
RDW: 18.9 % — ABNORMAL HIGH (ref 11.5–15.5)
WBC: 11.2 10*3/uL — ABNORMAL HIGH (ref 4.0–10.5)
WBC: 12.5 10*3/uL — AB (ref 4.0–10.5)

## 2015-10-01 LAB — URINE MICROSCOPIC-ADD ON: BACTERIA UA: NONE SEEN

## 2015-10-01 LAB — COMPREHENSIVE METABOLIC PANEL
ALBUMIN: 1.4 g/dL — AB (ref 3.5–5.0)
ALT: 8 U/L — AB (ref 14–54)
AST: 12 U/L — AB (ref 15–41)
Alkaline Phosphatase: 60 U/L (ref 38–126)
Anion gap: 10 (ref 5–15)
BILIRUBIN TOTAL: 0.8 mg/dL (ref 0.3–1.2)
BUN: 84 mg/dL — AB (ref 6–20)
CALCIUM: 8.4 mg/dL — AB (ref 8.9–10.3)
CO2: 29 mmol/L (ref 22–32)
CREATININE: 2.1 mg/dL — AB (ref 0.44–1.00)
Chloride: 119 mmol/L — ABNORMAL HIGH (ref 101–111)
GFR calc Af Amer: 27 mL/min — ABNORMAL LOW (ref >60)
GFR, EST NON AFRICAN AMERICAN: 24 mL/min — AB (ref >60)
GLUCOSE: 110 mg/dL — AB (ref 65–99)
POTASSIUM: 4.1 mmol/L (ref 3.5–5.1)
Sodium: 158 mmol/L — ABNORMAL HIGH (ref 135–145)
TOTAL PROTEIN: 7.2 g/dL (ref 6.5–8.1)

## 2015-10-01 LAB — ECHOCARDIOGRAM COMPLETE
Height: 68 in
Weight: 3280 oz

## 2015-10-01 LAB — MAGNESIUM: Magnesium: 2 mg/dL (ref 1.7–2.4)

## 2015-10-01 LAB — OCCULT BLOOD X 1 CARD TO LAB, STOOL: Fecal Occult Bld: NEGATIVE

## 2015-10-01 MED ORDER — ALTEPLASE 2 MG IJ SOLR: 2.0000 mg | Freq: Once | INTRAMUSCULAR | Status: DC

## 2015-10-01 MED ORDER — DILTIAZEM HCL 25 MG/5ML IV SOLN
10.0000 mg | Freq: Once | INTRAVENOUS | Status: AC
  Administered 2015-10-01: 10 mg via INTRAVENOUS
  Filled 2015-10-01: qty 5

## 2015-10-01 MED ORDER — METOPROLOL TARTRATE 25 MG/10 ML ORAL SUSPENSION
12.5000 mg | Freq: Two times a day (BID) | ORAL | Status: DC
  Administered 2015-10-01 – 2015-10-07 (×13): 12.5 mg
  Filled 2015-10-01 (×13): qty 10

## 2015-10-01 NOTE — Progress Notes (Signed)
  Echocardiogram 2D Echocardiogram has been performed.  Jacqueline Fuentes 10/01/2015, 2:48 PM

## 2015-10-01 NOTE — Progress Notes (Signed)
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 Seizure disorder, multiple CVAs, Atrial Fibrillation, Chronic Diastolic CHF CKD, Sacral Decubitus, Rt sided Pleural Effusion with Pleur-X in place, and Lt Hydronephrosis with Chronic Nephrostomy tube.   She has also recently been treated for funguria with diflucan, pseudomonas PNA with ertapenem, and sacral wound has tested positive for MRSA. She was recently started on cefepime and vancomycin, for what is reportedly a MRSA PNA. She has also been requiring frequent blood transfusions for what is thought to be progression of her chronic anemia, however, she has multiple antibodies in her blood and is difficult to find transfusions for. [facility] has listed her as futility of care due to this anemia, however, husband has reportedly been very reluctant to address goals of care.   3/15 Jacqueline Fuentes developed hypotension and hemoglobin was noted to be 4.2. She was transfused 1 unit PRBC, which was all that was available to [facility] at the time. Husband was contacted and requested a transfer to the emergency department so that she could receive further transfusions. In the emergency department she was noted to be febrile with leukocytosis. Hemoglobin was confirmed to be low at 5. Transfusions were ordered by ED staff, however, blood bank is unsure they will be able to accommodate this request due to her cross matching difficulties. Broad spectrum antibiotics were resumed.  HPI/Subjective: 3/26 Tmax overnight 37.8C, A/O 0, grimaces to painful stimuli, follows no commands,  Assessment/Plan: Chronic respiratory failure with ventilator dependence -Per [facility] M -Continue below settings on vent.  Recurrent Right effusion with PleurX Per PCCM  Essential  hypertension -Amlodipine 10 mg daily -Occult blood card pending  Atrial Fibrillation -Amiodarone 200 mg daily -Metoprolol 12.5 mg/tube BID  Acute on chronic Diastolic CHF -Transfuse for hemoglobin< 7 (secondary to patient needing difficult blood to obtain, cross matching difficulties) -No echocardiogram.available. Echocardiogram pending  Acute on CKD (baseline Cr= 0.97 obtained 06/30/15)  -Lt  hydronephrosis with nephrostomy tube (chronic) -Chronic indwelling foley catheter -Not a dialysis candidate  Acute blood loss anemia/Hematuria -Possible GIB. Code blood card pending -Urinalysis pending (urine Tea colored),. -Protonix IV BID -No real interventions available.   Severe protein calorie malnutrition  -TF's running  Sepsis unspecified organism, source unclear consider . -Multifactorial PNA, UTI, Sacral wound, or any number of chronic indwelling lines/tubes -Course antibiotics complete D/C abx. -Trend WBC and fever curves -Follow Blood, urine and sputum culture  Seizure disorder -Valproic acid, 125 mg BID  Anxiety/depression Continue to hold lexapro, trazodone    H/o several CVA  Hypomagnesemia -Magnesium goal> 2 -Magnesium IV 2 gm  Hypernatremia -Increase Free water 464m q 4 hr  Osteomyelitis/Sacral Decubitus Ulcer unstageable -Obtain L/S spine x-ray -PT for Hydrotherapy daily -[place] post Hydrotherapy   Chronic pain   Discussed with RT and case management, [facility] will have a bed for her on Tuesday.    Goals of care -Consult to palliative care; per admission note [facility] has labeled any further aggressive care as futileMultisystem organ failure, quadriplegia, discuss change of code, short-term vs long-term goals of care. Hospice?   Code Status: FULL Family Communication: no family present at time of exam Disposition Plan: [facility] on Tuesday    Consultants: [facility] PCCM   Procedure/Significant Events: 3/15 transfused 1 unit  PRBC, at [facility] 3/16 transfused 2 units PRBC 3/17 transfused 1 unit PRBC 3/21 transfused 1unit PRBC    Culture 3/15 blood right hand/forearm negative final Urine 3/16 >neg 3/25 trach aspirate pending    Antibiotics: Meropenem 3/16 >3/22 Vancomycin 3/16 >3/22  DVT prophylaxis: SCD   Devices   LINES / TUBES:  Trach #7 cuffed >> RUE PICC 2/3 >>> Rt chest tube>>    Continuous Infusions: . feeding supplement (VITAL AF 1.2 CAL) 1,000 mL (10/01/15 0651)    Objective: VITAL SIGNS: Temp: 98.6 F (37 C) (03/26 1137) Temp Source: Oral (03/26 1137) BP: 145/78 mmHg (03/26 1300) Pulse Rate: 91 (03/26 1300) Vent mode; PCV SPO2; 100% FIO2: 40% PC; 25 PEEP; 5 RR spontaneous; 7 RR total; 25     Intake/Output Summary (Last 24 hours) at 10/01/15 1315 Last data filed at 10/01/15 1300  Gross per 24 hour  Intake   3203 ml  Output   2475 ml  Net    728 ml     Exam: General: A/O 0, grimaces to painful stimuli, follows no commands, No acute respiratory distress Eyes:  negative scleral hemorrhage ENT: Negative Runny nose, negative gingival bleeding, Neck:  Negative scars, masses, torticollis, lymphadenopathy, JVD Lungs: coarse breath sounds bilateral (on ventilator), without wheezes or crackles Cardiovascular: Tachycardic, Regular rhythm without murmur gallop or rub normal S1 and S2 Abdomen: Morbidly obese, negative abdominal pain, nondistended, positive soft, bowel sounds, no rebound, no ascites, no appreciable mass, PEG tube in place negative sign of infection. Left nephrostomy tube in place draining minimal Tea colored fluid. GU; hematuria present; LARGE SACRAL DECUBITUS ULCER, 9 INCHES IN DIAMETER, tracking cephalad 6 cm, large amount of serosanguineous fluid, purulent discharge. Smaller ulcer posterior right thigh stage I Extremities: all extremities mildly to moderately contracted does not withdraw to  painful stimuli  Psychiatric: Unable to assess  Neurologic:  No spontaneous movement of extremities, no withdrawal to painful stimuli of extremities.     Data Reviewed: Basic Metabolic Panel:  Recent Labs Lab 09/26/15 0600 09/28/15 0001 09/28/15 0540 09/29/15 0415 09/30/15 0609 09/30/15 0905  NA 153* 155* 156* 157* 159* 159*  K 3.6 3.5 3.5 3.4* 5.3* 4.1  CL 109 113* 111 116* 119* 119*  CO2 _0 33* 27 29  GLUCOSE 140* 144* 181* 117* 119* 120*  BUN 166* 131* 127* 114* 108* 100*  CREATININE 3.53* 2.90* 2.71* 2.55* 2.43* 2.19*  CALCIUM 8.4* 8.4* 8.3* 8.4* 8.6* 8.4*  MG 2.2 2.0 1.9  --   --  1.8  PHOS 3.5  --  3.8  --   --   --    Liver Function Tests:  Recent Labs Lab 09/30/15 0905  AST 11*  ALT 8*  ALKPHOS 66  BILITOT 0.6  PROT 7.0  ALBUMIN 1.4*   No results for input(s): LIPASE, AMYLASE in the last 168 hours. No results for input(s): AMMONIA in the last 168 hours. CBC:  Recent Labs Lab 09/26/15 0600 09/27/15 0511 09/28/15 0540 09/30/15 0609 09/30/15 0905 10/01/15 0325  WBC 10.5  --  9.2 13.3* 10.6* 11.2*  NEUTROABS  --   --   --   --  8.8* 9.2*  HGB 6.6* 7.4* 7.3* 8.0* 7.7* 7.4*  HCT 21.4* 23.4* 24.3* 25.8* 25.8* 25.1*  MCV 85.6  --  88.0 88.7 88.4 88.4  PLT 146*  --  141* 153 143* 142*   Cardiac Enzymes:  Recent Labs Lab 09/28/15 0001 09/28/15 0540 09/28/15 1330  CKTOTAL 15*  --   --  CKMB 1.5  --   --   TROPONINI 0.05* 0.06* 0.03   BNP (last 3 results)  Recent Labs  06/30/15 0018 09/20/15 2306  BNP 402.9* 249.6*    ProBNP (last 3 results) No results for input(s): PROBNP in the last 8760 hours.  CBG:  Recent Labs Lab 09/30/15 2356 10/01/15 0154 10/01/15 0423 10/01/15 0756 10/01/15 1222  GLUCAP 105* 94 100* 106* 124*    No results found for this or any previous visit (from the past 240 hour(s)).   Studies:  Recent x-ray studies have been reviewed in detail by the Attending Physician  Scheduled Meds:  Scheduled  Meds: . sodium chloride   Intravenous Once  . amiodarone  200 mg Oral Daily  . amLODipine  10 mg Per Tube Daily  . antiseptic oral rinse  7 mL Mouth Rinse QID  . chlorhexidine gluconate (SAGE KIT)  15 mL Mouth Rinse BID  . collagenase   Topical Daily  . darbepoetin (ARANESP) injection - NON-DIALYSIS  60 mcg Subcutaneous Q Thu-1800  . free water  400 mL Per Tube Q4H  . insulin aspart  0-9 Units Subcutaneous TID WC  . metoprolol tartrate  12.5 mg Per Tube BID  . pantoprazole (PROTONIX) IV  40 mg Intravenous Q12H  . sodium chloride flush  3 mL Intravenous Q12H  . valproic acid  125 mg Per Tube BID    Time spent on care of this patient: 40 mins   WOODS, Geraldo Docker , MD  [facility] Office  [phone] Pager - [phone]  On-Call/Text Page:      [email or web address]      password [email or web address]  If 7PM-7AM, please contact night-coverage [email or web address] Password [email or web address] 10/01/2015, 1:15 PM   LOS: 10 days   Care during the described time interval was provided by me .  I have reviewed this patient's available data, including medical history, events of note, physical examination, and all test results as part of my evaluation. I have personally reviewed and interpreted all radiology studies.   Dia Crawford, MD [phone] Pager

## 2015-10-02 DIAGNOSIS — J9611 Chronic respiratory failure with hypoxia: Secondary | ICD-10-CM | POA: Diagnosis not present

## 2015-10-02 DIAGNOSIS — A419 Sepsis, unspecified organism: Secondary | ICD-10-CM | POA: Diagnosis not present

## 2015-10-02 DIAGNOSIS — E870 Hyperosmolality and hypernatremia: Secondary | ICD-10-CM

## 2015-10-02 DIAGNOSIS — J189 Pneumonia, unspecified organism: Secondary | ICD-10-CM | POA: Diagnosis not present

## 2015-10-02 DIAGNOSIS — D62 Acute posthemorrhagic anemia: Secondary | ICD-10-CM | POA: Diagnosis not present

## 2015-10-02 LAB — CBC WITH DIFFERENTIAL/PLATELET
BASOS ABS: 0 10*3/uL (ref 0.0–0.1)
Basophils Relative: 0 %
Eosinophils Absolute: 0 10*3/uL (ref 0.0–0.7)
Eosinophils Relative: 0 %
HEMATOCRIT: 24.2 % — AB (ref 36.0–46.0)
HEMOGLOBIN: 7 g/dL — AB (ref 12.0–15.0)
Lymphocytes Relative: 12 %
Lymphs Abs: 1.3 10*3/uL (ref 0.7–4.0)
MCH: 25.8 pg — ABNORMAL LOW (ref 26.0–34.0)
MCHC: 28.9 g/dL — AB (ref 30.0–36.0)
MCV: 89.3 fL (ref 78.0–100.0)
MONO ABS: 0.6 10*3/uL (ref 0.1–1.0)
MONOS PCT: 6 %
NEUTROS ABS: 8.6 10*3/uL — AB (ref 1.7–7.7)
NEUTROS PCT: 82 %
Platelets: 137 10*3/uL — ABNORMAL LOW (ref 150–400)
RBC: 2.71 MIL/uL — ABNORMAL LOW (ref 3.87–5.11)
RDW: 19.2 % — AB (ref 11.5–15.5)
WBC: 10.5 10*3/uL (ref 4.0–10.5)

## 2015-10-02 LAB — COMPREHENSIVE METABOLIC PANEL
ALBUMIN: 1.4 g/dL — AB (ref 3.5–5.0)
ALT: 7 U/L — ABNORMAL LOW (ref 14–54)
ANION GAP: 10 (ref 5–15)
AST: 11 U/L — AB (ref 15–41)
Alkaline Phosphatase: 60 U/L (ref 38–126)
BILIRUBIN TOTAL: 0.4 mg/dL (ref 0.3–1.2)
BUN: 75 mg/dL — ABNORMAL HIGH (ref 6–20)
CO2: 30 mmol/L (ref 22–32)
Calcium: 8.1 mg/dL — ABNORMAL LOW (ref 8.9–10.3)
Chloride: 118 mmol/L — ABNORMAL HIGH (ref 101–111)
Creatinine, Ser: 1.93 mg/dL — ABNORMAL HIGH (ref 0.44–1.00)
GFR calc Af Amer: 30 mL/min — ABNORMAL LOW (ref >60)
GFR, EST NON AFRICAN AMERICAN: 26 mL/min — AB (ref >60)
GLUCOSE: 111 mg/dL — AB (ref 65–99)
POTASSIUM: 3.7 mmol/L (ref 3.5–5.1)
Sodium: 158 mmol/L — ABNORMAL HIGH (ref 135–145)
Total Protein: 7 g/dL (ref 6.5–8.1)

## 2015-10-02 LAB — GLUCOSE, CAPILLARY
GLUCOSE-CAPILLARY: 105 mg/dL — AB (ref 65–99)
GLUCOSE-CAPILLARY: 110 mg/dL — AB (ref 65–99)
GLUCOSE-CAPILLARY: 111 mg/dL — AB (ref 65–99)
GLUCOSE-CAPILLARY: 99 mg/dL (ref 65–99)
Glucose-Capillary: 103 mg/dL — ABNORMAL HIGH (ref 65–99)
Glucose-Capillary: 113 mg/dL — ABNORMAL HIGH (ref 65–99)

## 2015-10-02 LAB — MAGNESIUM: Magnesium: 1.9 mg/dL (ref 1.7–2.4)

## 2015-10-02 MED ORDER — AMIODARONE HCL 200 MG PO TABS
200.0000 mg | ORAL_TABLET | Freq: Every day | ORAL | Status: DC
  Administered 2015-10-03 – 2015-10-10 (×8): 200 mg
  Filled 2015-10-02 (×8): qty 1

## 2015-10-02 MED ORDER — PANTOPRAZOLE SODIUM 40 MG PO PACK
40.0000 mg | PACK | Freq: Two times a day (BID) | ORAL | Status: DC
  Administered 2015-10-02 – 2015-10-10 (×16): 40 mg
  Filled 2015-10-02 (×16): qty 20

## 2015-10-02 MED ORDER — COLLAGENASE 250 UNIT/GM EX OINT
TOPICAL_OINTMENT | Freq: Every day | CUTANEOUS | Status: DC
  Administered 2015-10-02 – 2015-10-04 (×3): via TOPICAL
  Administered 2015-10-05: 1 via TOPICAL
  Administered 2015-10-07 – 2015-10-10 (×4): via TOPICAL
  Filled 2015-10-02 (×2): qty 60

## 2015-10-02 MED ORDER — COLLAGENASE 250 UNIT/GM EX OINT
TOPICAL_OINTMENT | Freq: Every day | CUTANEOUS | Status: DC
  Administered 2015-10-02 – 2015-10-04 (×3): via TOPICAL
  Administered 2015-10-05: 1 via TOPICAL
  Administered 2015-10-07 – 2015-10-10 (×4): via TOPICAL
  Filled 2015-10-02 (×6): qty 30

## 2015-10-02 MED ORDER — SODIUM CHLORIDE 0.9 % IV SOLN
510.0000 mg | Freq: Once | INTRAVENOUS | Status: AC
  Administered 2015-10-02: 510 mg via INTRAVENOUS
  Filled 2015-10-02: qty 17

## 2015-10-02 NOTE — Progress Notes (Signed)
Physical Therapy Wound Evaluation/Treatment Patient Details  Name: Jacqueline Fuentes MRN: [id number] Date of Birth: [birth year]  Today's Date: 10/02/2015 Time:  -     Subjective  Subjective: Pt nonverbal throughout session Patient and Family Stated Goals: Non stated Date of Onset:  (Pt admitted with sacral wound)  Pain Score:  Pt non-verbal throughout session however grimacing at times. Faces pain scale ~4/10 during those times.  Wound Assessment  Pressure Ulcer 09/21/15 Unstageable - Full thickness tissue loss in which the base of the ulcer is covered by slough (yellow, tan, gray, green or brown) and/or eschar (tan, brown or black) in the wound bed. (Active)  Dressing Type ABD;Gauze (Comment);Moist to dry 10/02/2015  9:00 AM  Dressing Clean;Dry;Intact 10/02/2015  9:00 AM  Dressing Change Frequency Daily 10/02/2015  9:00 AM  State of Healing Eschar 10/02/2015  9:00 AM  Site / Wound Assessment Bleeding;Black;Brown;Pink;Red;Yellow 10/02/2015  9:00 AM  % Wound base Red or Granulating 40% 10/02/2015  9:00 AM  % Wound base Yellow 40% 10/02/2015  9:00 AM  % Wound base Black 20% 10/02/2015  9:00 AM  % Wound base Other (Comment) 100% 09/28/2015  5:35 PM  Peri-wound Assessment Intact;Induration 10/02/2015  9:00 AM  Wound Length (cm) 18 cm 10/02/2015  9:00 AM  Wound Width (cm) 17 cm 10/02/2015  9:00 AM  Wound Depth (cm) 4.1 cm 10/02/2015  9:00 AM  Tunneling (cm) 5.5 09/21/2015  5:00 AM  Undermining (cm) 4.3cm 7 o'clock to 10 o'clock; 3.5cm 11 o'clock to 3 o'clock, 3.1cm 3 o'clock to 6 o'clock 10/02/2015  9:00 AM  Margins Unattached edges (unapproximated) 10/02/2015  9:00 AM  Drainage Amount Moderate 10/02/2015  9:00 AM  Drainage Description Serosanguineous 10/02/2015  9:00 AM  Treatment Debridement (Selective);Hydrotherapy (Pulse lavage);Packing (Saline gauze) 10/02/2015  9:00 AM  Santyl applied to wound bed prior to applying dressing.   Hydrotherapy Pulsed lavage therapy - wound location: Sacrum Pulsed Lavage  with Suction (psi):  (8-12) Pulsed Lavage with Suction - Normal Saline Used: 1000 mL (Could benefit from 2000 mL next session) Pulsed Lavage Tip: Tip with splash shield Selective Debridement Selective Debridement - Location: Sacrum Selective Debridement - Tools Used: Forceps;Scalpel;Scissors Selective Debridement - Tissue Removed: Yellow and black/brown necrotic tissue   Wound Assessment and Plan  Wound Therapy - Assess/Plan/Recommendations Wound Therapy - Clinical Statement: Pt presents to hydrotherapy with a large sacral wound. Pt will benefit from continued hydrotherapy to promote healing of the wound bed and decrease bioburden.  Wound Therapy - Functional Problem List: Decreased tolerance of OOB and sitting position.  Factors Delaying/Impairing Wound Healing: Multiple medical problems;Immobility Hydrotherapy Plan: Debridement;Dressing change;Patient/family education;Pulsatile lavage with suction Wound Therapy - Frequency: 6X / week Wound Therapy - Follow Up Recommendations: Other (comment) (Per chart review plan is for return to [facility]) Wound Plan: See above  Wound Therapy Goals- Improve the function of patient's integumentary system by progressing the wound(s) through the phases of wound healing (inflammation - proliferation - remodeling) by: Decrease Necrotic Tissue to: 25% Decrease Necrotic Tissue - Progress: Goal set today Increase Granulation Tissue to: 75% Increase Granulation Tissue - Progress: Goal set today Goals/treatment plan/discharge plan were made with and agreed upon by patient/family: No, Patient unable to participate in goals/treatment/discharge plan and family unavailable Time For Goal Achievement: 7 days Wound Therapy - Potential for Goals: Fair  Goals will be updated until maximal potential achieved or discharge criteria met.  Discharge criteria: when goals achieved, discharge from hospital, MD decision/surgical intervention, no progress towards goals,  refusal/missing three consecutive treatments without notification or medical reason.  GP     Rolinda Roan 10/02/2015, 11:16 AM   Rolinda Roan, PT, DPT Acute Rehabilitation Services Pager: [phone]

## 2015-10-02 NOTE — Progress Notes (Signed)
[place] TEAM 1 - Stepdown/ICU TEAM PROGRESS NOTE  Jacqueline Fuentes SRP:[id number] DOB: 02-Feb-1951 DOA: 09/20/2015 PCP: Verneita Griffes, MD  Admit HPI / Brief Narrative: 65 year old F from [facility] w/ a PMHxQuadriplegia, Trach/Vent/PEG dependent, Seizure disorder, multiple CVAs, Atrial Fibrillation, Chronic Diastolic CHF CKD, Sacral Decubitus, Rt sided Pleural Effusion with Pleur-X in place, and Lt Hydronephrosis with Chronic Nephrostomy tube who was recently treated for funguria with diflucan, pseudomonas PNA with ertapenem, and sacral wound positive for MRSA. She was also started on cefepime and vancomycin, for what was reportedly a MRSA PNA. She had been requiring frequent blood transfusions for what was thought to be progression of her chronic anemia, however, she has multiple antibodies in her blood and is difficult to match. [facility] listed her as futility of care due to this anemia, however husband had reportedly been very reluctant to address goals of care.   On 3/15 Jacqueline Fuentes developed hypotension and hemoglobin was noted to be 4.2. She was transfused 1 unit PRBC, which was all that was available to [facility] at the time. Husband was contacted and requested a transfer to the emergency department so that she could receive further transfusions. In the emergency department she was noted to be febrile with leukocytosis. Hemoglobin was confirmed to be low at 5. Transfusions were ordered by ED staff.  HPI/Subjective: The patient is unresponsive and noncommunicative and there is no family present.  Assessment/Plan:  Chronic respiratory failure with ventilator dependence -vent management per PCCM  Recurrent Right effusion with PleurX -management per PCCM  Essential hypertension -Blood pressure currently well controlled  Atrial Fibrillation -Amiodarone + metoprolol - heart rate controlled - not a candidate for anticoagulation due to recurring severe anemia  Chronic Diastolic  CHF -Echocardiogram confirms preserved systolic fxn and findings c/w only mild/modest diastolic dysfxn - no evidence of severe volume overload at present  Acute on CKD (baseline Cr 0.97 06/30/15)  -Lt hydronephrosis with nephrostomy tube (chronic) -Chronic indwelling foley catheter -Not a dialysis candidate -Creatinine slowly improving  Acute blood loss + anemia of chronic illness -Suspected low-grade ongoing loss related to hematuria+/- GIB -no further interventions available - anemia treated w/ blood transfusions x4 units this admit   Severe protein calorie malnutrition  -Continue tube feeds  Sepsis unspecified organism, source unclear consider -Multifactorial PNA, UTI, Sacral wound, or any number of chronic indwelling lines/tubes -Course antibiotics completed and now off antibiotics -Afebrile with normal white blood cell count at present  Seizure disorder -Continue usual medical therapy  Anxiety/depression -Unable to assess as patient is obtunded   Multiple prior CVAs  Hypomagnesemia -Corrected  Hypernatremia -Continue free water and follow  Osteomyelitis/Sacral Decubitus Ulcer unstageable -Care as directed by WOC  Chronic pain  Code Status: FULL Family Communication: no family present at time of exam Disposition Plan: SDU - appears stable for return to [facility]   Consultants: PCCM Palliative Care   Procedures: none  Antibiotics: Meropenem 3/16 > 3/22 Vancomycin 3/16 > 3/22  DVT prophylaxis: SCDs  Objective: Blood pressure 112/69, pulse 65, temperature 97.8 F (36.6 C), temperature source Axillary, resp. rate 0, height _0  (1.727 m), weight 75.297 kg (166 lb), SpO2 100 %.  Intake/Output Summary (Last 24 hours) at 10/02/15 1540 Last data filed at 10/02/15 1000  Gross per 24 hour  Intake   2933 ml  Output   1100 ml  Net   1833 ml   Exam: General: No acute respiratory distress evident Lungs: Poor air movement bilateral bases - no  wheeze Cardiovascular: Regular rate without murmur appreciable Abdomen: Nondistended, soft, bowel sounds positive, no rebound, no ascites, no appreciable mass Extremities: No significant cyanosis, or clubbing;  trace edema bilateral lower extremities  Data Reviewed:  Basic Metabolic Panel:  Recent Labs Lab 09/26/15 0600 09/28/15 0001 09/28/15 0540 09/29/15 0415 09/30/15 0609 09/30/15 0905 10/01/15 1320 10/02/15 0500  NA 153* 155* 156* 157* 159* 159* 158* 158*  K 3.6 3.5 3.5 3.4* 5.3* 4.1 4.1 3.7  CL 109 113* 111 116* 119* 119* 119* 118*  CO2 _0 33* _1 GLUCOSE 140* 144* 181* 117* 119* 120* 110* 111*  BUN 166* 131* 127* 114* 108* 100* 84* 75*  CREATININE 3.53* 2.90* 2.71* 2.55* 2.43* 2.19* 2.10* 1.93*  CALCIUM 8.4* 8.4* 8.3* 8.4* 8.6* 8.4* 8.4* 8.1*  MG 2.2 2.0 1.9  --   --  1.8 2.0 1.9  PHOS 3.5  --  3.8  --   --   --   --   --     CBC:  Recent Labs Lab 09/30/15 0609 09/30/15 0905 10/01/15 0325 10/01/15 1320 10/02/15 0500  WBC 13.3* 10.6* 11.2* 12.5* 10.5  NEUTROABS  --  8.8* 9.2* 10.2* 8.6*  HGB 8.0* 7.7* 7.4* 7.2* 7.0*  HCT 25.8* 25.8* 25.1* 23.7* 24.2*  MCV 88.7 88.4 88.4 88.1 89.3  PLT 153 143* 142* 137* 137*    Liver Function Tests:  Recent Labs Lab 09/30/15 0905 10/01/15 1320 10/02/15 0500  AST 11* 12* 11*  ALT 8* 8* 7*  ALKPHOS 66 60 60  BILITOT 0.6 0.8 0.4  PROT 7.0 7.2 7.0  ALBUMIN 1.4* 1.4* 1.4*   Cardiac Enzymes:  Recent Labs Lab 09/28/15 0001 09/28/15 0540 09/28/15 1330  CKTOTAL 15*  --   --   CKMB 1.5  --   --   TROPONINI 0.05* 0.06* 0.03    CBG:  Recent Labs Lab 10/01/15 2022 10/02/15 0038 10/02/15 0404 10/02/15 0805 10/02/15 1217  GLUCAP 98 111* 105* 103* 99    Recent Results (from the past 240 hour(s))  Culture, respiratory (NON-Expectorated)     Status: None (Preliminary result)   Collection Time: 09/30/15 12:19 PM  Result Value Ref Range Status   Specimen Description TRACHEAL ASPIRATE  Final    Special Requests NONE  Final   Gram Stain   Final    FEW WBC PRESENT,BOTH PMN AND MONONUCLEAR RARE SQUAMOUS EPITHELIAL CELLS PRESENT ABUNDANT GRAM NEGATIVE RODS Performed at [facility]    Culture   Final    ABUNDANT GRAM NEGATIVE RODS Performed at [facility]    Report Status PENDING  Incomplete     Studies:   Recent x-ray studies have been reviewed in detail by the Attending Physician  Scheduled Meds:  Scheduled Meds: . sodium chloride   Intravenous Once  . amiodarone  200 mg Oral Daily  . amLODipine  10 mg Per Tube Daily  . antiseptic oral rinse  7 mL Mouth Rinse QID  . chlorhexidine gluconate (SAGE KIT)  15 mL Mouth Rinse BID  . collagenase   Topical Daily  . collagenase   Topical Daily  . darbepoetin (ARANESP) injection - NON-DIALYSIS  60 mcg Subcutaneous Q Thu-1800  . free water  400 mL Per Tube Q4H  . insulin aspart  0-9 Units Subcutaneous TID WC  . metoprolol tartrate  12.5 mg Per Tube BID  . pantoprazole (PROTONIX) IV  40 mg Intravenous Q12H  . sodium chloride flush  3 mL  Intravenous Q12H  . valproic acid  125 mg Per Tube BID    Time spent on care of this patient: 25 mins   [facility] T , MD   [facility] Office  [phone] Pager - Text Page per Shea Evans as per below:  On-Call/Text Page:      [email or web address]      password [email or web address]  If 7PM-7AM, please contact night-coverage [email or web address] Password [email or web address] 10/02/2015, 3:40 PM   LOS: 11 days

## 2015-10-02 NOTE — Consult Note (Addendum)
WOC wound re-consult note Reason for Consult: Consult requested for right leg and heel.  Previous consult was performed on 3/16, refer to progress notes.  Sacrum wound is necrotic and hydrotherapy was ordered by the primary team today.  It will not be appropriate for Vac therapy as was suggested in a physician progress note, until there is less than 20% of nonviable tissue in the wound, according to [facility] literature. Sacral wound was not assessed today by WOC; re-consult was requested for right heel and right leg. Refer to physical therapy notes for assessment and percentages of the sacrum.  They will perform hydrotherapy Q Mon-Sat and continue Santyl for chemical debridement as previously ordered. Pt has multiple systemic factors which can impair wound healing and is critically ill.  Wound type: Unstageable pressure injury to right inner heel 2X2cm, 100% dry eschar without odor, drainage, or fluctuance. Inner ankle with previous partial thickness wound which has evolved into unstageable, 100% dry intact eschar; .2X.2X.1cm, pink and moist, no odor or drainage. Right upper posterior leg with full thickness wound is healing, pink moist wound bed 1X1X.1cm, no odor, scant amt yellow drainage. Right posterior calf with unstageable pressure injury; 12X3cm, previously 100% dry eschar, has now evolved into 90% loose slough with fluctuance and mod amt tan drainage, slight odor.  Pressure Ulcer POA: Yes Dressing procedure/placement/frequency:  Begin Santyl ointment for enzymatic debridement to right leg. Air mattress in place to reduce pressure to   sacrum/buttocks. It is best practice to leave dry stable eschar intact to right heel and ankle, continue to protect from further injury and reduce pressure to BLE with protective boots. No family present to discuss plan of care.  Please re-consult if further assistance is needed. Thank-you,  Julien Girt MSN, [place], [place], [place], [place]

## 2015-10-02 NOTE — Progress Notes (Addendum)
PULMONARY / CRITICAL CARE MEDICINE   Name: Jacqueline Fuentes MRN: [id number] DOB: 07/11/1950    ADMISSION DATE:  09/20/2015 CONSULTATION DATE:  09/20/2015  REFERRING MD:  EDP  CHIEF COMPLAINT:  Anemia, shock  REVIEW OF SYSTEMS:   Unable  HPI: 65 year old unfortunate female. Vent/trach dependent from [facility]. Presented 3/15 with anemia and hypotension. Unable to transfuse effectively at [facility] due to multiple antibodies in blood. Could be just chronic anemia, but concern for GIB or hematuria.  SUBJECTIVE:   No events overnight.  Unresponsive.  VITAL SIGNS: BP 112/69 mmHg  Pulse 65  Temp(Src) 97.8 F (36.6 C) (Axillary)  Resp 0  Ht 5\' 8"  (1.727 m)  Wt 166 lb (75.297 kg)  BMI 25.25 kg/m2  SpO2 100%  HEMODYNAMICS:    VENTILATOR SETTINGS: Vent Mode:  [-] PCV FiO2 (%):  [40 %] 40 % Set Rate:  [18 bmp] 18 bmp PEEP:  [5 cmH20] 5 cmH20 Plateau Pressure:  [25 [id number] cmH20] 28 cmH20  INTAKE / OUTPUT: I/O last 3 completed shifts: In: 4 [I.V.:326; NG/GT:5400] Out: 3300 [Urine:3300]  PHYSICAL EXAMINATION: General:  Obese, chronically ill appearing female on  Vent via trach Neuro:  Does not follow commands, facial grimace only to painful stimuli. Contracted  HEENT:  Atraumatic, normocephalic, no discharge. 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   LABS:  BMET  Recent Labs Lab 09/30/15 0905 10/01/15 1320 10/02/15 0500  NA 159* 158* 158*  K 4.1 4.1 3.7  CL 119* 119* 118*  CO2 29 29 30   BUN 100* 84* 75*  CREATININE 2.19* 2.10* 1.93*  GLUCOSE 120* 110* 111*   Electrolytes  Recent Labs Lab 09/26/15 0600  09/28/15 0540  09/30/15 0905 10/01/15 1320 10/02/15 0500  CALCIUM 8.4*  < > 8.3*  < > 8.4* 8.4*  8.1*  MG 2.2  < > 1.9  --  1.8 2.0 1.9  PHOS 3.5  --  3.8  --   --   --   --   < > = values in this interval not displayed. CBC  Recent Labs Lab 10/01/15 0325 10/01/15 1320 10/02/15 0500  WBC 11.2* 12.5* 10.5  HGB 7.4* 7.2* 7.0*  HCT 25.1* 23.7* 24.2*  PLT 142* 137* 137*   Coag's No results for input(s): APTT, INR in the last 168 hours. Sepsis Markers  Recent Labs Lab 09/30/15 0905  LATICACIDVEN 0.7   ABG No results for input(s): PHART, PCO2ART, PO2ART in the last 168 hours. Liver Enzymes  Recent Labs Lab 09/30/15 0905 10/01/15 1320 10/02/15 0500  AST 11* 12* 11*  ALT 8* 8* 7*  ALKPHOS 66 60 60  BILITOT 0.6 0.8 0.4  ALBUMIN 1.4* 1.4* 1.4*   Cardiac Enzymes  Recent Labs Lab 09/28/15 0001 09/28/15 0540 09/28/15 1330  TROPONINI 0.05* 0.06* 0.03   Glucose  Recent Labs Lab 10/01/15 1222 10/01/15 1653 10/01/15 2022 10/02/15 0038 10/02/15 0404 10/02/15 0805  GLUCAP 124* 117* 98 111* 105* 103*   Imaging   STUDIES:  none  CULTURES: BCx2 3/16 > (-) Urine 3/16 >neg Tracheal aspirate 3/16 > G(-) rods   ANTIBIOTICS: Meropenem 3/16 >3/22 Vancomycin 3/16 >3/22  SIGNIFICANT EVENTS: 3/16 transfer from [facility] to [facility] for anemia/hypotension  LINES/TUBES: Trach #7 cuffed >>  RUE PICC 2/3 >>> Rt chest tube>>  ASSESSMENT / PLAN:  PULMONARY A: Chronic respiratory failure with ventilator dependence Chronic R effusion with PleurX Wide mediastinum  P:   Full vent support Vent bundle Duoneb PRN Follow up CXR Progress catheter to open draining ? CT chest but little to offer for interventions, therefore not ordered.  CARDIOVASCULAR A:  Hypotension secondary to ABLA Atrial Fibrillation Acute on chronic diastolic CHF P:  Telemetry monitoring Transfuse PRBC for hemoglobin less than 7 Start norvasc 5 mg PO daily for hypertension with holding parameters.  RENAL Lab Results  Component Value Date   CREATININE 1.93* 10/02/2015   CREATININE  2.10* 10/01/2015   CREATININE 2.19* 09/30/2015    Recent Labs Lab 09/30/15 0905 10/01/15 1320 10/02/15 0500  K 4.1 4.1 3.7    Recent Labs Lab 09/30/15 0905 10/01/15 1320 10/02/15 0500  NA 159* 158* 158*   A:   Acute on CKD - appears prerenal, improving some over the last 48 hours L hydronephrosis with nephrostomy tube Chronic indwelling foley catheter Hypokalemia Hypocalcemia P:   Follow BMP  Saline lock Cont  free water 400 ml q4 hours. Not a dialysis candidate Replace electrolytes as indicated.  GASTROINTESTINAL A:   GERD Possible GIB Severe protein calorie malnutrition  P:   Continue Protonix TF's running  HEMATOLOGIC  Recent Labs  10/01/15 1320 10/02/15 0500  HGB 7.2* 7.0*  A:   Acute blood loss on anemia of chronic illness. Possibly from GIB, hematuria P:  Serial CBC ? Return to [facility] soon. No real interventions available. Anemia treated, will communicate with case management.  INFECTIOUS A:   Possible sepsis, source unclear consider PNA, UTI, Sacral wound, or any number of chronic indwelling lines/tubes. P:   D/C abx after dose, course complete. Trend WBC and fever curves Follow Blood, urine and sputum culture Wound care consult to evaluate pressure ulcer  ENDOCRINE CBG (last 3)   Recent Labs  10/02/15 0038 10/02/15 0404 10/02/15 0805  GLUCAP 111* 105* 103*   A: No acute issues P:   Trend Glucose  NEUROLOGIC A:   Seizure disorder Anxiety/depression Chronic pain H/o several CVA P:   RASS goal: 0 PRN fentanyl Continue valproic acid,  Continue to hold lexapro, trazodone   Anticipate pt to be be transferred back to [facility] soon. Spoke to RN >> pt at baseline from vent POV. To be transferred to [facility] in am.   Monica Becton, MD 10/02/2015, 12:55 PM [facility] Pager [phone] After 3 pm or if no answer, call [phone]

## 2015-10-02 NOTE — Care Management Important Message (Signed)
Important Message  Patient Details  Name: Jacqueline Fuentes MRN: [id number] Date of Birth: [birth year]   Medicare Important Message Given:  Yes    Loann Quill 10/02/2015, 12:36 PM

## 2015-10-03 DIAGNOSIS — N179 Acute kidney failure, unspecified: Secondary | ICD-10-CM | POA: Diagnosis not present

## 2015-10-03 DIAGNOSIS — A419 Sepsis, unspecified organism: Secondary | ICD-10-CM | POA: Diagnosis not present

## 2015-10-03 DIAGNOSIS — J189 Pneumonia, unspecified organism: Secondary | ICD-10-CM | POA: Diagnosis not present

## 2015-10-03 DIAGNOSIS — D62 Acute posthemorrhagic anemia: Secondary | ICD-10-CM | POA: Diagnosis not present

## 2015-10-03 DIAGNOSIS — M4626 Osteomyelitis of vertebra, lumbar region: Secondary | ICD-10-CM | POA: Diagnosis present

## 2015-10-03 DIAGNOSIS — D649 Anemia, unspecified: Secondary | ICD-10-CM | POA: Diagnosis not present

## 2015-10-03 LAB — GLUCOSE, CAPILLARY
GLUCOSE-CAPILLARY: 118 mg/dL — AB (ref 65–99)
GLUCOSE-CAPILLARY: 99 mg/dL (ref 65–99)
Glucose-Capillary: 106 mg/dL — ABNORMAL HIGH (ref 65–99)
Glucose-Capillary: 100 mg/dL — ABNORMAL HIGH (ref 65–99)
Glucose-Capillary: 96 mg/dL (ref 65–99)
Glucose-Capillary: 91 mg/dL (ref 65–99)

## 2015-10-03 LAB — BASIC METABOLIC PANEL
Anion gap: 6 (ref 5–15)
BUN: 72 mg/dL — AB (ref 6–20)
CHLORIDE: 117 mmol/L — AB (ref 101–111)
CO2: 31 mmol/L (ref 22–32)
Calcium: 8.1 mg/dL — ABNORMAL LOW (ref 8.9–10.3)
Creatinine, Ser: 1.95 mg/dL — ABNORMAL HIGH (ref 0.44–1.00)
GFR calc Af Amer: 30 mL/min — ABNORMAL LOW (ref >60)
GFR, EST NON AFRICAN AMERICAN: 26 mL/min — AB (ref >60)
Glucose, Bld: 116 mg/dL — ABNORMAL HIGH (ref 65–99)
POTASSIUM: 3.4 mmol/L — AB (ref 3.5–5.1)
Sodium: 154 mmol/L — ABNORMAL HIGH (ref 135–145)

## 2015-10-03 LAB — MAGNESIUM: Magnesium: 1.7 mg/dL (ref 1.7–2.4)

## 2015-10-03 LAB — CBC
HEMATOCRIT: 22.6 % — AB (ref 36.0–46.0)
HEMOGLOBIN: 7 g/dL — AB (ref 12.0–15.0)
MCH: 27.1 pg (ref 26.0–34.0)
MCHC: 30.5 g/dL (ref 30.0–36.0)
MCV: 88.6 fL (ref 78.0–100.0)
Platelets: 127 10*3/uL — ABNORMAL LOW (ref 150–400)
RBC: 2.55 MIL/uL — ABNORMAL LOW (ref 3.87–5.11)
RDW: 19 % — AB (ref 11.5–15.5)
WBC: 9.9 10*3/uL (ref 4.0–10.5)

## 2015-10-03 LAB — CULTURE, RESPIRATORY

## 2015-10-03 LAB — PREPARE RBC (CROSSMATCH)

## 2015-10-03 LAB — CULTURE, RESPIRATORY W GRAM STAIN

## 2015-10-03 MED ORDER — POTASSIUM CHLORIDE 10 MEQ/100ML IV SOLN
10.0000 meq | INTRAVENOUS | Status: AC
  Administered 2015-10-03 (×4): 10 meq via INTRAVENOUS
  Filled 2015-10-03: qty 100

## 2015-10-03 MED ORDER — SODIUM CHLORIDE 0.9 % IV SOLN: Freq: Once | INTRAVENOUS | Status: DC

## 2015-10-03 MED ORDER — MAGNESIUM SULFATE 2 GM/50ML IV SOLN
2.0000 g | Freq: Once | INTRAVENOUS | Status: AC
  Administered 2015-10-03: 2 g via INTRAVENOUS
  Filled 2015-10-03: qty 50

## 2015-10-03 MED ORDER — FREE WATER
500.0000 mL | Status: DC
  Administered 2015-10-03 – 2015-10-06 (×17): 500 mL

## 2015-10-03 MED ORDER — VITAL AF 1.2 CAL PO LIQD
1000.0000 mL | ORAL | Status: DC
  Administered 2015-10-04 – 2015-10-09 (×6): 1000 mL
  Filled 2015-10-03 (×11): qty 1000

## 2015-10-03 MED ORDER — POTASSIUM CHLORIDE 10 MEQ/100ML IV SOLN
10.0000 meq | Freq: Once | INTRAVENOUS | Status: AC
  Administered 2015-10-03: 10 meq via INTRAVENOUS

## 2015-10-03 NOTE — Clinical Documentation Improvement (Signed)
Internal Medicine  Can the diagnosis of CKD be further specified? I see baseline Cr is 0.97 but I need stage of CKD. Please document findings in next progress note.   CKD Stage I - GFR greater than or equal to 90  CKD Stage II - GFR 60-89  CKD Stage III - GFR 30-59  CKD Stage IV - GFR 15-29  CKD Stage V - GFR < 15  ESRD (End Stage Renal Disease)  Other condition  Unable to clinically determine  Supporting Information: : (risk factors, signs and symptoms, diagnostics, treatment)  Black female  GFR's for current admission are running from 12 to 30  Please exercise your independent, professional judgment when responding. A specific answer is not anticipated or expected.  Thank You, Zoila Shutter RN, BSN, [place] [phone]; Cell: [phone]

## 2015-10-03 NOTE — Progress Notes (Signed)
Physical Therapy Wound Treatment Patient Details  Name: Jacqueline Fuentes MRN: [id number] Date of Birth: 1951-02-15  Today's Date: 10/03/2015 Time: 0920-1025 Time Calculation (min): 65 min  Subjective  Subjective: Pt nonverbal throughout session Patient and Family Stated Goals: Non stated Date of Onset:  (Pt admitted with sacral wound)  Pain Score: Grimacing at end of session with packing. RN to provide pain meds.   Wound Assessment  Pressure Ulcer 09/21/15 Unstageable - Full thickness tissue loss in which the base of the ulcer is covered by slough (yellow, tan, gray, green or brown) and/or eschar (tan, brown or black) in the wound bed. (Active)  Dressing Type ABD;Gauze (Comment);Moist to dry 10/03/2015 12:40 PM  Dressing Clean;Dry;Intact 10/03/2015 12:40 PM  Dressing Change Frequency Daily 10/03/2015 12:40 PM  State of Healing Eschar 10/03/2015 12:40 PM  Site / Wound Assessment Bleeding;Black;Brown;Pink;Red;Yellow 10/03/2015 12:40 PM  % Wound base Red or Granulating 50% 10/03/2015 12:40 PM  % Wound base Yellow 40% 10/03/2015 12:40 PM  % Wound base Black 10% 10/03/2015 12:40 PM  % Wound base Other (Comment) 100% 09/28/2015  5:35 PM  Peri-wound Assessment Intact;Induration 10/03/2015 12:40 PM  Wound Length (cm) 18 cm 10/02/2015  9:00 AM  Wound Width (cm) 17 cm 10/02/2015  9:00 AM  Wound Depth (cm) 4.1 cm 10/02/2015  9:00 AM  Tunneling (cm) 5.5 09/21/2015  5:00 AM  Undermining (cm) 4.3cm 7 o'clock to 10 o'clock; 3.5cm 11 o'clock to 3 o'clock, 3.1cm 3 o'clock to 6 o'clock 10/02/2015  9:00 AM  Margins Unattached edges (unapproximated) 10/03/2015 12:40 PM  Drainage Amount Moderate 10/03/2015 12:40 PM  Drainage Description Serosanguineous 10/03/2015 12:40 PM  Treatment Debridement (Selective);Hydrotherapy (Pulse lavage);Packing (Saline gauze) 10/03/2015 12:40 PM  Santyl applied to wound bed prior to applying dressing.    Hydrotherapy Pulsed lavage therapy - wound location: Sacrum Pulsed Lavage with  Suction (psi):  (8-12) Pulsed Lavage with Suction - Normal Saline Used: 2000 mL Pulsed Lavage Tip: Tip with splash shield Selective Debridement Selective Debridement - Location: Sacrum Selective Debridement - Tools Used: Forceps;Scalpel;Scissors Selective Debridement - Tissue Removed: Yellow and black/brown necrotic tissue   Wound Assessment and Plan  Wound Therapy - Assess/Plan/Recommendations Wound Therapy - Clinical Statement: Progressing with selective removal of non-viable tissue. Wound Therapy - Functional Problem List: Decreased tolerance of OOB and sitting position.  Factors Delaying/Impairing Wound Healing: Multiple medical problems;Immobility Hydrotherapy Plan: Debridement;Dressing change;Patient/family education;Pulsatile lavage with suction Wound Therapy - Frequency: 6X / week Wound Therapy - Follow Up Recommendations: Other (comment) (Per chart review plan is for return to [facility]) Wound Plan: See above  Wound Therapy Goals- Improve the function of patient's integumentary system by progressing the wound(s) through the phases of wound healing (inflammation - proliferation - remodeling) by: Decrease Necrotic Tissue to: 25% Decrease Necrotic Tissue - Progress: Progressing toward goal Increase Granulation Tissue to: 75% Increase Granulation Tissue - Progress: Progressing toward goal Goals/treatment plan/discharge plan were made with and agreed upon by patient/family: No, Patient unable to participate in goals/treatment/discharge plan and family unavailable Time For Goal Achievement: 7 days Wound Therapy - Potential for Goals: Fair  Goals will be updated until maximal potential achieved or discharge criteria met.  Discharge criteria: when goals achieved, discharge from hospital, MD decision/surgical intervention, no progress towards goals, refusal/missing three consecutive treatments without notification or medical reason.  GP     Rolinda Roan 10/03/2015, 12:46 PM   Rolinda Roan, PT, DPT Acute Rehabilitation Services Pager: [phone]

## 2015-10-03 NOTE — Progress Notes (Signed)
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 Seizure disorder, multiple CVAs, Atrial Fibrillation, Chronic Diastolic CHF CKD, Sacral Decubitus, Rt sided Pleural Effusion with Pleur-X in place, and Lt Hydronephrosis with Chronic Nephrostomy tube.   She has also recently been treated for funguria with diflucan, pseudomonas PNA with ertapenem, and sacral wound has tested positive for MRSA. She was recently started on cefepime and vancomycin, for what is reportedly a MRSA PNA. She has also been requiring frequent blood transfusions for what is thought to be progression of her chronic anemia, however, she has multiple antibodies in her blood and is difficult to find transfusions for. [facility] has listed her as futility of care due to this anemia, however, husband has reportedly been very reluctant to address goals of care.   3/15 Jacqueline Fuentes developed hypotension and hemoglobin was noted to be 4.2. She was transfused 1 unit PRBC, which was all that was available to [facility] at the time. Husband was contacted and requested a transfer to the emergency department so that she could receive further transfusions. In the emergency department she was noted to be febrile with leukocytosis. Hemoglobin was confirmed to be low at 5. Transfusions were ordered by ED staff, however, blood bank is unsure they will be able to accommodate this request due to her cross matching difficulties. Broad spectrum antibiotics were resumed.  HPI/Subjective: 3/28 afebrile overnight, A/O 0, grimaces to painful stimuli, follows no commands,  Assessment/Plan: Chronic respiratory failure with ventilator dependence -Per [facility] M -Continue below settings on vent.  Recurrent Right effusion with PleurX Per PCCM  Essential  hypertension -Amlodipine 10 mg daily -Occult blood card negative  Atrial Fibrillation -Amiodarone 200 mg daily -Metoprolol 12.5 mg/tube BID  Acute on chronic Diastolic CHF -Transfuse for hemoglobin< 7 (secondary to patient needing difficult blood to obtain, cross matching difficulties) -3/28 transfused 1 unit PRBC  Acute on CKD (baseline Cr= 0.97 obtained 06/30/15)  -Lt  hydronephrosis with nephrostomy tube (chronic) -Chronic indwelling foley catheter -Not a dialysis candidate  Acute blood loss anemia/Hematuria -Possible GIB. Code blood card pending -Urinalysis positive hematuria, may be unavoidable with permanent indwelling cath.. -Protonix IV BID -No real interventions available.   Severe protein calorie malnutrition  -TF's running  Sepsis unspecified organism, source unclear consider . -Multifactorial PNA, UTI, Sacral wound, or any number of chronic indwelling lines/tubes -Course antibiotics complete D/C abx. -Trend WBC and fever curves -Follow Blood, urine and sputum culture  Seizure disorder -Valproic acid, 125 mg BID  Anxiety/depression Continue to hold lexapro, trazodone    H/o several CVA  Hypokalemia -Potassium goal> 4 -Potassium IV 50 mEq  Hypomagnesemia -Magnesium goal> 2 -Magnesium IV 2 gm  Hypernatremia -Increase Free water 557m q 4 hr  Osteomyelitis/Sacral Decubitus Ulcer unstageable -L/S spine x-ray; no definite evidence by x-ray however by definition exposed bone patient has osteomyelitis. No further workup warranted. -PT for Hydrotherapy daily -wound VAC post Hydrotherapy, CSW Jacqueline Fuentes working with [facility] to see if they can accommodate this treatment. -Discharge wants final arrangements are completed  Chronic pain       Goals of care -Consult to palliative care; per admission note [facility] has labeled any further aggressive care as futileMultisystem organ failure, quadriplegia, discuss change of code, short-term vs long-term goals  of care. Hospice?   Code Status: FULL Family Communication:  no family present at time of exam Disposition Plan: [facility] on Tuesday    Consultants: [place] Dr.Wesam Kathryne Sharper PCCM   Procedure/Significant Events: 3/15 transfused 1 unit PRBC, at [facility] 3/16 transfused 2 units PRBC 3/17 transfused 1 unit PRBC 3/21 transfused 1unit PRBC 3/26 echocardiogram;- LVEF=60%- 65%. - Left atrium:  mildly dilated. - Pericardium, extracardiac: Left pleural effusion. 3/26 x-ray L-spine;No definite sacral osteomyelitis 3/28 transfused 1 unit PRBC   Culture 3/15 blood right hand/forearm negative final Urine 3/16 >neg 3/25 trach aspirate pending    Antibiotics: Meropenem 3/16 >3/22 Vancomycin 3/16 >3/22  DVT prophylaxis: SCD   Devices   LINES / TUBES:  Trach #7 cuffed >> RUE PICC 2/3 >>> Rt chest tube>>    Continuous Infusions: . feeding supplement (VITAL AF 1.2 CAL) 1,000 mL (10/03/15 1513)    Objective: VITAL SIGNS: Temp: 98.4 F (36.9 C) (03/28 1830) Temp Source: Axillary (03/28 1830) BP: 153/89 mmHg (03/28 1830) Pulse Rate: 75 (03/28 1838) Vent mode; PCV SPO2; 100% FIO2: 40% PC; 25 PEEP; 5 RR spontaneous; 7 RR total; 25     Intake/Output Summary (Last 24 hours) at 10/03/15 1919 Last data filed at 10/03/15 1800  Gross per 24 hour  Intake 3404.08 ml  Output   1725 ml  Net 1679.08 ml     Exam: General: A/O 0, grimaces to painful stimuli, follows no commands, No acute respiratory distress Eyes:  negative scleral hemorrhage ENT: Negative Runny nose, negative gingival bleeding, Neck:  Negative scars, masses, torticollis, lymphadenopathy, JVD Lungs: coarse breath sounds bilateral (on ventilator), without wheezes or crackles Cardiovascular: Tachycardic, Regular rhythm without murmur gallop or rub normal S1 and S2 Abdomen: Morbidly obese, negative abdominal pain, nondistended, positive soft, bowel sounds, no rebound, no ascites,  no appreciable mass, PEG tube in place negative sign of infection. Left nephrostomy tube in place draining minimal Tea colored fluid. GU; hematuria present; LARGE SACRAL DECUBITUS ULCER, 9 INCHES IN DIAMETER, tracking cephalad 6 cm, large amount of serosanguineous fluid, purulent discharge. Smaller ulcer posterior right thigh stage I Extremities: all extremities mildly to moderately contracted does not withdraw to painful stimuli  Psychiatric: Unable to assess  Neurologic:  No spontaneous movement of extremities, no withdrawal to painful stimuli of extremities.     Data Reviewed: Basic Metabolic Panel:  Recent Labs Lab 09/28/15 0540  09/30/15 0609 09/30/15 0905 10/01/15 1320 10/02/15 0500 10/03/15 0500  NA 156*  < > 159* 159* 158* 158* 154*  K 3.5  < > 5.3* 4.1 4.1 3.7 3.4*  CL 111  < > 119* 119* 119* 118* 117*  CO2 30  < > _0 GLUCOSE 181*  < > 119* 120* 110* 111* 116*  BUN 127*  < > 108* 100* 84* 75* 72*  CREATININE 2.71*  < > 2.43* 2.19* 2.10* 1.93* 1.95*  CALCIUM 8.3*  < > 8.6* 8.4* 8.4* 8.1* 8.1*  MG 1.9  --   --  1.8 2.0 1.9 1.7  PHOS 3.8  --   --   --   --   --   --   < > = values in this interval not displayed. Liver Function Tests:  Recent Labs Lab 09/30/15 0905 10/01/15 1320 10/02/15 0500  AST 11* 12* 11*  ALT 8* 8* 7*  ALKPHOS 66 60 60  BILITOT 0.6 0.8 0.4  PROT 7.0 7.2 7.0  ALBUMIN 1.4* 1.4* 1.4*   No results for input(s): LIPASE, AMYLASE in the last 168 hours.  No results for input(s): AMMONIA in the last 168 hours. CBC:  Recent Labs Lab 09/30/15 0905 10/01/15 0325 10/01/15 1320 10/02/15 0500 10/03/15 0500  WBC 10.6* 11.2* 12.5* 10.5 9.9  NEUTROABS 8.8* 9.2* 10.2* 8.6*  --   HGB 7.7* 7.4* 7.2* 7.0* 7.0*  HCT 25.8* 25.1* 23.7* 24.2* 22.6*  MCV 88.4 88.4 88.1 89.3 88.6  PLT 143* 142* 137* 137* 127*   Cardiac Enzymes:  Recent Labs Lab 09/28/15 0001 09/28/15 0540 09/28/15 1330  CKTOTAL 15*  --   --   CKMB 1.5  --   --    TROPONINI 0.05* 0.06* 0.03   BNP (last 3 results)  Recent Labs  06/30/15 0018 09/20/15 2306  BNP 402.9* 249.6*    ProBNP (last 3 results) No results for input(s): PROBNP in the last 8760 hours.  CBG:  Recent Labs Lab 10/03/15 0030 10/03/15 0408 10/03/15 0825 10/03/15 1240 10/03/15 1634  GLUCAP 118* 106* 99 100* 96    Recent Results (from the past 240 hour(s))  Culture, respiratory (NON-Expectorated)     Status: None   Collection Time: 09/30/15 12:19 PM  Result Value Ref Range Status   Specimen Description TRACHEAL ASPIRATE  Final   Special Requests NONE  Final   Gram Stain   Final    FEW WBC PRESENT,BOTH PMN AND MONONUCLEAR RARE SQUAMOUS EPITHELIAL CELLS PRESENT ABUNDANT GRAM NEGATIVE RODS Performed at [facility]    Culture   Final    ABUNDANT PSEUDOMONAS AERUGINOSA Performed at [facility]    Report Status 10/03/2015 FINAL  Final   Organism ID, Bacteria PSEUDOMONAS AERUGINOSA  Final      Susceptibility   Pseudomonas aeruginosa - MIC*    CEFEPIME 2 SENSITIVE Sensitive     CEFTAZIDIME 2 SENSITIVE Sensitive     CIPROFLOXACIN <=0.25 SENSITIVE Sensitive     GENTAMICIN <=1 SENSITIVE Sensitive     IMIPENEM 2 SENSITIVE Sensitive     PIP/TAZO 8 SENSITIVE Sensitive     TOBRAMYCIN <=1 SENSITIVE Sensitive     * ABUNDANT PSEUDOMONAS AERUGINOSA     Studies:  Recent x-ray studies have been reviewed in detail by the Attending Physician  Scheduled Meds:  Scheduled Meds: . sodium chloride   Intravenous Once  . amiodarone  200 mg Per Tube Daily  . amLODipine  10 mg Per Tube Daily  . antiseptic oral rinse  7 mL Mouth Rinse QID  . chlorhexidine gluconate (SAGE KIT)  15 mL Mouth Rinse BID  . collagenase   Topical Daily  . collagenase   Topical Daily  . darbepoetin (ARANESP) injection - NON-DIALYSIS  60 mcg Subcutaneous Q Thu-1800  . free water  500 mL Per Tube Q4H  . insulin aspart  0-9 Units Subcutaneous TID WC  . metoprolol tartrate  12.5 mg  Per Tube BID  . pantoprazole sodium  40 mg Per Tube BID  . valproic acid  125 mg Per Tube BID    Time spent on care of this patient: 40 mins   Efton Thomley, Geraldo Docker , MD  [facility] Office  [phone] Pager - [phone]  On-Call/Text Page:      [email or web address]      password [email or web address]  If 7PM-7AM, please contact night-coverage [email or web address] Password [facility] 10/03/2015, 7:19 PM   LOS: 12 days   Care during the described time interval was provided by me .  I have reviewed this patient's available data, including medical history, events of note, physical examination, and all test results as part  of my evaluation. I have personally reviewed and interpreted all radiology studies.   Dia Crawford, MD [phone] Pager

## 2015-10-03 NOTE — Progress Notes (Signed)
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 Seizure disorder, multiple CVAs, Atrial Fibrillation, Chronic Diastolic CHF CKD, Sacral Decubitus, Rt sided Pleural Effusion with Pleur-X in place, and Lt Hydronephrosis with Chronic Nephrostomy tube.   She has also recently been treated for funguria with diflucan, pseudomonas PNA with ertapenem, and sacral wound has tested positive for MRSA. She was recently started on cefepime and vancomycin, for what is reportedly a MRSA PNA. She has also been requiring frequent blood transfusions for what is thought to be progression of her chronic anemia, however, she has multiple antibodies in her blood and is difficult to find transfusions for. [facility] has listed her as futility of care due to this anemia, however, husband has reportedly been very reluctant to address goals of care.   3/15 Mrs. Jacqueline Fuentes developed hypotension and hemoglobin was noted to be 4.2. She was transfused 1 unit PRBC, which was all that was available to [facility] at the time. Husband was contacted and requested a transfer to the emergency department so that she could receive further transfusions. In the emergency department she was noted to be febrile with leukocytosis. Hemoglobin was confirmed to be low at 5. Transfusions were ordered by ED staff, however, blood bank is unsure they will be able to accommodate this request due to her cross matching difficulties. Broad spectrum antibiotics were resumed.  HPI/Subjective: 3/ 30 afebrile overnight, A/O 0, grimaces to painful stimuli, follows no commands,  Assessment/Plan: Chronic respiratory failure with ventilator dependence -Per [facility] -Continue below settings on vent.  Recurrent Right effusion with PleurX Per PCCM  Essential  hypertension -Amlodipine 10 mg daily -Occult blood card pending  Atrial Fibrillation -Amiodarone 200 mg daily -Metoprolol 12.5 mg/tube BID  Acute on chronic Diastolic CHF -Transfuse for hemoglobin< 7 (secondary to patient needing difficult blood to obtain, cross matching difficulties) -No echocardiogram.available. Echocardiogram pending  Acute on CKD (baseline Cr= 0.97 obtained 06/30/15)  -Lt  hydronephrosis with nephrostomy tube (chronic) -Chronic indwelling foley catheter -Not a dialysis candidate  Acute blood loss anemia/Hematuria -Possible GIB. Code blood card pending -Urinalysis pending (urine Tea colored),. -Protonix IV BID -No real interventions available.   Severe protein calorie malnutrition  -TF's running  Sepsis unspecified organism, source unclear consider . -Multifactorial PNA, UTI, Sacral wound, or any number of chronic indwelling lines/tubes -Course antibiotics complete D/C abx. -Trend WBC and fever curves -Follow Blood, urine and sputum culture  Seizure disorder -Valproic acid, 125 mg BID  Anxiety/depression Continue to hold lexapro, trazodone    H/o several CVA  Hypomagnesemia -Magnesium goal> 2   Hypernatremia -Increase Free water 524m q 4 hr  Osteomyelitis/Sacral Decubitus Ulcer unstageable -Obtain L/S spine x-ray -PT for Hydrotherapy daily; appears will require at least 4-5 additional days of treatment before wound VAC can be applied    Chronic pain   Goals of care -Consult to palliative care; per admission note [facility] has labeled any further aggressive care as futileMultisystem organ failure, quadriplegia, discuss change of code, short-term vs long-term goals of care. Hospice?  -Patient no longer accepted at [facility] or any other SNF    Code Status: FULL Family Communication: no family present at time of exam Disposition Plan: [facility] on Tuesday    Consultants: [facility] PCCM   Procedure/Significant Events: 3/15 transfused 1  unit PRBC, at [facility] 3/16 transfused 2 units PRBC 3/17 transfused 1 unit PRBC 3/21 transfused 1unit PRBC    Culture 3/15 blood right hand/forearm negative final Urine 3/16 >neg 3/25 trach aspirate pending    Antibiotics: Meropenem 3/16 >3/22 Vancomycin 3/16 >3/22  DVT prophylaxis: SCD   Devices   LINES / TUBES:  Trach #7 cuffed >> RUE PICC 2/3 >>> Rt chest tube>>    Continuous Infusions: . feeding supplement (VITAL AF 1.2 CAL) 1,000 mL (10/03/15 0459)    Objective: VITAL SIGNS: Temp: 99.1 F (37.3 C) (03/28 0355) Temp Source: Axillary (03/28 0355) BP: 146/83 mmHg (03/28 0803) Pulse Rate: 80 (03/28 0803) Vent mode; PCV SPO2; 100% FIO2: 40% PC; 25 PEEP; 5 RR spontaneous; 7 RR total; 25     Intake/Output Summary (Last 24 hours) at 10/03/15 0815 Last data filed at 10/03/15 0600  Gross per 24 hour  Intake   3220 ml  Output   2075 ml  Net   1145 ml     Exam: General: A/O 0, grimaces to painful stimuli, follows no commands, No acute respiratory distress Eyes:  negative scleral hemorrhage ENT: Negative Runny nose, negative gingival bleeding, Neck:  Negative scars, masses, torticollis, lymphadenopathy, JVD Lungs: coarse breath sounds bilateral (on ventilator), without wheezes or crackles Cardiovascular: Tachycardic, Regular rhythm without murmur gallop or rub normal S1 and S2 Abdomen: Morbidly obese, negative abdominal pain, nondistended, positive soft, bowel sounds, no rebound, no ascites, no appreciable mass, PEG tube in place negative sign of infection. Left nephrostomy tube in place draining minimal Tea colored fluid. GU; hematuria present; LARGE SACRAL DECUBITUS ULCER, 9 INCHES IN DIAMETER, tracking cephalad 6 cm, large amount of serosanguineous fluid, purulent discharge. Smaller ulcer posterior right thigh stage I Extremities: all extremities mildly to moderately contracted does not  withdraw to painful stimuli  Psychiatric: Unable to assess  Neurologic:  No spontaneous movement of extremities, no withdrawal to painful stimuli of extremities.     Data Reviewed: Basic Metabolic Panel:  Recent Labs Lab 09/28/15 0540  09/30/15 0609 09/30/15 0905 10/01/15 1320 10/02/15 0500 10/03/15 0500  NA 156*  < > 159* 159* 158* 158* 154*  K 3.5  < > 5.3* 4.1 4.1 3.7 3.4*  CL 111  < > 119* 119* 119* 118* 117*  CO2 30  < > _0 GLUCOSE 181*  < > 119* 120* 110* 111* 116*  BUN 127*  < > 108* 100* 84* 75* 72*  CREATININE 2.71*  < > 2.43* 2.19* 2.10* 1.93* 1.95*  CALCIUM 8.3*  < > 8.6* 8.4* 8.4* 8.1* 8.1*  MG 1.9  --   --  1.8 2.0 1.9 1.7  PHOS 3.8  --   --   --   --   --   --   < > = values in this interval not displayed. Liver Function Tests:  Recent Labs Lab 09/30/15 0905 10/01/15 1320 10/02/15 0500  AST 11* 12* 11*  ALT 8* 8* 7*  ALKPHOS 66 60 60  BILITOT 0.6 0.8 0.4  PROT 7.0 7.2 7.0  ALBUMIN 1.4* 1.4* 1.4*   No results for input(s): LIPASE, AMYLASE in the last 168 hours. No results for input(s): AMMONIA in the last 168 hours. CBC:  Recent Labs Lab 09/30/15 0905 10/01/15 0325 10/01/15 1320 10/02/15 0500 10/03/15 0500  WBC 10.6* 11.2* 12.5* 10.5 9.9  NEUTROABS 8.8* 9.2* 10.2* 8.6*  --   HGB 7.7* 7.4* 7.2* 7.0* 7.0*  HCT 25.8* 25.1* 23.7*  24.2* 22.6*  MCV 88.4 88.4 88.1 89.3 88.6  PLT 143* 142* 137* 137* 127*   Cardiac Enzymes:  Recent Labs Lab 09/28/15 0001 09/28/15 0540 09/28/15 1330  CKTOTAL 15*  --   --   CKMB 1.5  --   --   TROPONINI 0.05* 0.06* 0.03   BNP (last 3 results)  Recent Labs  06/30/15 0018 09/20/15 2306  BNP 402.9* 249.6*    ProBNP (last 3 results) No results for input(s): PROBNP in the last 8760 hours.  CBG:  Recent Labs Lab 10/02/15 1217 10/02/15 1752 10/02/15 2008 10/03/15 0030 10/03/15 0408  GLUCAP 99 113* 110* 118* 106*    Recent Results (from the past 240 hour(s))  Culture, respiratory  (NON-Expectorated)     Status: None   Collection Time: 09/30/15 12:19 PM  Result Value Ref Range Status   Specimen Description TRACHEAL ASPIRATE  Final   Special Requests NONE  Final   Gram Stain   Final    FEW WBC PRESENT,BOTH PMN AND MONONUCLEAR RARE SQUAMOUS EPITHELIAL CELLS PRESENT ABUNDANT GRAM NEGATIVE RODS Performed at [facility]    Culture   Final    ABUNDANT PSEUDOMONAS AERUGINOSA Performed at [facility]    Report Status 10/03/2015 FINAL  Final   Organism ID, Bacteria PSEUDOMONAS AERUGINOSA  Final      Susceptibility   Pseudomonas aeruginosa - MIC*    CEFEPIME 2 SENSITIVE Sensitive     CEFTAZIDIME 2 SENSITIVE Sensitive     CIPROFLOXACIN <=0.25 SENSITIVE Sensitive     GENTAMICIN <=1 SENSITIVE Sensitive     IMIPENEM 2 SENSITIVE Sensitive     PIP/TAZO 8 SENSITIVE Sensitive     TOBRAMYCIN <=1 SENSITIVE Sensitive     * ABUNDANT PSEUDOMONAS AERUGINOSA     Studies:  Recent x-ray studies have been reviewed in detail by the Attending Physician  Scheduled Meds:  Scheduled Meds: . amiodarone  200 mg Per Tube Daily  . amLODipine  10 mg Per Tube Daily  . antiseptic oral rinse  7 mL Mouth Rinse QID  . chlorhexidine gluconate (SAGE KIT)  15 mL Mouth Rinse BID  . collagenase   Topical Daily  . collagenase   Topical Daily  . darbepoetin (ARANESP) injection - NON-DIALYSIS  60 mcg Subcutaneous Q Thu-1800  . free water  400 mL Per Tube Q4H  . insulin aspart  0-9 Units Subcutaneous TID WC  . metoprolol tartrate  12.5 mg Per Tube BID  . pantoprazole sodium  40 mg Per Tube BID  . valproic acid  125 mg Per Tube BID    Time spent on care of this patient: 40 mins   Anjali Manzella, Geraldo Docker , MD  [facility] Office  [phone] Pager - [phone]  On-Call/Text Page:      [email or web address]      password [email or web address]  If 7PM-7AM, please contact night-coverage [email or web address] Password [email or web address] 10/03/2015, 8:15 AM   LOS: 12 days   Care during the described time interval  was provided by me .  I have reviewed this patient's available data, including medical history, events of note, physical examination, and all test results as part of my evaluation. I have personally reviewed and interpreted all radiology studies.   Dia Crawford, MD [phone] Pager

## 2015-10-03 NOTE — Progress Notes (Addendum)
Nutrition Follow Up  DOCUMENTATION CODES:   Obesity unspecified  INTERVENTION:    Increase Vital AF 1.2 formula via PEG tube to goal rate of 55 ml/hr to provide 1584 kcal, 99 grams of protein, 1017m of free water  NUTRITION DIAGNOSIS:   Inadequate oral intake related to inability to eat as evidenced by NPO status, ongoing  GOAL:   Patient will meet greater than or equal to 90% of their needs, met  MONITOR:   Vent status, Labs, Weight trends, TF tolerance, Skin, I & O's  ASSESSMENT:   Pt from [facility] who is trach/vent/PEG dependent at baseline. She has complicated past medical history as below, which includes seizure disorder, multiple CVAs, atrial fibrillation, chronic kidney disease, sacral decubitus, R sided pleural effusion with Pleur-X in place, diastolic CHF, and L hydronephrosis with chronic nephrostomy tube.   Patient is currently on ventilator support via trach MV: 8.4 L/min Temp (24hrs), Avg:98.8 F (37.1 C), Min:98.5 F (36.9 C), Max:99.1 F (37.3 C)   CWOCN note 3/27 reviewed.  Palliative Care Team following.  Free water flushes added today at 500 ml every 4 hours.  Vital AF 1.2 formula off for hydrotherapy at time of visit.  Previous goal rate at 50 ml/hr.  Will increase to better meet re-estimated kcal needs.  Diet Order:  Diet NPO time specified  Skin:   Unstageable pressure injury to right inner heel  Right outer foot with dark purple-red deep tissue injury Inner ankle with partial thickness wound Right posterior leg with full thickness wound Right posterior calf with unstageable pressure injury Sacrum with stage 4 pressure injury  Last BM:  3/28  Height:   Ht Readings from Last 1 Encounters:  09/21/15 '5\' 8"'  (1.727 m)    Weight:   Wt Readings from Last 1 Encounters:  10/03/15 178 lb (80.74 kg)    Ideal Body Weight:  55.6 kg (-12.5% for quadriplegia)  BMI:  Body mass index is 27.07 kg/(m^2).  Estimated Nutritional Needs:   Kcal:   14099 Protein:  85-100 gm  Fluid:  >/= 1.5 L  EDUCATION NEEDS:   No education needs identified at this time  KArthur Holms RD, LDN Pager #: [phone]-Hours Pager #: [phone]

## 2015-10-03 NOTE — Progress Notes (Signed)
   Patient seen and examined. Patient will receive 1 unit of packed red blood cells. She is to return to [facility] after blood transfusion. PCCM will sign off. Call back if with questions. Cont vent support.   Monica Becton, MD 10/03/2015, 1:21 PM [facility] Pager [phone] After 3 pm or if no answer, call [phone]

## 2015-10-03 NOTE — Clinical Documentation Improvement (Signed)
Internal Medicine  Can the diagnosis of "new onset AF" documented in 3/22 progress note be further specified? Please document response in next progress note. Thank you!   Chronic Atrial fibrillation  Paroxysmal Atrial fibrillation  Permanent Atrial fibrillation  Persistent Atrial fibrillation  Other  Clinically Undetermined  Document any associated diagnoses/conditions.  Supporting Information:  Please exercise your independent, professional judgment when responding. A specific answer is not anticipated or expected.  Thank You,  Zoila Shutter RN, BSN, [place] [phone]; Cell: [phone]

## 2015-10-04 DIAGNOSIS — A419 Sepsis, unspecified organism: Secondary | ICD-10-CM | POA: Diagnosis not present

## 2015-10-04 DIAGNOSIS — E870 Hyperosmolality and hypernatremia: Secondary | ICD-10-CM | POA: Diagnosis not present

## 2015-10-04 DIAGNOSIS — J189 Pneumonia, unspecified organism: Secondary | ICD-10-CM | POA: Diagnosis not present

## 2015-10-04 DIAGNOSIS — D62 Acute posthemorrhagic anemia: Secondary | ICD-10-CM | POA: Diagnosis not present

## 2015-10-04 DIAGNOSIS — J9611 Chronic respiratory failure with hypoxia: Secondary | ICD-10-CM | POA: Diagnosis not present

## 2015-10-04 LAB — CBC
HEMATOCRIT: 24.7 % — AB (ref 36.0–46.0)
Hemoglobin: 7.3 g/dL — ABNORMAL LOW (ref 12.0–15.0)
MCH: 25.6 pg — ABNORMAL LOW (ref 26.0–34.0)
MCHC: 29.6 g/dL — ABNORMAL LOW (ref 30.0–36.0)
MCV: 86.7 fL (ref 78.0–100.0)
Platelets: 129 10*3/uL — ABNORMAL LOW (ref 150–400)
RBC: 2.85 MIL/uL — ABNORMAL LOW (ref 3.87–5.11)
RDW: 20 % — AB (ref 11.5–15.5)
WBC: 9.4 10*3/uL (ref 4.0–10.5)

## 2015-10-04 LAB — TYPE AND SCREEN
ABO/RH(D): O POS
ANTIBODY SCREEN: POSITIVE
DAT, IgG: NEGATIVE
UNIT DIVISION: 0

## 2015-10-04 LAB — GLUCOSE, CAPILLARY
GLUCOSE-CAPILLARY: 89 mg/dL (ref 65–99)
GLUCOSE-CAPILLARY: 99 mg/dL (ref 65–99)
GLUCOSE-CAPILLARY: 95 mg/dL (ref 65–99)
Glucose-Capillary: 101 mg/dL — ABNORMAL HIGH (ref 65–99)
Glucose-Capillary: 111 mg/dL — ABNORMAL HIGH (ref 65–99)
Glucose-Capillary: 98 mg/dL (ref 65–99)

## 2015-10-04 LAB — BASIC METABOLIC PANEL
Anion gap: 7 (ref 5–15)
BUN: 63 mg/dL — ABNORMAL HIGH (ref 6–20)
CO2: 31 mmol/L (ref 22–32)
Calcium: 8.3 mg/dL — ABNORMAL LOW (ref 8.9–10.3)
Chloride: 113 mmol/L — ABNORMAL HIGH (ref 101–111)
Creatinine, Ser: 1.83 mg/dL — ABNORMAL HIGH (ref 0.44–1.00)
GFR calc Af Amer: 32 mL/min — ABNORMAL LOW (ref >60)
GFR calc non Af Amer: 28 mL/min — ABNORMAL LOW (ref >60)
Glucose, Bld: 99 mg/dL (ref 65–99)
Potassium: 3.9 mmol/L (ref 3.5–5.1)
Sodium: 151 mmol/L — ABNORMAL HIGH (ref 135–145)

## 2015-10-04 NOTE — Progress Notes (Signed)
[place] TEAM 1 - Stepdown/ICU TEAM PROGRESS NOTE  Jacqueline Fuentes YPP:[id number] DOB: 07-22-1950 DOA: 09/20/2015 PCP: Verneita Griffes, MD  Admit HPI / Brief Narrative: 65 year old F from [facility] w/ a PMHxQuadriplegia, Trach/Vent/PEG dependent, Seizure disorder, multiple CVAs, Atrial Fibrillation, Chronic Diastolic CHF CKD, Sacral Decubitus, Rt sided Pleural Effusion with Pleur-X in place, and Lt Hydronephrosis with Chronic Nephrostomy tube who was recently treated for funguria with diflucan, pseudomonas PNA with ertapenem, and sacral wound positive for MRSA. She was also started on cefepime and vancomycin, for what was reportedly a MRSA PNA. She had been requiring frequent blood transfusions for what was thought to be progression of her chronic anemia, however, she has multiple antibodies in her blood and is difficult to match. [facility] listed her as futility of care due to this anemia, however husband had reportedly been very reluctant to address goals of care.   On 3/15 Jacqueline Fuentes developed hypotension and hemoglobin was noted to be 4.2. She was transfused 1 unit PRBC, which was all that was available to [facility] at the time. Husband was contacted and requested a transfer to the emergency department so that she could receive further transfusions. In the emergency department she was noted to be febrile with leukocytosis. Hemoglobin was confirmed to be low at 5. Transfusions were ordered by ED staff.  HPI/Subjective: The patient is unresponsive and non-communicative.  There is no family present.  Assessment/Plan:  Chronic respiratory failure with ventilator dependence -vent management per PCCM  Recurrent Right effusion with PleurX -management per PCCM  Widened superior mediastinum Noted on CXR - Radiology suggests CT chest to further evaluate - order placed to investigate further   Essential hypertension -Blood pressure currently well controlled  Chronic Atrial Fibrillation -Amiodarone +  metoprolol - heart rate controlled - not a candidate for anticoagulation due to recurring severe anemia  Chronic Diastolic CHF -Echocardiogram confirms preserved systolic fxn and findings c/w only mild/modest diastolic dysfxn - no evidence of severe volume overload at present - ?accuracy of daily weights - follow - I/Os ~ even  Filed Weights   10/02/15 0355 10/03/15 0800 10/04/15 0500  Weight: 75.297 kg (166 lb) 80.74 kg (178 lb) 88.905 kg (196 lb)   Acute on CKD (baseline Cr 0.97 06/30/15)  -Lt hydronephrosis with nephrostomy tube (chronic) -Chronic indwelling foley catheter -Not a dialysis candidate -Creatinine stable   Acute blood loss + anemia of chronic illness -Suspected low-grade ongoing loss related to hematuria+/- GIB -no further interventions available - anemia treated w/ blood transfusions x4 units this admit  -Hgb stable for now   Severe protein calorie malnutrition  -Continue tube feeds  Sepsis unspecified organism, source unclear consider -Multifactorial PNA, UTI, Sacral wound, or any number of chronic indwelling lines/tubes -Course antibiotics completed and now off antibiotics -Afebrile with normal white blood cell count   Seizure disorder -Continue usual medical therapy  Anxiety/depression -Unable to assess as patient is obtunded   Multiple prior CVAs  Hypomagnesemia -Corrected  Hypernatremia -improving w/ free water   Osteomyelitis/Sacral Decubitus Ulcer unstageable -Care as directed by WOC  Chronic pain  Code Status: FULL Family Communication: no family present at time of exam Disposition Plan: SDU - was to return to [facility] today, but [facility] now states they don't have an available bed - search for additional beds ongoing   Consultants: PCCM Palliative Care   Procedures: none  Antibiotics: Meropenem 3/16 > 3/22 Vancomycin 3/16 > 3/22  DVT prophylaxis: SCDs  Objective: Blood pressure 127/69, pulse 78,  temperature 98.6 F (37 C),  temperature source Oral, resp. rate 0, height '5\' 8"'  (1.727 m), weight 88.905 kg (196 lb), SpO2 99 %.  Intake/Output Summary (Last 24 hours) at 10/04/15 1547 Last data filed at 10/04/15 1500  Gross per 24 hour  Intake 3438.25 ml  Output   1000 ml  Net 2438.25 ml   Exam: General: No acute respiratory distress evident on vent  Lungs: Poor air movement bilateral bases  Cardiovascular: Regular rate without murmur  Abdomen: Nondistended, soft Extremities: No significant cyanosis, or clubbing  Data Reviewed:  Basic Metabolic Panel:  Recent Labs Lab 09/28/15 0540  09/30/15 0905 10/01/15 1320 10/02/15 0500 10/03/15 0500 10/04/15 0600  NA 156*  < > 159* 158* 158* 154* 151*  K 3.5  < > 4.1 4.1 3.7 3.4* 3.9  CL 111  < > 119* 119* 118* 117* 113*  CO2 30  < > '29 29 30 31 31  ' GLUCOSE 181*  < > 120* 110* 111* 116* 99  BUN 127*  < > 100* 84* 75* 72* 63*  CREATININE 2.71*  < > 2.19* 2.10* 1.93* 1.95* 1.83*  CALCIUM 8.3*  < > 8.4* 8.4* 8.1* 8.1* 8.3*  MG 1.9  --  1.8 2.0 1.9 1.7  --   PHOS 3.8  --   --   --   --   --   --   < > = values in this interval not displayed.  CBC:  Recent Labs Lab 09/30/15 0905 10/01/15 0325 10/01/15 1320 10/02/15 0500 10/03/15 0500 10/04/15 0657  WBC 10.6* 11.2* 12.5* 10.5 9.9 9.4  NEUTROABS 8.8* 9.2* 10.2* 8.6*  --   --   HGB 7.7* 7.4* 7.2* 7.0* 7.0* 7.3*  HCT 25.8* 25.1* 23.7* 24.2* 22.6* 24.7*  MCV 88.4 88.4 88.1 89.3 88.6 86.7  PLT 143* 142* 137* 137* 127* 129*    Liver Function Tests:  Recent Labs Lab 09/30/15 0905 10/01/15 1320 10/02/15 0500  AST 11* 12* 11*  ALT 8* 8* 7*  ALKPHOS 66 60 60  BILITOT 0.6 0.8 0.4  PROT 7.0 7.2 7.0  ALBUMIN 1.4* 1.4* 1.4*   Cardiac Enzymes:  Recent Labs Lab 09/28/15 0001 09/28/15 0540 09/28/15 1330  CKTOTAL 15*  --   --   CKMB 1.5  --   --   TROPONINI 0.05* 0.06* 0.03    CBG:  Recent Labs Lab 10/03/15 2141 10/04/15 0005 10/04/15 0426 10/04/15 0823 10/04/15 1219  GLUCAP 91 111*  101* 89 99    Recent Results (from the past 240 hour(s))  Culture, respiratory (NON-Expectorated)     Status: None   Collection Time: 09/30/15 12:19 PM  Result Value Ref Range Status   Specimen Description TRACHEAL ASPIRATE  Final   Special Requests NONE  Final   Gram Stain   Final    FEW WBC PRESENT,BOTH PMN AND MONONUCLEAR RARE SQUAMOUS EPITHELIAL CELLS PRESENT ABUNDANT GRAM NEGATIVE RODS Performed at [facility]    Culture   Final    ABUNDANT PSEUDOMONAS AERUGINOSA Performed at [facility]    Report Status 10/03/2015 FINAL  Final   Organism ID, Bacteria PSEUDOMONAS AERUGINOSA  Final      Susceptibility   Pseudomonas aeruginosa - MIC*    CEFEPIME 2 SENSITIVE Sensitive     CEFTAZIDIME 2 SENSITIVE Sensitive     CIPROFLOXACIN <=0.25 SENSITIVE Sensitive     GENTAMICIN <=1 SENSITIVE Sensitive     IMIPENEM 2 SENSITIVE Sensitive     PIP/TAZO 8 SENSITIVE  Sensitive     TOBRAMYCIN <=1 SENSITIVE Sensitive     * ABUNDANT PSEUDOMONAS AERUGINOSA     Studies:   Recent x-ray studies have been reviewed in detail by the Attending Physician  Scheduled Meds:  Scheduled Meds: . sodium chloride   Intravenous Once  . amiodarone  200 mg Per Tube Daily  . amLODipine  10 mg Per Tube Daily  . antiseptic oral rinse  7 mL Mouth Rinse QID  . chlorhexidine gluconate (SAGE KIT)  15 mL Mouth Rinse BID  . collagenase   Topical Daily  . collagenase   Topical Daily  . darbepoetin (ARANESP) injection - NON-DIALYSIS  60 mcg Subcutaneous Q Thu-1800  . free water  500 mL Per Tube Q4H  . insulin aspart  0-9 Units Subcutaneous TID WC  . metoprolol tartrate  12.5 mg Per Tube BID  . pantoprazole sodium  40 mg Per Tube BID  . valproic acid  125 mg Per Tube BID    Time spent on care of this patient: 25 mins   Stedman [place] T , MD   [facility] Office  [phone] Pager - Text Page per Shea Evans as per below:  On-Call/Text Page:      [email or web address]      password [email or web address]  If  7PM-7AM, please contact night-coverage [email or web address] Password [email or web address] 10/04/2015, 3:47 PM   LOS: 13 days

## 2015-10-04 NOTE — Progress Notes (Addendum)
Planned DC from [facility] was canceled- no other planned DCs for this week  CSW had already initiated search- CSW called [facility] but they are unable to do hydrotherapy for wound if MD still plans to DC with this.  CSW called [facility]- they continue to be full  CSW left message for [facility]- update- they currently have no bed availability  [facility]- sent updated clinicals- awaiting response- *update- they are unable to do hydrotherapy  [facility] in [place]- has one facility able to do hydrotherapy but no beds available at this time  CSW will continue to follow  Domenica Reamer, [place] Worker [phone]

## 2015-10-04 NOTE — Progress Notes (Signed)
Physical Therapy Wound Treatment Patient Details  Name: CAMBREE HENDRIX MRN: [id number] Date of Birth: [birth year]  Today's Date: 10/04/2015 Time: 0930-1038 Time Calculation (min): 68 min  Subjective  Subjective: Pt nonverbal throughout session Patient and Family Stated Goals: Non stated Date of Onset:  (Pt admitted with sacral wound)  Pain Score: Pt premedicated prior to session. Noted mild grimacing during packing of wound.  Wound Assessment  Pressure Ulcer 09/21/15 Unstageable - Full thickness tissue loss in which the base of the ulcer is covered by slough (yellow, tan, gray, green or brown) and/or eschar (tan, brown or black) in the wound bed. (Active)  Dressing Type ABD;Gauze (Comment);Moist to dry 10/04/2015 11:28 AM  Dressing Clean;Dry;Intact 10/04/2015 11:28 AM  Dressing Change Frequency Daily 10/04/2015 11:28 AM  State of Healing Eschar 10/04/2015 11:28 AM  Site / Wound Assessment Bleeding;Black;Brown;Pink;Red;Yellow 10/04/2015 11:28 AM  % Wound base Red or Granulating 50% 10/04/2015 11:28 AM  % Wound base Yellow 40% 10/04/2015 11:28 AM  % Wound base Black 10% 10/04/2015 11:28 AM  % Wound base Other (Comment) 100% 09/28/2015  5:35 PM  Peri-wound Assessment Intact;Induration 10/04/2015 11:28 AM  Wound Length (cm) 18 cm 10/02/2015  9:00 AM  Wound Width (cm) 17 cm 10/02/2015  9:00 AM  Wound Depth (cm) 4.1 cm 10/02/2015  9:00 AM  Tunneling (cm) 5.5 09/21/2015  5:00 AM  Undermining (cm) 4.3cm 7 o'clock to 10 o'clock; 3.5cm 11 o'clock to 3 o'clock, 3.1cm 3 o'clock to 6 o'clock 10/02/2015  9:00 AM  Margins Unattached edges (unapproximated) 10/04/2015 11:28 AM  Drainage Amount Moderate 10/04/2015 11:28 AM  Drainage Description Serosanguineous 10/04/2015 11:28 AM  Treatment Debridement (Selective);Hydrotherapy (Pulse lavage);Packing (Saline gauze) 10/04/2015 11:28 AM  Santyl applied to wound bed prior to applying dressing.   Hydrotherapy Pulsed lavage therapy - wound location: Sacrum Pulsed Lavage  with Suction (psi): 8 psi Pulsed Lavage with Suction - Normal Saline Used: 2000 mL Pulsed Lavage Tip: Tip with splash shield Selective Debridement Selective Debridement - Location: Sacrum Selective Debridement - Tools Used: Forceps;Scalpel;Scissors Selective Debridement - Tissue Removed: Yellow and black/brown necrotic tissue   Wound Assessment and Plan  Wound Therapy - Assess/Plan/Recommendations Wound Therapy - Clinical Statement: Progressing with selective removal of non-viable tissue. Wound Therapy - Functional Problem List: Decreased tolerance of OOB and sitting position.  Factors Delaying/Impairing Wound Healing: Multiple medical problems;Immobility Hydrotherapy Plan: Debridement;Dressing change;Patient/family education;Pulsatile lavage with suction Wound Therapy - Frequency: 6X / week Wound Therapy - Follow Up Recommendations: Other (comment) (Per chart review plan is for return to [facility]) Wound Plan: See above  Wound Therapy Goals- Improve the function of patient's integumentary system by progressing the wound(s) through the phases of wound healing (inflammation - proliferation - remodeling) by: Decrease Necrotic Tissue to: 25% Decrease Necrotic Tissue - Progress: Progressing toward goal Increase Granulation Tissue to: 75% Increase Granulation Tissue - Progress: Progressing toward goal Goals/treatment plan/discharge plan were made with and agreed upon by patient/family: No, Patient unable to participate in goals/treatment/discharge plan and family unavailable Time For Goal Achievement: 7 days Wound Therapy - Potential for Goals: Fair  Goals will be updated until maximal potential achieved or discharge criteria met.  Discharge criteria: when goals achieved, discharge from hospital, MD decision/surgical intervention, no progress towards goals, refusal/missing three consecutive treatments without notification or medical reason.  GP     Rolinda Roan 10/04/2015, 11:34  AM  Rolinda Roan, PT, DPT Acute Rehabilitation Services Pager: [phone]

## 2015-10-05 DIAGNOSIS — L8995 Pressure ulcer of unspecified site, unstageable: Secondary | ICD-10-CM

## 2015-10-05 DIAGNOSIS — A419 Sepsis, unspecified organism: Secondary | ICD-10-CM | POA: Diagnosis not present

## 2015-10-05 DIAGNOSIS — D62 Acute posthemorrhagic anemia: Secondary | ICD-10-CM | POA: Diagnosis not present

## 2015-10-05 DIAGNOSIS — D649 Anemia, unspecified: Secondary | ICD-10-CM | POA: Diagnosis not present

## 2015-10-05 DIAGNOSIS — J189 Pneumonia, unspecified organism: Secondary | ICD-10-CM | POA: Diagnosis not present

## 2015-10-05 DIAGNOSIS — M8610 Other acute osteomyelitis, unspecified site: Secondary | ICD-10-CM

## 2015-10-05 DIAGNOSIS — I482 Chronic atrial fibrillation: Secondary | ICD-10-CM | POA: Diagnosis not present

## 2015-10-05 LAB — GLUCOSE, CAPILLARY
GLUCOSE-CAPILLARY: 89 mg/dL (ref 65–99)
GLUCOSE-CAPILLARY: 97 mg/dL (ref 65–99)
Glucose-Capillary: 101 mg/dL — ABNORMAL HIGH (ref 65–99)
Glucose-Capillary: 106 mg/dL — ABNORMAL HIGH (ref 65–99)
Glucose-Capillary: 86 mg/dL (ref 65–99)
Glucose-Capillary: 95 mg/dL (ref 65–99)

## 2015-10-05 NOTE — Progress Notes (Signed)
10/05/2015- Respiratory care note- Pt continues on full vent support.  Pt has a #7 cuffed Portex in place.  Site clean and dry at time of eval with trach secured with velcro ties.  Trach care supplies at bedside.  No issues noted at time of eval.  Will cont to follow progress.

## 2015-10-05 NOTE — Care Management Important Message (Signed)
Important Message  Patient Details  Name: Jacqueline Fuentes MRN: [id number] Date of Birth: [birth year]   Medicare Important Message Given:  Yes    Nathen May 10/05/2015, 2:07 PM

## 2015-10-06 ENCOUNTER — Encounter (HOSPITAL_COMMUNITY): Payer: Self-pay | Admitting: Radiology

## 2015-10-06 ENCOUNTER — Inpatient Hospital Stay (HOSPITAL_COMMUNITY): Payer: Medicare Other

## 2015-10-06 DIAGNOSIS — I482 Chronic atrial fibrillation: Secondary | ICD-10-CM | POA: Diagnosis not present

## 2015-10-06 DIAGNOSIS — J189 Pneumonia, unspecified organism: Secondary | ICD-10-CM | POA: Diagnosis not present

## 2015-10-06 DIAGNOSIS — N179 Acute kidney failure, unspecified: Secondary | ICD-10-CM | POA: Diagnosis not present

## 2015-10-06 DIAGNOSIS — M8610 Other acute osteomyelitis, unspecified site: Secondary | ICD-10-CM

## 2015-10-06 DIAGNOSIS — N189 Chronic kidney disease, unspecified: Secondary | ICD-10-CM

## 2015-10-06 DIAGNOSIS — D62 Acute posthemorrhagic anemia: Secondary | ICD-10-CM | POA: Diagnosis not present

## 2015-10-06 DIAGNOSIS — A419 Sepsis, unspecified organism: Secondary | ICD-10-CM | POA: Diagnosis not present

## 2015-10-06 DIAGNOSIS — J9611 Chronic respiratory failure with hypoxia: Secondary | ICD-10-CM | POA: Diagnosis not present

## 2015-10-06 LAB — BASIC METABOLIC PANEL
ANION GAP: 6 (ref 5–15)
BUN: 53 mg/dL — ABNORMAL HIGH (ref 6–20)
CALCIUM: 8.3 mg/dL — AB (ref 8.9–10.3)
CHLORIDE: 109 mmol/L (ref 101–111)
CO2: 31 mmol/L (ref 22–32)
CREATININE: 1.77 mg/dL — AB (ref 0.44–1.00)
GFR, EST AFRICAN AMERICAN: 34 mL/min — AB (ref >60)
GFR, EST NON AFRICAN AMERICAN: 29 mL/min — AB (ref >60)
GLUCOSE: 116 mg/dL — AB (ref 65–99)
Potassium: 3.5 mmol/L (ref 3.5–5.1)
Sodium: 146 mmol/L — ABNORMAL HIGH (ref 135–145)

## 2015-10-06 LAB — GLUCOSE, CAPILLARY
GLUCOSE-CAPILLARY: 102 mg/dL — AB (ref 65–99)
GLUCOSE-CAPILLARY: 105 mg/dL — AB (ref 65–99)
Glucose-Capillary: 101 mg/dL — ABNORMAL HIGH (ref 65–99)
Glucose-Capillary: 125 mg/dL — ABNORMAL HIGH (ref 65–99)
Glucose-Capillary: 112 mg/dL — ABNORMAL HIGH (ref 65–99)
Glucose-Capillary: 96 mg/dL (ref 65–99)

## 2015-10-06 LAB — BRAIN NATRIURETIC PEPTIDE: B NATRIURETIC PEPTIDE 5: 524.2 pg/mL — AB (ref 0.0–100.0)

## 2015-10-06 MED ORDER — IOHEXOL 350 MG/ML SOLN
60.0000 mL | Freq: Once | INTRAVENOUS | Status: AC | PRN
  Administered 2015-10-07: 60 mL via INTRAVENOUS

## 2015-10-06 MED ORDER — CITALOPRAM HYDROBROMIDE 20 MG PO TABS: 20.0000 mg | ORAL_TABLET | Freq: Every day | ORAL | Status: DC

## 2015-10-06 MED ORDER — ALTEPLASE 2 MG IJ SOLR
2.0000 mg | Freq: Once | INTRAMUSCULAR | Status: AC
  Administered 2015-10-07: 2 mg
  Filled 2015-10-06 (×3): qty 2

## 2015-10-06 MED ORDER — FREE WATER
200.0000 mL | Status: DC
  Administered 2015-10-06 – 2015-10-08 (×14): 200 mL

## 2015-10-06 MED ORDER — ALTEPLASE 2 MG IJ SOLR
2.0000 mg | Freq: Once | INTRAMUSCULAR | Status: AC
  Administered 2015-10-07: 2 mg
  Filled 2015-10-06: qty 2

## 2015-10-06 NOTE — Progress Notes (Addendum)
Patient scheduled to have CT Friday afternoon. At this time RN was unable to coordinate with Resp Therapist to leave floor, due to work load. Attempted to get assistance of SWAT RN in place of Resp Therapist. CT has been changed to 19:30.

## 2015-10-06 NOTE — Progress Notes (Signed)
[place] TEAM 1 - Stepdown/ICU TEAM PROGRESS NOTE  CRYSTALLE POPWELL JKD:[id number] DOB: 10/12/1950 DOA: 09/20/2015 PCP: Verneita Griffes, MD  Admit HPI / Brief Narrative: 65 year old F from [facility] w/ a PMHxQuadriplegia, Trach/Vent/PEG dependent, Seizure disorder, multiple CVAs, Atrial Fibrillation, Chronic Diastolic CHF CKD, Sacral Decubitus, Rt sided Pleural Effusion with Pleur-X in place, and Lt Hydronephrosis with Chronic Nephrostomy tube who was recently treated for funguria with diflucan, pseudomonas PNA with ertapenem, and sacral wound positive for MRSA. She was also started on cefepime and vancomycin, for what was reportedly a MRSA PNA. She had been requiring frequent blood transfusions for what was thought to be progression of her chronic anemia, however, she has multiple antibodies in her blood and is difficult to match. [facility] listed her as futility of care due to this anemia, however husband had reportedly been very reluctant to address goals of care.   On 3/15 Mrs. Woodrum developed hypotension and hemoglobin was noted to be 4.2. She was transfused 1 unit PRBC, which was all that was available to [facility] at the time. Husband was contacted and requested a transfer to the emergency department so that she could receive further transfusions. In the emergency department she was noted to be febrile with leukocytosis. Hemoglobin was confirmed to be low at 5. Transfusions were ordered by ED staff.  HPI/Subjective: The patient is unresponsive and non-communicative.  There is no family present.  Assessment/Plan:  Chronic respiratory failure with ventilator dependence vent management per PCCM  Recurrent Right effusion with PleurX f/u CT   Widened superior mediastinum Noted on CXR - Radiology suggests CT chest to further evaluate - this could be helpful in regard to prognosis/goals of care therefore I have ordered a CT   Essential hypertension Blood pressure currently well  controlled  Chronic Atrial Fibrillation Amiodarone + metoprolol - heart rate controlled - not a candidate for anticoagulation due to recurring severe anemia  Chronic Diastolic CHF Echocardiogram confirms preserved systolic fxn and findings c/w only mild/modest diastolic dysfxn - no evidence of severe volume overload at present - ?accuracy of daily weights - follow - I/Os + ~3.6L Filed Weights   10/04/15 0500 10/05/15 0500 10/06/15 0352  Weight: 88.905 kg (196 lb) 90.719 kg (200 lb) 91.173 kg (201 lb)   Acute on CKD (baseline Cr 0.97 06/30/15)  Lt hydronephrosis with nephrostomy tube (chronic) - Chronic indwelling foley catheter - Not a dialysis candidate - creatinine slowly improving   Acute blood loss + anemia of chronic illness -Suspected low-grade ongoing loss related to hematuria+/- GIB -no further interventions available - anemia treated w/ blood transfusions x4 units this admit  -Hgb stable   Severe protein calorie malnutrition  -Continue tube feeds  Sepsis unspecified organism, source unclear consider -Multifactorial PNA, UTI, Sacral wound, or any number of chronic indwelling lines/tubes -Course of antibiotics completed and now off antibiotics -Afebrile with normal white blood cell count   Seizure disorder Continue usual medical therapy  Anxiety/depression Unable to assess as patient is obtunded   Multiple prior CVAs  Hypomagnesemia Corrected  Hypernatremia improving w/ free water   Osteomyelitis/Sacral Decubitus Ulcer unstageable Care as directed by WOC  Chronic pain  Code Status: FULL Family Communication: no family present at time of exam Disposition Plan: SDU - was to return to [facility], but [facility] now states they don't have an available bed - search for additional beds ongoing   Consultants: PCCM Palliative Care   Procedures: none  Antibiotics: Meropenem 3/16 > 3/22 Vancomycin 3/16 >  3/22  DVT prophylaxis: SCDs  Objective: Blood pressure  116/73, pulse 63, temperature 97.6 F (36.4 C), temperature source Axillary, resp. rate 19, height '5\' 8"'  (1.727 m), weight 91.173 kg (201 lb), SpO2 92 %.  Intake/Output Summary (Last 24 hours) at 10/06/15 1443 Last data filed at 10/06/15 1400  Gross per 24 hour  Intake   3330 ml  Output   1375 ml  Net   1955 ml   Exam: General: No acute respiratory distress evident - on vent  Lungs: Poor air movement bilateral bases - no wheeze Cardiovascular: Regular rate without murmur or gallup  Abdomen: Nondistended, soft, no mass  Extremities: No significant cyanosis, clubbing, or edema   Data Reviewed:  Basic Metabolic Panel:  Recent Labs Lab 09/30/15 0905 10/01/15 1320 10/02/15 0500 10/03/15 0500 10/04/15 0600 10/06/15 0519  NA 159* 158* 158* 154* 151* 146*  K 4.1 4.1 3.7 3.4* 3.9 3.5  CL 119* 119* 118* 117* 113* 109  CO2 '29 29 30 31 31 31  ' GLUCOSE 120* 110* 111* 116* 99 116*  BUN 100* 84* 75* 72* 63* 53*  CREATININE 2.19* 2.10* 1.93* 1.95* 1.83* 1.77*  CALCIUM 8.4* 8.4* 8.1* 8.1* 8.3* 8.3*  MG 1.8 2.0 1.9 1.7  --   --     CBC:  Recent Labs Lab 09/30/15 0905 10/01/15 0325 10/01/15 1320 10/02/15 0500 10/03/15 0500 10/04/15 0657  WBC 10.6* 11.2* 12.5* 10.5 9.9 9.4  NEUTROABS 8.8* 9.2* 10.2* 8.6*  --   --   HGB 7.7* 7.4* 7.2* 7.0* 7.0* 7.3*  HCT 25.8* 25.1* 23.7* 24.2* 22.6* 24.7*  MCV 88.4 88.4 88.1 89.3 88.6 86.7  PLT 143* 142* 137* 137* 127* 129*    Liver Function Tests:  Recent Labs Lab 09/30/15 0905 10/01/15 1320 10/02/15 0500  AST 11* 12* 11*  ALT 8* 8* 7*  ALKPHOS 66 60 60  BILITOT 0.6 0.8 0.4  PROT 7.0 7.2 7.0  ALBUMIN 1.4* 1.4* 1.4*   CBG:  Recent Labs Lab 10/05/15 2034 10/05/15 2339 10/06/15 0348 10/06/15 0800 10/06/15 1256  GLUCAP 97 102* 101* 105* 125*    Recent Results (from the past 240 hour(s))  Culture, respiratory (NON-Expectorated)     Status: None   Collection Time: 09/30/15 12:19 PM  Result Value Ref Range Status    Specimen Description TRACHEAL ASPIRATE  Final   Special Requests NONE  Final   Gram Stain   Final    FEW WBC PRESENT,BOTH PMN AND MONONUCLEAR RARE SQUAMOUS EPITHELIAL CELLS PRESENT ABUNDANT GRAM NEGATIVE RODS Performed at [facility]    Culture   Final    ABUNDANT PSEUDOMONAS AERUGINOSA Performed at [facility]    Report Status 10/03/2015 FINAL  Final   Organism ID, Bacteria PSEUDOMONAS AERUGINOSA  Final      Susceptibility   Pseudomonas aeruginosa - MIC*    CEFEPIME 2 SENSITIVE Sensitive     CEFTAZIDIME 2 SENSITIVE Sensitive     CIPROFLOXACIN <=0.25 SENSITIVE Sensitive     GENTAMICIN <=1 SENSITIVE Sensitive     IMIPENEM 2 SENSITIVE Sensitive     PIP/TAZO 8 SENSITIVE Sensitive     TOBRAMYCIN <=1 SENSITIVE Sensitive     * ABUNDANT PSEUDOMONAS AERUGINOSA     Studies:   Recent x-ray studies have been reviewed in detail by the Attending Physician  Scheduled Meds:  Scheduled Meds: . amiodarone  200 mg Per Tube Daily  . amLODipine  10 mg Per Tube Daily  . antiseptic oral rinse  7 mL  Mouth Rinse QID  . chlorhexidine gluconate (SAGE KIT)  15 mL Mouth Rinse BID  . collagenase   Topical Daily  . collagenase   Topical Daily  . darbepoetin (ARANESP) injection - NON-DIALYSIS  60 mcg Subcutaneous Q Thu-1800  . free water  200 mL Per Tube Q4H  . insulin aspart  0-9 Units Subcutaneous TID WC  . metoprolol tartrate  12.5 mg Per Tube BID  . pantoprazole sodium  40 mg Per Tube BID  . valproic acid  125 mg Per Tube BID    Time spent on care of this patient: 35 mins   Aayansh Codispoti T , MD   [facility] Office  [phone] Pager - Text Page per Shea Evans as per below:  On-Call/Text Page:      [email or web address]      password [email or web address]  If 7PM-7AM, please contact night-coverage [email or web address] Password [email or web address] 10/06/2015, 2:43 PM   LOS: 15 days

## 2015-10-06 NOTE — Progress Notes (Signed)
Physical Therapy Wound Treatment Patient Details  Name: Jacqueline Fuentes MRN: [id number] Date of Birth: July 21, 1950  Today's Date: 10/06/2015 Time: 4163-8453 Time Calculation (min): 56 min  Subjective  Subjective: Pt nonverbal throughout session Patient and Family Stated Goals: Non stated Date of Onset:  (Pt admitted with sacral wound)  Pain Score:  Pt was premedicated prior to session.   Wound Assessment  Pressure Ulcer 09/21/15 Unstageable - Full thickness tissue loss in which the base of the ulcer is covered by slough (yellow, tan, gray, green or brown) and/or eschar (tan, brown or black) in the wound bed. (Active)  Dressing Type ABD;Gauze (Comment);Moist to dry;Barrier Film (skin prep) 10/06/2015 11:07 AM  Dressing Clean;Dry;Intact 10/06/2015 11:07 AM  Dressing Change Frequency Daily 10/06/2015 11:07 AM  State of Healing Eschar 10/06/2015 11:07 AM  Site / Wound Assessment Bleeding;Black;Brown;Pink;Red;Yellow 10/06/2015 11:07 AM  % Wound base Red or Granulating 65% 10/06/2015 11:07 AM  % Wound base Yellow 30% 10/06/2015 11:07 AM  % Wound base Black 5% 10/06/2015 11:07 AM  % Wound base Other (Comment) 100% 09/28/2015  5:35 PM  Peri-wound Assessment Intact;Induration 10/06/2015 11:07 AM  Wound Length (cm) 18 cm 10/02/2015  9:00 AM  Wound Width (cm) 17 cm 10/02/2015  9:00 AM  Wound Depth (cm) 4.1 cm 10/02/2015  9:00 AM  Tunneling (cm) 5.5 09/21/2015  5:00 AM  Undermining (cm) 4.3cm 7 o'clock to 10 o'clock; 3.5cm 11 o'clock to 3 o'clock, 3.1cm 3 o'clock to 6 o'clock 10/02/2015  9:00 AM  Margins Unattached edges (unapproximated) 10/06/2015 11:07 AM  Drainage Amount Moderate 10/06/2015 11:07 AM  Drainage Description Serosanguineous 10/06/2015 11:07 AM  Treatment Debridement (Selective);Hydrotherapy (Pulse lavage);Packing (Saline gauze) 10/06/2015 11:07 AM  Santyl applied to wound bed prior to applying dressing.   Hydrotherapy Pulsed lavage therapy - wound location: Sacrum Pulsed Lavage with Suction  (psi): 8 psi Pulsed Lavage with Suction - Normal Saline Used: 2000 mL Pulsed Lavage Tip: Tip with splash shield Selective Debridement Selective Debridement - Location: Sacrum Selective Debridement - Tools Used: Forceps;Scalpel Selective Debridement - Tissue Removed: Yellow and black/brown necrotic tissue   Wound Assessment and Plan  Wound Therapy - Assess/Plan/Recommendations Wound Therapy - Clinical Statement: Progressing with selective removal of non-viable tissue. Wound Therapy - Functional Problem List: Decreased tolerance of OOB and sitting position.  Factors Delaying/Impairing Wound Healing: Multiple medical problems;Immobility Hydrotherapy Plan: Debridement;Dressing change;Patient/family education;Pulsatile lavage with suction Wound Therapy - Frequency: 6X / week Wound Therapy - Follow Up Recommendations: Other (comment) (Per chart review plan is for return to [facility]) Wound Plan: See above  Wound Therapy Goals- Improve the function of patient's integumentary system by progressing the wound(s) through the phases of wound healing (inflammation - proliferation - remodeling) by: Decrease Necrotic Tissue to: 25% Decrease Necrotic Tissue - Progress: Progressing toward goal Increase Granulation Tissue to: 75% Increase Granulation Tissue - Progress: Progressing toward goal Goals/treatment plan/discharge plan were made with and agreed upon by patient/family: No, Patient unable to participate in goals/treatment/discharge plan and family unavailable Time For Goal Achievement: 7 days Wound Therapy - Potential for Goals: Fair  Goals will be updated until maximal potential achieved or discharge criteria met.  Discharge criteria: when goals achieved, discharge from hospital, MD decision/surgical intervention, no progress towards goals, refusal/missing three consecutive treatments without notification or medical reason.  GP     Rolinda Roan 10/06/2015, 11:12 AM   Rolinda Roan, PT,  DPT Acute Rehabilitation Services Pager: [phone]

## 2015-10-07 ENCOUNTER — Inpatient Hospital Stay (HOSPITAL_COMMUNITY): Payer: Medicare Other

## 2015-10-07 DIAGNOSIS — J189 Pneumonia, unspecified organism: Secondary | ICD-10-CM | POA: Diagnosis not present

## 2015-10-07 DIAGNOSIS — E870 Hyperosmolality and hypernatremia: Secondary | ICD-10-CM | POA: Diagnosis not present

## 2015-10-07 DIAGNOSIS — A419 Sepsis, unspecified organism: Secondary | ICD-10-CM | POA: Diagnosis not present

## 2015-10-07 DIAGNOSIS — D62 Acute posthemorrhagic anemia: Secondary | ICD-10-CM | POA: Diagnosis not present

## 2015-10-07 DIAGNOSIS — I7101 Dissection of thoracic aorta: Secondary | ICD-10-CM

## 2015-10-07 DIAGNOSIS — I482 Chronic atrial fibrillation: Secondary | ICD-10-CM | POA: Diagnosis not present

## 2015-10-07 LAB — GLUCOSE, CAPILLARY
GLUCOSE-CAPILLARY: 109 mg/dL — AB (ref 65–99)
GLUCOSE-CAPILLARY: 113 mg/dL — AB (ref 65–99)
GLUCOSE-CAPILLARY: 87 mg/dL (ref 65–99)
GLUCOSE-CAPILLARY: 97 mg/dL (ref 65–99)
GLUCOSE-CAPILLARY: 98 mg/dL (ref 65–99)
Glucose-Capillary: 101 mg/dL — ABNORMAL HIGH (ref 65–99)

## 2015-10-07 MED ORDER — FENTANYL CITRATE (PF) 100 MCG/2ML IJ SOLN
50.0000 ug | INTRAMUSCULAR | Status: DC | PRN
  Administered 2015-10-07 – 2015-10-08 (×6): 50 ug via INTRAVENOUS
  Filled 2015-10-07 (×6): qty 2

## 2015-10-07 MED ORDER — SODIUM CHLORIDE 0.9% FLUSH
10.0000 mL | INTRAVENOUS | Status: DC | PRN
  Administered 2015-10-07: 20 mL
  Administered 2015-10-09: 10 mL
  Filled 2015-10-07: qty 40

## 2015-10-07 MED ORDER — LEVALBUTEROL HCL 0.63 MG/3ML IN NEBU
0.6300 mg | INHALATION_SOLUTION | RESPIRATORY_TRACT | Status: DC | PRN
  Administered 2015-10-07 – 2015-10-09 (×3): 0.63 mg via RESPIRATORY_TRACT
  Filled 2015-10-07 (×3): qty 3

## 2015-10-07 MED ORDER — LABETALOL HCL 5 MG/ML IV SOLN
5.0000 mg | Freq: Four times a day (QID) | INTRAVENOUS | Status: DC
  Administered 2015-10-07 – 2015-10-09 (×7): 5 mg via INTRAVENOUS
  Filled 2015-10-07 (×7): qty 4

## 2015-10-07 NOTE — Progress Notes (Signed)
[facility] progress note. Chief complaint. Dissected thoracic aneurysm. History of present illness. This 65 year old female transferred from [facility] with quadriplegia, trach/vent/PEG dependent, seizure disorder, multiple CVA, etc. patient was admitted with anemia and hypotension requiring blood transfusion and was transferred from [facility] for that reason. Chest x-rays have noted a widened mediastinum and a noncontrast CT scan of the chest was ordered by the attending physician. CT scan resulted overnight showing a ascending thoracic aorta with severe dilation measuring 6.8 x 9.4 cm. A dissection could not be ruled out and a contrast enhanced CT angiogram of the chest was recommended. I discussed this with radiology considering her borderline renal function and they thought it would be reasonable to proceed. 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 No surrounding hemorrhage or evidence of pericardial fluid or blood. Physical exam. Vital signs. Temperature 97.8, pulse 76, respiration 24, blood pressure 119/74. O2 sats 98%. General appearance. Morbidly obese female who is somnolent and nonresponsive to tactile stimuli. Cardiac. Rate and rhythm regular. Lungs. Breath sounds reduced but clear. Abdomen. Soft and obese with positive bowel sounds. Impression/plan. Problem #1. Dissected thoracic aneurysm. It's unclear to me how aggressive the family would want to be in this case given comorbidity but all indications from the chart suggest family has wanted aggressive care to this point. I attempted to contact the patient's husband by home phone and cell phone but was unable to contact anyone. I did discuss the case with Dr. Roxan Hockey CVTS who has kindly agreed to see the patient in consult but not until later this a.m. I also  discussed the case with critical-care team and they saw no medical benefit in transferring the patient to ICU at this time. We'll leave update for attending to follow this urgent issue with the family. Patient appears clinically stable at this time.

## 2015-10-07 NOTE — Progress Notes (Signed)
[place] TEAM 1 - Stepdown/ICU TEAM PROGRESS NOTE  Jacqueline Fuentes [id number]:[id number] DOB: [birth year] DOA: 09/20/2015 PCP: Verneita Griffes, MD  Admit HPI / Brief Narrative: 65 year old F from [facility] w/ a PMHxQuadriplegia, Trach/Vent/PEG dependent, Seizure disorder, multiple CVAs, Atrial Fibrillation, Chronic Diastolic CHF CKD, Sacral Decubitus, Rt sided Pleural Effusion with Pleur-X in place, and Lt Hydronephrosis with Chronic Nephrostomy tube who was recently treated for funguria with diflucan, pseudomonas PNA with ertapenem, and sacral wound positive for MRSA. She was also started on cefepime and vancomycin, for what was reportedly a MRSA PNA. She had been requiring frequent blood transfusions for what was thought to be progression of her chronic anemia, however, she has multiple antibodies in her blood and is difficult to match. [facility] listed her as futility of care due to this anemia, however husband had reportedly been very reluctant to address goals of care.   On 3/15 Jacqueline Fuentes developed hypotension and hemoglobin was noted to be 4.2. She was transfused 1 unit PRBC, which was all that was available to [facility] at the time. Husband was contacted and requested a transfer to the emergency department so that she could receive further transfusions. In the emergency department she was noted to be febrile with leukocytosis. Hemoglobin was confirmed to be low at 5. Transfusions were ordered by ED staff.  HPI/Subjective: The patient is unresponsive and non-communicative.  There is no family present.  Events of last PM/CT results noted.    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)  Noted on CXR, dating back to Dec 2016 - CT chest confirms - TCTS was consulted and confirms pt is not a surgical candidate -  med tx is only option - attempted to inform husband but got no answer   Essential hypertension Blood pressure currently reasonably controlled - aim for even tighter control given aortic dissection - transition to IV labetalol to begin   Chronic Atrial Fibrillation Amiodarone + BB - heart rate controlled - not a candidate for anticoagulation due to recurring severe anemia  Chronic Diastolic CHF Echocardiogram confirms preserved systolic fxn and findings c/w only mild/modest diastolic dysfxn - no evidence of severe volume overload at present - ?accuracy of daily weights - follow  Filed Weights   10/05/15 0500 10/06/15 0352 10/07/15 0305  Weight: 90.719 kg (200 lb) 91.173 kg (201 lb) 91.173 kg (201 lb)   Acute on CKD (baseline Cr 0.97 06/30/15)  Lt hydronephrosis with nephrostomy tube (chronic) - Chronic indwelling foley catheter - Not a dialysis candidate - creatinine improving   Acute blood loss + anemia of chronic illness -Suspected low-grade ongoing loss related to hematuria+/- GIB -no further interventions available - anemia treated w/ blood transfusions x4 units this admit  -Hgb stable   Severe protein calorie malnutrition  -Continue tube feeds  Sepsis unspecified organism, source unclear consider -Multifactorial PNA, UTI, Sacral wound, or any number of chronic indwelling lines/tubes -Course of antibiotics completed and now off antibiotics -Afebrile with normal white blood cell count   Seizure disorder Continue usual medical therapy  Anxiety/depression Unable to assess as patient is obtunded   Multiple prior CVAs  Hypomagnesemia Corrected  Hypernatremia improving w/ free water   Osteomyelitis/Sacral Decubitus Ulcer unstageable Care as directed by WOC  Chronic pain  Code Status: FULL Family Communication: no family present at time of  exam - attempted to reach husband at [phone] but there was no answer Disposition Plan: SDU - was to return to [facility], but  [facility] now states they don't have an available bed - search for additional beds ongoing   Consultants: PCCM Palliative Care   Procedures: none  Antibiotics: Meropenem 3/16 > 3/22 Vancomycin 3/16 > 3/22  DVT prophylaxis: SCDs  Objective: Blood pressure 120/100, pulse 73, temperature 97.5 F (36.4 C), temperature source Oral, resp. rate 18, height '5\' 8"'  (1.727 m), weight 91.173 kg (201 lb), SpO2 100 %.  Intake/Output Summary (Last 24 hours) at 10/07/15 1437 Last data filed at 10/07/15 0600  Gross per 24 hour  Intake   1080 ml  Output   1600 ml  Net   -520 ml   Exam: General: No acute respiratory distress on vent  Lungs: breath sounds th/o all fields - no wheeze Cardiovascular: Regular rate without murmur  Abdomen: Nondistended, soft, no mass  Extremities: No significant cyanosis, clubbing, edema  B LE  Data Reviewed:  Basic Metabolic Panel:  Recent Labs Lab 10/01/15 1320 10/02/15 0500 10/03/15 0500 10/04/15 0600 10/06/15 0519  NA 158* 158* 154* 151* 146*  K 4.1 3.7 3.4* 3.9 3.5  CL 119* 118* 117* 113* 109  CO2 '29 30 31 31 31  ' GLUCOSE 110* 111* 116* 99 116*  BUN 84* 75* 72* 63* 53*  CREATININE 2.10* 1.93* 1.95* 1.83* 1.77*  CALCIUM 8.4* 8.1* 8.1* 8.3* 8.3*  MG 2.0 1.9 1.7  --   --     CBC:  Recent Labs Lab 10/01/15 0325 10/01/15 1320 10/02/15 0500 10/03/15 0500 10/04/15 0657  WBC 11.2* 12.5* 10.5 9.9 9.4  NEUTROABS 9.2* 10.2* 8.6*  --   --   HGB 7.4* 7.2* 7.0* 7.0* 7.3*  HCT 25.1* 23.7* 24.2* 22.6* 24.7*  MCV 88.4 88.1 89.3 88.6 86.7  PLT 142* 137* 137* 127* 129*    Liver Function Tests:  Recent Labs Lab 10/01/15 1320 10/02/15 0500  AST 12* 11*  ALT 8* 7*  ALKPHOS 60 60  BILITOT 0.8 0.4  PROT 7.2 7.0  ALBUMIN 1.4* 1.4*   CBG:  Recent Labs Lab 10/06/15 1615 10/06/15 2038 10/07/15 0019 10/07/15 0436 10/07/15 0831  GLUCAP 112* 96 87 97 109*    Recent Results (from the past 240 hour(s))  Culture, respiratory  (NON-Expectorated)     Status: None   Collection Time: 09/30/15 12:19 PM  Result Value Ref Range Status   Specimen Description TRACHEAL ASPIRATE  Final   Special Requests NONE  Final   Gram Stain   Final    FEW WBC PRESENT,BOTH PMN AND MONONUCLEAR RARE SQUAMOUS EPITHELIAL CELLS PRESENT ABUNDANT GRAM NEGATIVE RODS Performed at [facility]    Culture   Final    ABUNDANT PSEUDOMONAS AERUGINOSA Performed at [facility]    Report Status 10/03/2015 FINAL  Final   Organism ID, Bacteria PSEUDOMONAS AERUGINOSA  Final      Susceptibility   Pseudomonas aeruginosa - MIC*    CEFEPIME 2 SENSITIVE Sensitive     CEFTAZIDIME 2 SENSITIVE Sensitive     CIPROFLOXACIN <=0.25 SENSITIVE Sensitive     GENTAMICIN <=1 SENSITIVE Sensitive     IMIPENEM 2 SENSITIVE Sensitive     PIP/TAZO 8 SENSITIVE Sensitive     TOBRAMYCIN <=1 SENSITIVE Sensitive     * ABUNDANT PSEUDOMONAS AERUGINOSA     Studies:   Recent x-ray studies have been reviewed in detail by the Attending Physician  Scheduled Meds:  Scheduled Meds: . amiodarone  200 mg Per Tube Daily  . amLODipine  10 mg Per Tube Daily  . antiseptic oral rinse  7 mL Mouth Rinse QID  . chlorhexidine gluconate (SAGE KIT)  15 mL Mouth Rinse BID  . collagenase   Topical Daily  . collagenase   Topical Daily  . darbepoetin (ARANESP) injection - NON-DIALYSIS  60 mcg Subcutaneous Q Thu-1800  . free water  200 mL Per Tube Q4H  . insulin aspart  0-9 Units Subcutaneous TID WC  . metoprolol tartrate  12.5 mg Per Tube BID  . pantoprazole sodium  40 mg Per Tube BID  . valproic acid  125 mg Per Tube BID    Time spent on care of this patient: 35 mins   Analena Gama T , MD   [facility] Office  [phone] Pager - Text Page per Shea Evans as per below:  On-Call/Text Page:      [email or web address]      password [email or web address]  If 7PM-7AM, please contact night-coverage [email or web address] Password [email or web address] 10/07/2015, 2:37 PM   LOS: 16 days

## 2015-10-07 NOTE — Consult Note (Signed)
PULMONARY  / CRITICAL CARE MEDICINE CONSULTATION   Name: Jacqueline Fuentes MRN: [id number] DOB: [birth year]    ADMISSION DATE:  09/20/2015 CONSULTATION DATE: October 07, 2015  REQUESTING CLINICIAN: Cherene Altes, MD PRIMARY SERVICE: [facility]  CHIEF COMPLAINT:  (none)  HISTORY OF PRESENT ILLNESS:   Complex patient with many medical problems known to the PCCM group. Called by [facility] to evaluate candidacy for medical critical care given CT findings of ascending aortic dissection. [facility] had called thoracic surgery already, and had indicated that the patient would be seen in the morning.  PAST MEDICAL HISTORY :  Past Medical History  Diagnosis Date  . Ventilator dependent ([place])   . Atrial fibrillation ([place])   . Seizures ([place])   . Pleural effusion   . Renal disorder   . Stroke ([place])   . Quadriplegia, functional ([place])   . CHF (congestive heart failure) ([place])   . Hyperthyroidism   . Anemia   . GERD (gastroesophageal reflux disease)    Past Surgical History  Procedure Laterality Date  . Abdominal surgery     Prior to Admission medications   Medication Sig Start Date End Date Taking? Authorizing Provider  escitalopram (LEXAPRO) 20 MG tablet Take 20 mg by mouth daily.   Yes Historical Provider, MD  famotidine (PEPCID) 20 MG tablet 20 mg by PEG Tube route 2 (two) times daily.   Yes Historical Provider, MD  ferrous sulfate 325 (65 FE) MG tablet Take 325 mg by mouth daily with breakfast.   Yes Historical Provider, MD  hydrALAZINE (APRESOLINE) 25 MG tablet Take 75 mg by mouth 3 (three) times daily.   Yes Historical Provider, MD  HYDROcodone-acetaminophen (NORCO/VICODIN) 5-325 MG tablet Take 1 tablet by mouth every 6 (six) hours as needed for moderate pain.   Yes Historical Provider, MD  ipratropium-albuterol (DUONEB) 0.5-2.5 (3) MG/3ML SOLN Take 3 mLs by nebulization every 6 (six) hours.   Yes Historical Provider, MD  metoprolol tartrate (LOPRESSOR) 25 MG tablet Take 25 mg by mouth 3 (three)  times daily.   Yes Historical Provider, MD  traZODone (DESYREL) 50 MG tablet Take 50 mg by mouth at bedtime.   Yes Historical Provider, MD  Valproate Sodium (VALPROIC ACID) 250 MG/5ML SOLN Take 2.5 mLs by mouth 2 (two) times daily.   Yes Historical Provider, MD  ciprofloxacin (CIPRO) 500 MG tablet Take 1 tablet (500 mg total) by mouth every 12 (twelve) hours. 06/30/15   Forde Dandy, MD   Allergies  Allergen Reactions  . Aspirin   . Heparin   . Lorazepam     FAMILY HISTORY:  History reviewed. No pertinent family history. SOCIAL HISTORY:  reports that she has never smoked. She has never used smokeless tobacco. She reports that she does not drink alcohol. Her drug history is not on file.  REVIEW OF SYSTEMS:  Unable to obtain  SUBJECTIVE:   VITAL SIGNS: Temp:  [97.6 F (36.4 C)-98.1 F (36.7 C)] 97.8 F (36.6 C) (04/01 0006) Pulse Rate:  [63-88] 76 (04/01 0313) Resp:  [10-25] 24 (04/01 0313) BP: (107-140)/(71-84) 119/74 mmHg (04/01 0006) SpO2:  [92 %-100 %] 98 % (04/01 0313) FiO2 (%):  [40 %] 40 % (04/01 0313) Weight:  [201 lb (91.173 kg)] 201 lb (91.173 kg) (04/01 0305) HEMODYNAMICS:   VENTILATOR SETTINGS: Vent Mode:  [-] PCV FiO2 (%):  [40 %] 40 % Set Rate:  [18 bmp] 18 bmp PEEP:  [5 cmH20] 5 cmH20 Plateau Pressure:  [29 cmH20-30 cmH20] 30 cmH20  INTAKE / OUTPUT: Intake/Output      03/31 0701 - 04/01 0700   NG/GT 1810   Total Intake(mL/kg) 1810 (19.9)   Urine (mL/kg/hr) 1300 (0.6)   Total Output 1300   Net +510         PHYSICAL EXAMINATION: General:  Chronically-ill appearing woman, trached and peg'd Neuro:  Awake, but unable to verbal respond HEENT:  Trachin place Neck: no JVD Cardiovascular:  Distant heart sounds  Lungs:  CTA Abdomen:  Obese, PEg in place  LABS:  CBC  Recent Labs Lab 10/02/15 0500 10/03/15 0500 10/04/15 0657  WBC 10.5 9.9 9.4  HGB 7.0* 7.0* 7.3*  HCT 24.2* 22.6* 24.7*  PLT 137* 127* 129*   Coag's No results for input(s):  APTT, INR in the last 168 hours. BMET  Recent Labs Lab 10/03/15 0500 10/04/15 0600 10/06/15 0519  NA 154* 151* 146*  K 3.4* 3.9 3.5  CL 117* 113* 109  CO2 31 31 31   BUN 72* 63* 53*  CREATININE 1.95* 1.83* 1.77*  GLUCOSE 116* 99 116*   Electrolytes  Recent Labs Lab 10/01/15 1320 10/02/15 0500 10/03/15 0500 10/04/15 0600 10/06/15 0519  CALCIUM 8.4* 8.1* 8.1* 8.3* 8.3*  MG 2.0 1.9 1.7  --   --    Sepsis Markers  Recent Labs Lab 09/30/15 0905  LATICACIDVEN 0.7   ABG No results for input(s): PHART, PCO2ART, PO2ART in the last 168 hours. Liver Enzymes  Recent Labs Lab 09/30/15 0905 10/01/15 1320 10/02/15 0500  AST 11* 12* 11*  ALT 8* 8* 7*  ALKPHOS 66 60 60  BILITOT 0.6 0.8 0.4  ALBUMIN 1.4* 1.4* 1.4*   Cardiac Enzymes No results for input(s): TROPONINI, PROBNP in the last 168 hours. Glucose  Recent Labs Lab 10/06/15 0348 10/06/15 0800 10/06/15 1256 10/06/15 1615 10/06/15 2038 10/07/15 0019  GLUCAP 101* 105* 125* 112* 96 87    Imaging Ct Chest Wo Contrast  10/06/2015  ADDENDUM REPORT: 10/06/2015 20:36 ADDENDUM: These results were called by telephone at the time of interpretation on 10/06/2015 at 8:36 pm to Dr. Joette Catching , who verbally acknowledged these results. Electronically Signed   By: Marin Olp M.D.   On: 10/06/2015 20:36  10/06/2015  CLINICAL DATA:  Follow-up respiratory distress. EXAM: CT CHEST WITHOUT CONTRAST TECHNIQUE: Multidetector CT imaging of the chest was performed following the standard protocol without IV contrast. COMPARISON:  Chest x-ray 09/28/2015 FINDINGS: Tracheostomy tube appears in adequate position. Right-sided PICC line has tip in the SVC. Paraseptal emphysematous changes are present over the mid to upper lungs. There is consolidation over the posterior right upper lobe/ right middle lobe and posterior left upper lobe/lingula as well as posterior lung bases suggesting dependent atelectasis. Cannot completely exclude  infection. Small left effusion. Narrowing of the distal trachea and central bronchi due in part to compression by severe dilatation of the ascending aorta. Moderate cardiomegaly is present. There is calcified plaque over the left main and 3 vessel coronary arteries. There is severe dilatation of the ascending thoracic aorta extending to the arch as this measures approximately 6.8 x 9.4 cm in transverse in AP dimension over the mid ascending segment. Evaluation of this is limited due to lack of intravenous contrast. This may represent a large aneurysm versus dissection. No pericardial effusion. Remaining mediastinal structures are unremarkable. Heterogeneity to the thyroid gland. Images over the upper abdomen demonstrate surgical clips adjacent the proximal abdominal aorta. Mild diffuse subcutaneous edema. Moderate degenerative change of the spine. IMPRESSION: Severe dilatation of  the ascending thoracic aorta extending to the level of the arch measuring 6.8 x 9.4 cm in transverse in AP diameter. Evaluation limited due to lack of intravenous contrast as this may represent a large aneurysm versus dissection. No definite mediastinal hemorrhage or a pericardial fluid collection. Recommend contrast-enhanced CTA chest. Posterior dependent consolidation bilaterally likely atelectasis, although cannot exclude infection. Small left pleural effusion. Mild to moderate cardiomegaly. Electronically Signed: By: Marin Olp M.D. On: 10/06/2015 20:24   Ct Angio Chest Aorta W/cm &/or Wo/cm  10/07/2015  CLINICAL DATA:  Followup of aortic aneurysm. Chronic respiratory failure. Ventilator dependent. Functional quadriplegic. Congestive heart failure. EXAM: CT ANGIOGRAPHY CHEST WITH CONTRAST TECHNIQUE: Multidetector CT imaging of the chest was performed using the standard protocol during bolus administration of intravenous contrast. Multiplanar CT image reconstructions and MIPs were obtained to evaluate the vascular anatomy. CONTRAST:   56mL OMNIPAQUE IOHEXOL 350 MG/ML SOLN COMPARISON:  Nonenhanced CT of 1 day earlier. FINDINGS: Mediastinum/Nodes: Suboptimal contrast opacification secondary to difficulty with bolus trigger and decreased contrast dosage. A right-sided PICC line terminates at the mid SVC. Moderate cardiomegaly with multivessel coronary artery atherosclerosis. The aortic root is grossly normal. Centered in the mid ascending aorta is aneurysmal dilatation, including at 6.6 cm on image 69/ series 401. Dissection flap is identified, including on images 68 and 46/ series 401. Dissection/aneurysm continues into the transverse aorta, but excludes the descending segment. Maximal transverse dimension of the distal most ascending aorta at 7.6 x 8.6 cm on image 54. Involvement of the great vessels cannot be evaluated. No gross extension of the great vessels. No surrounding hemorrhage or evidence of pericardial hemorrhage. Tortuous descending thoracic aorta. Pulmonary artery enlargement, including a 4.2 cm outflow tract. No gross mediastinal adenopathy. Small hiatal hernia. Lungs/Pleura: Small left pleural effusion. Moderate degradation secondary to patient body habitus and motion. Endotracheal tube terminates just below the level of the clavicles. Paraseptal emphysema. Dependent right upper lobe pulmonary opacity is similar. Left lower lobe patchy airspace disease is not significantly changed. Upper abdomen: Degradation continuing into the upper abdomen. Grossly normal liver, spleen, pancreas, adrenal glands, kidneys. Incompletely imaged abdominal aortic aneurysm repair. Musculoskeletal: Moderate thoracic spondylosis. Review of the MIP images confirms the above findings. IMPRESSION: 1. Moderate to severely degraded exam, secondary to multiple factors including patient size, motion, and suboptimal contrast opacification. 2. Re- demonstration of ascending aortic aneurysm with dissection (Stanford A) involving the ascending and proximal  transverse segments. No surrounding hemorrhage or evidence of pericardial fluid or blood. 3. Left base airspace disease, suspicious for pneumonia. Small left pleural effusion. Right upper lobe opacity is favored to represent atelectasis. 4. Cardiomegaly with coronary artery atherosclerosis. 5. Pulmonary artery enlargement suggests pulmonary arterial hypertension. 6. Small hiatal hernia. These results will be called to the ordering clinician or representative by the Radiologist Assistant, and communication documented in the PACS or zVision Dashboard. Electronically Signed   By: Abigail Miyamoto M.D.   On: 10/07/2015 02:52   ASSESSMENT / PLAN:  Discussed at length with [facility] provider. At this time, she has normal and stable vital signs and there is no medical intervention that can be provided outside of what is presently being given in the stepdown unit. I agree with urgent surgical evaluation, although the chronicity of this dissection is not clear. Discussed with bedside RN; agree that this patient is a poor candidate for long-term aggressive care, but given current goals of care, PCCM does not have any critical care services that can be offered. The bedside RN  was agreeable to keeping the patient in her unit, and [facility] was agreeable to keep the patient as well.  Luz Brazen, MD Pulmonary and [place] Pager: [phone]   10/07/2015, 4:26 AM

## 2015-10-07 NOTE — Consult Note (Signed)
Reason for Consult:Type A dissection Referring Physician: Dr. Leonel Ramsay Jacqueline Fuentes is an 65 y.o. female.  HPI: CTSP for Type A dissection  History obtained from chart as patient unable to communicate and family not present.  65 yo woman transferred from [facility] on 09/20/15 with hypotension and anemia requiring transfusion. 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 Per palliative care note she has been vent dependent for about 8 months. She requires total care and is fed via a PEG tube. 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  Last night she had a CT chest to investigate a widened mediastinum noted on CXR. (has been present since December 2016, unclear why scan only done last night). An ascending aneurysm was noted. A repeat CT with contrast was a poor quality study but suggests a dissection flap.   Past Medical History  Diagnosis Date  . Ventilator dependent ([place])   . Atrial fibrillation ([place])   . Seizures ([place])   . Pleural effusion   . Renal disorder   . Stroke ([place])   . Quadriplegia, functional ([place])   . CHF (congestive heart failure) ([place])   . Hyperthyroidism   . Anemia   . GERD (gastroesophageal reflux disease)     Past Surgical History  Procedure Laterality Date  . Abdominal surgery      History reviewed. No pertinent family history.  Social History: No tobacco or alcohol per chart  Allergies:  Allergies  Allergen Reactions  . Aspirin   . Heparin   . Lorazepam     Medications:  Scheduled: . amiodarone  200 mg Per Tube Daily  . amLODipine  10 mg Per Tube Daily  . antiseptic oral rinse  7 mL Mouth Rinse QID  . chlorhexidine gluconate (SAGE KIT)  15 mL Mouth Rinse BID  . collagenase   Topical Daily  . collagenase   Topical Daily  . darbepoetin  (ARANESP) injection - NON-DIALYSIS  60 mcg Subcutaneous Q Thu-1800  . free water  200 mL Per Tube Q4H  . insulin aspart  0-9 Units Subcutaneous TID WC  . metoprolol tartrate  12.5 mg Per Tube BID  . pantoprazole sodium  40 mg Per Tube BID  . valproic acid  125 mg Per Tube BID    Results for orders placed or performed during the hospital encounter of 09/20/15 (from the past 48 hour(s))  Glucose, capillary     Status: None   Collection Time: 10/05/15  8:10 AM  Result Value Ref Range   Glucose-Capillary 89 65 - 99 mg/dL  Glucose, capillary     Status: None   Collection Time: 10/05/15 12:52 PM  Result Value Ref Range   Glucose-Capillary 86 65 - 99 mg/dL  Glucose, capillary     Status: Abnormal   Collection Time: 10/05/15  5:02 PM  Result Value Ref Range   Glucose-Capillary 106 (H) 65 - 99 mg/dL  Glucose, capillary     Status: None   Collection Time: 10/05/15  8:34 PM  Result Value Ref Range   Glucose-Capillary 97 65 - 99 mg/dL  Glucose, capillary     Status: Abnormal   Collection Time: 10/05/15 11:39 PM  Result Value Ref Range   Glucose-Capillary 102 (H) 65 - 99 mg/dL  Glucose, capillary     Status: Abnormal   Collection Time: 10/06/15  3:48 AM  Result Value Ref Range   Glucose-Capillary 101 (H) 65 - 99 mg/dL  Basic metabolic panel     Status: Abnormal   Collection Time: 10/06/15  5:19 AM  Result Value Ref Range   Sodium 146 (H) 135 - 145 mmol/L   Potassium 3.5 3.5 - 5.1 mmol/L   Chloride 109 101 - 111 mmol/L   CO2 31 22 - 32 mmol/L   Glucose, Bld 116 (H) 65 - 99 mg/dL   BUN 53 (H) 6 - 20 mg/dL   Creatinine, Ser 1.77 (H) 0.44 - 1.00 mg/dL   Calcium 8.3 (L) 8.9 - 10.3 mg/dL   GFR calc non Af Amer 29 (L) >60 mL/min   GFR calc Af Amer 34 (L) >60 mL/min    Comment: (NOTE) The eGFR has been calculated using the CKD EPI equation. This calculation has not been validated in all clinical situations. eGFR's persistently <60 mL/min signify possible Chronic Kidney Disease.     Anion gap 6 5 - 15  Brain natriuretic peptide     Status: Abnormal   Collection Time: 10/06/15  5:19 AM  Result Value Ref Range   B Natriuretic Peptide 524.2 (H) 0.0 - 100.0 pg/mL  Glucose, capillary     Status: Abnormal   Collection Time: 10/06/15  8:00 AM  Result Value Ref Range   Glucose-Capillary 105 (H) 65 - 99 mg/dL  Glucose, capillary     Status: Abnormal   Collection Time: 10/06/15 12:56 PM  Result Value Ref Range   Glucose-Capillary 125 (H) 65 - 99 mg/dL  Glucose, capillary     Status: Abnormal   Collection Time: 10/06/15  4:15 PM  Result Value Ref Range   Glucose-Capillary 112 (H) 65 - 99 mg/dL  Glucose, capillary     Status: None   Collection Time: 10/06/15  8:38 PM  Result Value Ref Range   Glucose-Capillary 96 65 - 99 mg/dL  Glucose, capillary     Status: None   Collection Time: 10/07/15 12:19 AM  Result Value Ref Range   Glucose-Capillary 87 65 - 99 mg/dL  Glucose, capillary     Status: None   Collection Time: 10/07/15  4:36 AM  Result Value Ref Range   Glucose-Capillary 97 65 - 99 mg/dL   Comment 1 Notify RN    Comment 2 Document in Chart     Ct Chest Wo Contrast  10/06/2015  ADDENDUM REPORT: 10/06/2015 20:36 ADDENDUM: These results were called by telephone at the time of interpretation on 10/06/2015 at 8:36 pm to Dr. Joette Catching , who verbally acknowledged these results. Electronically Signed   By: Marin Olp M.D.   On: 10/06/2015 20:36  10/06/2015  CLINICAL DATA:  Follow-up respiratory distress. EXAM: CT CHEST WITHOUT CONTRAST TECHNIQUE: Multidetector CT imaging of the chest was performed following the standard protocol without IV contrast. COMPARISON:  Chest x-ray 09/28/2015 FINDINGS: Tracheostomy tube appears in adequate position. Right-sided PICC line has tip in the SVC. Paraseptal emphysematous changes are present over the mid to upper lungs. There is consolidation over the posterior right upper lobe/ right middle lobe and posterior left upper  lobe/lingula as well as posterior lung bases suggesting dependent atelectasis. Cannot completely exclude infection. Small left effusion. Narrowing of the distal trachea and central bronchi due in part to compression by severe dilatation of the ascending aorta. Moderate cardiomegaly is present. There is calcified plaque over the left main and 3 vessel coronary arteries. There is severe dilatation of the ascending thoracic aorta extending to  the arch as this measures approximately 6.8 x 9.4 cm in transverse in AP dimension over the mid ascending segment. Evaluation of this is limited due to lack of intravenous contrast. This may represent a large aneurysm versus dissection. No pericardial effusion. Remaining mediastinal structures are unremarkable. Heterogeneity to the thyroid gland. Images over the upper abdomen demonstrate surgical clips adjacent the proximal abdominal aorta. Mild diffuse subcutaneous edema. Moderate degenerative change of the spine. IMPRESSION: Severe dilatation of the ascending thoracic aorta extending to the level of the arch measuring 6.8 x 9.4 cm in transverse in AP diameter. Evaluation limited due to lack of intravenous contrast as this may represent a large aneurysm versus dissection. No definite mediastinal hemorrhage or a pericardial fluid collection. Recommend contrast-enhanced CTA chest. Posterior dependent consolidation bilaterally likely atelectasis, although cannot exclude infection. Small left pleural effusion. Mild to moderate cardiomegaly. Electronically Signed: By: Marin Olp M.D. On: 10/06/2015 20:24   Ct Angio Chest Aorta W/cm &/or Wo/cm  10/07/2015  CLINICAL DATA:  Followup of aortic aneurysm. Chronic respiratory failure. Ventilator dependent. Functional quadriplegic. Congestive heart failure. EXAM: CT ANGIOGRAPHY CHEST WITH CONTRAST TECHNIQUE: Multidetector CT imaging of the chest was performed using the standard protocol during bolus administration of intravenous  contrast. Multiplanar CT image reconstructions and MIPs were obtained to evaluate the vascular anatomy. CONTRAST:  35m OMNIPAQUE IOHEXOL 350 MG/ML SOLN COMPARISON:  Nonenhanced CT of 1 day earlier. FINDINGS: Mediastinum/Nodes: Suboptimal contrast opacification secondary to difficulty with bolus trigger and decreased contrast dosage. A right-sided PICC line terminates at the mid SVC. Moderate cardiomegaly with multivessel coronary artery atherosclerosis. The aortic root is grossly normal. Centered in the mid ascending aorta is aneurysmal dilatation, including at 6.6 cm on image 69/ series 401. Dissection flap is identified, including on images 68 and 46/ series 401. Dissection/aneurysm continues into the transverse aorta, but excludes the descending segment. Maximal transverse dimension of the distal most ascending aorta at 7.6 x 8.6 cm on image 54. Involvement of the great vessels cannot be evaluated. No gross extension of the great vessels. No surrounding hemorrhage or evidence of pericardial hemorrhage. Tortuous descending thoracic aorta. Pulmonary artery enlargement, including a 4.2 cm outflow tract. No gross mediastinal adenopathy. Small hiatal hernia. Lungs/Pleura: Small left pleural effusion. Moderate degradation secondary to patient body habitus and motion. Endotracheal tube terminates just below the level of the clavicles. Paraseptal emphysema. Dependent right upper lobe pulmonary opacity is similar. Left lower lobe patchy airspace disease is not significantly changed. Upper abdomen: Degradation continuing into the upper abdomen. Grossly normal liver, spleen, pancreas, adrenal glands, kidneys. Incompletely imaged abdominal aortic aneurysm repair. Musculoskeletal: Moderate thoracic spondylosis. Review of the MIP images confirms the above findings. IMPRESSION: 1. Moderate to severely degraded exam, secondary to multiple factors including patient size, motion, and suboptimal contrast opacification. 2. 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 No surrounding hemorrhage or evidence of pericardial fluid or blood. 3. Left base airspace disease, suspicious for pneumonia. Small left pleural effusion. Right upper lobe opacity is favored to represent atelectasis. 4. Cardiomegaly with coronary artery atherosclerosis. 5. Pulmonary artery enlargement suggests pulmonary arterial hypertension. 6. Small hiatal hernia. These results will be called to the ordering clinician or representative by the Radiologist Assistant, and communication documented in the PACS or zVision Dashboard. Electronically Signed   By: KAbigail MiyamotoM.D.   On: 10/07/2015 02:52    Review of Systems  Unable to perform ROS: mental acuity   Blood  pressure 134/78, pulse 59, temperature 98.5 F (36.9 C), temperature source Oral, resp. rate 0, height _0  (1.727 m), weight 201 lb (91.173 kg), SpO2 100 %. Physical Exam  Vitals reviewed. Constitutional: No distress.  obese  HENT:  Head: Normocephalic and atraumatic.  Neck:  Tracheostomy in place  Cardiovascular: Normal rate and regular rhythm.   Murmur heard. Respiratory: She has wheezes.  Rhonchi bilaterally  GI: Soft. There is no tenderness.  PEG in place  Musculoskeletal: She exhibits edema.  Neurological:  Opens eyes, no movement to commands  Skin: Skin is warm and dry.    Assessment/Plan: 65 yo woman with a multitude of medical problems including multiple strokes, seizure disorder, hypertension, chronic atrial fibrillation, chronic ventilator dependent respiratory failure, malnutrition, sacral decubitus ulcer with lumbar spine osteomyelitis and anemia. She was transferred from [facility] due to anemia, hypotension, and need for blood transfusions on 3/15.   She had a CT last night which showed a large ascending aortic and arch aneurysm with a dissection. This may be the source of her initial  presentation 2 weeks ago. The aneurysm has been present for some time. Looking back at at her CXR from December she had a widened mediastinum at that time, so there is no telling when the dissection actually occurred.   Jacqueline Fuentes is NOT a candidate for surgery to replace her ascending aorta and arch. Her chances of survival are better, albeit slim, with appropriate medical therapy.  Melrose Nakayama 10/07/2015, 7:13 AM

## 2015-10-07 NOTE — Progress Notes (Signed)
Physical Therapy Wound Treatment Patient Details  Name: Jacqueline Fuentes MRN: [id number] Date of Birth: [birth year]  Today's Date: 10/07/2015 Time: 3358-2518 Time Calculation (min): 45 min  Subjective  Subjective: Pt nonverbal throughout session; however, indicating pain during treatment Patient and Family Stated Goals: Non stated  Pain Score:    Wound Assessment  Pressure Ulcer 09/21/15 Unstageable - Full thickness tissue loss in which the base of the ulcer is covered by slough (yellow, tan, gray, green or brown) and/or eschar (tan, brown or black) in the wound bed. (Active)  Dressing Type ABD;Gauze (Comment);Moist to dry 10/07/2015  9:27 AM  Dressing Clean;Dry;Intact 10/07/2015  9:27 AM  Dressing Change Frequency Daily 10/07/2015  9:27 AM  State of Healing Eschar 10/07/2015  9:27 AM  Site / Wound Assessment Granulation tissue;Pink;Red;Yellow;Brown 10/07/2015  9:27 AM  % Wound base Red or Granulating 65% 10/07/2015  9:27 AM  % Wound base Yellow 30% 10/07/2015  9:27 AM  % Wound base Black 5% 10/07/2015  9:27 AM  % Wound base Other (Comment) 0% 10/07/2015  9:27 AM  Peri-wound Assessment Intact;Induration 10/07/2015  9:27 AM  Margins Unattached edges (unapproximated) 10/07/2015  9:27 AM  Drainage Amount Moderate 10/07/2015  9:27 AM  Drainage Description Serosanguineous 10/07/2015  9:27 AM  Treatment Hydrotherapy (Pulse lavage);Packing (Saline gauze);Debridement (Selective) 10/07/2015  9:27 AM   Hydrotherapy Pulsed lavage therapy - wound location: Sacrum Pulsed Lavage with Suction (psi): 8 psi Pulsed Lavage with Suction - Normal Saline Used: 2000 mL Pulsed Lavage Tip: Tip with splash shield   Wound Assessment and Plan  Wound Therapy - Assess/Plan/Recommendations Wound Therapy - Clinical Statement: Progressing with selective removal of non-viable tissue. Wound Therapy - Functional Problem List: Decreased tolerance of OOB and sitting position.  Factors Delaying/Impairing Wound Healing: Multiple medical  problems;Immobility Hydrotherapy Plan: Debridement;Dressing change;Patient/family education;Pulsatile lavage with suction Wound Therapy - Frequency: 6X / week Wound Therapy - Follow Up Recommendations: Skilled nursing facility Wound Plan: See above  Wound Therapy Goals- Improve the function of patient's integumentary system by progressing the wound(s) through the phases of wound healing (inflammation - proliferation - remodeling) by: Decrease Necrotic Tissue to: 25% Decrease Necrotic Tissue - Progress: Progressing toward goal Increase Granulation Tissue to: 75% Increase Granulation Tissue - Progress: Progressing toward goal Goals/treatment plan/discharge plan were made with and agreed upon by patient/family: No, Patient unable to participate in goals/treatment/discharge plan and family unavailable  Goals will be updated until maximal potential achieved or discharge criteria met.  Discharge criteria: when goals achieved, discharge from hospital, MD decision/surgical intervention, no progress towards goals, refusal/missing three consecutive treatments without notification or medical reason.  GP     [name] 10/07/2015, 9:34 AM  10/07/2015 [name], [place]

## 2015-10-08 DIAGNOSIS — J9611 Chronic respiratory failure with hypoxia: Secondary | ICD-10-CM | POA: Diagnosis not present

## 2015-10-08 DIAGNOSIS — D62 Acute posthemorrhagic anemia: Secondary | ICD-10-CM | POA: Diagnosis not present

## 2015-10-08 DIAGNOSIS — N179 Acute kidney failure, unspecified: Secondary | ICD-10-CM | POA: Diagnosis not present

## 2015-10-08 DIAGNOSIS — F411 Generalized anxiety disorder: Secondary | ICD-10-CM

## 2015-10-08 DIAGNOSIS — A419 Sepsis, unspecified organism: Secondary | ICD-10-CM | POA: Diagnosis not present

## 2015-10-08 DIAGNOSIS — J189 Pneumonia, unspecified organism: Secondary | ICD-10-CM | POA: Diagnosis not present

## 2015-10-08 LAB — GLUCOSE, CAPILLARY
GLUCOSE-CAPILLARY: 84 mg/dL (ref 65–99)
GLUCOSE-CAPILLARY: 94 mg/dL (ref 65–99)
GLUCOSE-CAPILLARY: 95 mg/dL (ref 65–99)
Glucose-Capillary: 102 mg/dL — ABNORMAL HIGH (ref 65–99)
Glucose-Capillary: 107 mg/dL — ABNORMAL HIGH (ref 65–99)
Glucose-Capillary: 93 mg/dL (ref 65–99)

## 2015-10-08 MED ORDER — DIAZEPAM 5 MG/ML IJ SOLN
2.5000 mg | INTRAMUSCULAR | Status: DC | PRN
  Administered 2015-10-08 – 2015-10-09 (×3): 2.5 mg via INTRAVENOUS
  Filled 2015-10-08 (×3): qty 2

## 2015-10-08 MED ORDER — OXYCODONE HCL 5 MG/5ML PO SOLN: 5.0000 mg | ORAL | Status: DC | PRN

## 2015-10-08 MED ORDER — ACETAMINOPHEN 160 MG/5ML PO SOLN: 650.0000 mg | ORAL | Status: DC | PRN

## 2015-10-08 MED ORDER — FENTANYL CITRATE (PF) 100 MCG/2ML IJ SOLN
50.0000 ug | INTRAMUSCULAR | Status: DC | PRN
  Administered 2015-10-08 – 2015-10-10 (×11): 75 ug via INTRAVENOUS
  Administered 2015-10-10: 50 ug via INTRAVENOUS
  Filled 2015-10-08 (×12): qty 2

## 2015-10-08 MED ORDER — FREE WATER
200.0000 mL | Freq: Three times a day (TID) | Status: DC
Start: 2015-10-08 — End: 2015-10-10
  Administered 2015-10-08 – 2015-10-10 (×5): 200 mL

## 2015-10-08 NOTE — Progress Notes (Signed)
Central TEAM 1 - Stepdown/ICU TEAM PROGRESS NOTE  Jacqueline Fuentes WIO:[id number] DOB: Aug 17, 1950 DOA: 09/20/2015 PCP: Verneita Griffes, MD  Admit HPI / Brief Narrative: 65 year old F from [facility] w/ a PMHxQuadriplegia, Trach/Vent/PEG dependent, Seizure disorder, multiple CVAs, Atrial Fibrillation, Chronic Diastolic CHF CKD, Sacral Decubitus, Rt sided Pleural Effusion with Pleur-X in place, and Lt Hydronephrosis with Chronic Nephrostomy tube who was recently treated for funguria with diflucan, pseudomonas PNA with ertapenem, and sacral wound positive for MRSA. She was also started on cefepime and vancomycin, for what was reportedly a MRSA PNA. She had been requiring frequent blood transfusions for what was thought to be progression of her chronic anemia, however, she has multiple antibodies in her blood and is difficult to match. [facility] listed her as futility of care due to this anemia, however husband had reportedly been very reluctant to address goals of care.   On 3/15 Jacqueline Fuentes developed hypotension and hemoglobin was noted to be 4.2. She was transfused 1 unit PRBC, which was all that was available to [facility] at the time. Husband was contacted and requested a transfer to the emergency department so that she could receive further transfusions. In the emergency department she was noted to be febrile with leukocytosis. Hemoglobin was confirmed to be low at 5. Transfusions were ordered by ED staff.  HPI/Subjective: The patient is non-communicative.  There is no family present.     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)  Noted on CXR, dating back to Dec 2016 - CT chest confirmed - TCTS was consulted and confirmed pt is not a surgical candidate - med tx  is only option - attempted to inform husband but got no answer - cont to adjust BP meds    Essential hypertension Blood pressure currently controlled but will still aim for even tighter control given aortic dissection   Chronic Atrial Fibrillation Amiodarone + BB - heart rate controlled - not a candidate for anticoagulation due to recurring severe anemia  Chronic Diastolic CHF Echocardiogram confirms preserved systolic fxn and findings c/w only mild/modest diastolic dysfxn - no evidence of severe volume overload at present - ?accuracy of daily weights - follow  Filed Weights   10/06/15 0352 10/07/15 0305 10/08/15 0422  Weight: 91.173 kg (201 lb) 91.173 kg (201 lb) 92.08 kg (203 lb)   Acute on CKD (baseline Cr 0.97 06/30/15)  Lt hydronephrosis with nephrostomy tube (chronic) - Chronic indwelling foley catheter - not a dialysis candidate - creatinine improving   Recent Labs Lab 10/01/15 1320 10/02/15 0500 10/03/15 0500 10/04/15 0600 10/06/15 0519  CREATININE 2.10* 1.93* 1.95* 1.83* 1.77*   Acute blood loss + anemia of chronic illness -Suspected low-grade ongoing loss related to hematuria+/- GIB -no further interventions available - anemia treated w/ blood transfusions x4 units this admit  -Hgb stable   Recent Labs Lab 10/01/15 1320 10/02/15 0500 10/03/15 0500 10/04/15 0657  HGB 7.2* 7.0* 7.0* 7.3*   Severe protein calorie malnutrition  -Continue tube feeds  Sepsis unspecified organism, source unclear consider -Multifactorial PNA, UTI, Sacral wound, or any number of chronic indwelling lines/tubes -Course of antibiotics completed and now off antibiotics -Afebrile with normal white blood cell count   Seizure disorder Continue usual medical therapy  Anxiety/depression RN notes signs  of agitation/anxiety intermittently th/o day - will adjust tx and follow   Multiple prior CVAs  Hypomagnesemia Corrected  Hypernatremia improving w/ free water    Osteomyelitis/Sacral Decubitus Ulcer unstageable - R ankle wound Care as directed by WOC  Chronic pain  Edema Pt developing intermittent swelling of arms/hands - this is likely due to low albumin, but could also be sign of UE DVT - nonetheless, the pt is not a candidate for anticaog given recurrent life threatening anemia, therefore I will not order doppler evals   Code Status: FULL Family Communication: no family present at time of exam Disposition Plan: awaiting vent SNF placement  Consultants: PCCM Palliative Care   Procedures: none  Antibiotics: Meropenem 3/16 > 3/22 Vancomycin 3/16 > 3/22  DVT prophylaxis: SCDs  Objective: Blood pressure 135/76, pulse 85, temperature 98.1 F (36.7 C), temperature source Oral, resp. rate 20, height '5\' 8"'  (1.727 m), weight 92.08 kg (203 lb), SpO2 100 %.  Intake/Output Summary (Last 24 hours) at 10/08/15 1137 Last data filed at 10/08/15 0600  Gross per 24 hour  Intake   1920 ml  Output   1600 ml  Net    320 ml   Exam: General: No acute distress on vent  Lungs: distant breath sounds th/o all fields - no wheeze Cardiovascular: Regular rate without murmur or rub  Abdomen: nondistended, soft, no mass  Extremities: No significant cyanosis, or clubbing - 2+ B UE edema   Data Reviewed:  Basic Metabolic Panel:  Recent Labs Lab 10/01/15 1320 10/02/15 0500 10/03/15 0500 10/04/15 0600 10/06/15 0519  NA 158* 158* 154* 151* 146*  K 4.1 3.7 3.4* 3.9 3.5  CL 119* 118* 117* 113* 109  CO2 '29 30 31 31 31  ' GLUCOSE 110* 111* 116* 99 116*  BUN 84* 75* 72* 63* 53*  CREATININE 2.10* 1.93* 1.95* 1.83* 1.77*  CALCIUM 8.4* 8.1* 8.1* 8.3* 8.3*  MG 2.0 1.9 1.7  --   --     CBC:  Recent Labs Lab 10/01/15 1320 10/02/15 0500 10/03/15 0500 10/04/15 0657  WBC 12.5* 10.5 9.9 9.4  NEUTROABS 10.2* 8.6*  --   --   HGB 7.2* 7.0* 7.0* 7.3*  HCT 23.7* 24.2* 22.6* 24.7*  MCV 88.1 89.3 88.6 86.7  PLT 137* 137* 127* 129*    Liver Function  Tests:  Recent Labs Lab 10/01/15 1320 10/02/15 0500  AST 12* 11*  ALT 8* 7*  ALKPHOS 60 60  BILITOT 0.8 0.4  PROT 7.2 7.0  ALBUMIN 1.4* 1.4*   CBG:  Recent Labs Lab 10/07/15 1604 10/07/15 1958 10/07/15 2353 10/08/15 0419 10/08/15 0814  GLUCAP 98 101* 107* 84 94    Recent Results (from the past 240 hour(s))  Culture, respiratory (NON-Expectorated)     Status: None   Collection Time: 09/30/15 12:19 PM  Result Value Ref Range Status   Specimen Description TRACHEAL ASPIRATE  Final   Special Requests NONE  Final   Gram Stain   Final    FEW WBC PRESENT,BOTH PMN AND MONONUCLEAR RARE SQUAMOUS EPITHELIAL CELLS PRESENT ABUNDANT GRAM NEGATIVE RODS Performed at [facility]    Culture   Final    ABUNDANT PSEUDOMONAS AERUGINOSA Performed at [facility]    Report Status 10/03/2015 FINAL  Final   Organism ID, Bacteria PSEUDOMONAS AERUGINOSA  Final      Susceptibility   Pseudomonas aeruginosa - MIC*    CEFEPIME 2 SENSITIVE Sensitive     CEFTAZIDIME 2 SENSITIVE Sensitive  CIPROFLOXACIN <=0.25 SENSITIVE Sensitive     GENTAMICIN <=1 SENSITIVE Sensitive     IMIPENEM 2 SENSITIVE Sensitive     PIP/TAZO 8 SENSITIVE Sensitive     TOBRAMYCIN <=1 SENSITIVE Sensitive     * ABUNDANT PSEUDOMONAS AERUGINOSA     Studies:   Recent x-ray studies have been reviewed in detail by the Attending Physician  Scheduled Meds:  Scheduled Meds: . amiodarone  200 mg Per Tube Daily  . amLODipine  10 mg Per Tube Daily  . antiseptic oral rinse  7 mL Mouth Rinse QID  . chlorhexidine gluconate (SAGE KIT)  15 mL Mouth Rinse BID  . collagenase   Topical Daily  . collagenase   Topical Daily  . darbepoetin (ARANESP) injection - NON-DIALYSIS  60 mcg Subcutaneous Q Thu-1800  . free water  200 mL Per Tube Q4H  . insulin aspart  0-9 Units Subcutaneous TID WC  . labetalol  5 mg Intravenous 4 times per day  . pantoprazole sodium  40 mg Per Tube BID  . valproic acid  125 mg Per Tube  BID    Time spent on care of this patient: 25 mins   [facility] T , MD   [facility] Office  [phone] Pager - Text Page per Shea Evans as per below:  On-Call/Text Page:      [email or web address]      password [email or web address]  If 7PM-7AM, please contact night-coverage [email or web address] Password [email or web address] 10/08/2015, 11:37 AM   LOS: 17 days

## 2015-10-09 DIAGNOSIS — D62 Acute posthemorrhagic anemia: Secondary | ICD-10-CM | POA: Diagnosis not present

## 2015-10-09 DIAGNOSIS — I482 Chronic atrial fibrillation: Secondary | ICD-10-CM | POA: Diagnosis not present

## 2015-10-09 DIAGNOSIS — A419 Sepsis, unspecified organism: Secondary | ICD-10-CM | POA: Diagnosis not present

## 2015-10-09 DIAGNOSIS — I5033 Acute on chronic diastolic (congestive) heart failure: Secondary | ICD-10-CM

## 2015-10-09 DIAGNOSIS — J189 Pneumonia, unspecified organism: Secondary | ICD-10-CM | POA: Diagnosis not present

## 2015-10-09 DIAGNOSIS — N179 Acute kidney failure, unspecified: Secondary | ICD-10-CM | POA: Diagnosis not present

## 2015-10-09 LAB — CBC
HCT: 22.3 % — ABNORMAL LOW (ref 36.0–46.0)
Hemoglobin: 6.7 g/dL — CL (ref 12.0–15.0)
MCH: 26 pg (ref 26.0–34.0)
MCHC: 30 g/dL (ref 30.0–36.0)
MCV: 86.4 fL (ref 78.0–100.0)
Platelets: 134 10*3/uL — ABNORMAL LOW (ref 150–400)
RBC: 2.58 MIL/uL — ABNORMAL LOW (ref 3.87–5.11)
RDW: 19.8 % — AB (ref 11.5–15.5)
WBC: 8.9 10*3/uL (ref 4.0–10.5)

## 2015-10-09 LAB — BASIC METABOLIC PANEL
Anion gap: 7 (ref 5–15)
BUN: 43 mg/dL — AB (ref 6–20)
CALCIUM: 8.3 mg/dL — AB (ref 8.9–10.3)
CHLORIDE: 109 mmol/L (ref 101–111)
CO2: 33 mmol/L — AB (ref 22–32)
CREATININE: 1.65 mg/dL — AB (ref 0.44–1.00)
GFR calc non Af Amer: 32 mL/min — ABNORMAL LOW (ref >60)
GFR, EST AFRICAN AMERICAN: 37 mL/min — AB (ref >60)
Glucose, Bld: 113 mg/dL — ABNORMAL HIGH (ref 65–99)
Potassium: 3.5 mmol/L (ref 3.5–5.1)
SODIUM: 149 mmol/L — AB (ref 135–145)

## 2015-10-09 LAB — GLUCOSE, CAPILLARY
GLUCOSE-CAPILLARY: 100 mg/dL — AB (ref 65–99)
GLUCOSE-CAPILLARY: 96 mg/dL (ref 65–99)
GLUCOSE-CAPILLARY: 97 mg/dL (ref 65–99)
GLUCOSE-CAPILLARY: 97 mg/dL (ref 65–99)
Glucose-Capillary: 101 mg/dL — ABNORMAL HIGH (ref 65–99)
Glucose-Capillary: 100 mg/dL — ABNORMAL HIGH (ref 65–99)

## 2015-10-09 LAB — PREPARE RBC (CROSSMATCH)

## 2015-10-09 MED ORDER — SODIUM CHLORIDE 0.9 % IV SOLN
510.0000 mg | Freq: Once | INTRAVENOUS | Status: AC
  Administered 2015-10-09: 510 mg via INTRAVENOUS
  Filled 2015-10-09: qty 17

## 2015-10-09 MED ORDER — SODIUM CHLORIDE 0.9 % IV SOLN
510.0000 mg | Freq: Once | INTRAVENOUS | Status: DC
  Filled 2015-10-09: qty 17

## 2015-10-09 MED ORDER — LABETALOL HCL 200 MG PO TABS
100.0000 mg | ORAL_TABLET | Freq: Three times a day (TID) | ORAL | Status: DC
  Administered 2015-10-09 – 2015-10-10 (×4): 100 mg
  Filled 2015-10-09 (×4): qty 1

## 2015-10-09 MED ORDER — SODIUM CHLORIDE 0.9 % IV SOLN: Freq: Once | INTRAVENOUS | Status: DC

## 2015-10-09 MED ORDER — FUROSEMIDE 10 MG/ML IJ SOLN
40.0000 mg | Freq: Once | INTRAMUSCULAR | Status: AC
  Administered 2015-10-09: 40 mg via INTRAVENOUS
  Filled 2015-10-09: qty 4

## 2015-10-09 NOTE — Progress Notes (Signed)
MD notified of thick green mucous suctioned twice from patient this AM. Patient not in distress. Will continue to monitor patient closely.

## 2015-10-09 NOTE — Progress Notes (Signed)
[facility] will be able to accept pt back to their facility tomorrow (4/4) and continue hydrotherapy there.  CSW left message for husband and was able to talk to dtr to update.  Domenica Reamer, [place] Social Worker [phone]

## 2015-10-09 NOTE — Care Management Important Message (Signed)
Important Message  Patient Details  Name: Jacqueline Fuentes MRN: [id number] Date of Birth: September 22, 1950   Medicare Important Message Given:  Yes    Nathen May 10/09/2015, 12:10 PM

## 2015-10-09 NOTE — Progress Notes (Addendum)
[place] TEAM 1 - Stepdown/ICU TEAM PROGRESS NOTE  ANNE-MARIE GENSON WIO:[id number] DOB: 1951-05-07 DOA: 09/20/2015 PCP: Verneita Griffes, MD  Admit HPI / Brief Narrative: 65 year old F from [facility] w/ a PMHxQuadriplegia, Trach/Vent/PEG dependent, Seizure disorder, multiple CVAs, Atrial Fibrillation, Chronic Diastolic CHF CKD, Sacral Decubitus, Rt sided Pleural Effusion with Pleur-X in place, and Lt Hydronephrosis with Chronic Nephrostomy tube who was recently treated for funguria with diflucan, pseudomonas PNA with ertapenem, and sacral wound positive for MRSA. She was also started on cefepime and vancomycin, for what was reportedly a MRSA PNA. She had been requiring frequent blood transfusions for what was thought to be progression of her chronic anemia, however, she has multiple antibodies in her blood and is difficult to match. [facility] listed her as futility of care due to this anemia, however husband had reportedly been very reluctant to address goals of care.   On 3/15 Mrs. Brickner developed hypotension and hemoglobin was noted to be 4.2. She was transfused 1 unit PRBC, which was all that was available to [facility] at the time. Husband was contacted and requested a transfer to the emergency department so that she could receive further transfusions. In the emergency department she was noted to be febrile with leukocytosis. Hemoglobin was confirmed to be low at 5. Transfusions were ordered by ED staff.  HPI/Subjective: The patient is non-communicative.  There is no family present.     Assessment/Plan:  Chronic respiratory failure with ventilator dependence vent management per PCCM - no evidence of vent difficulty at this time   Recurrent Right effusion with PleurX CT w/o evidence of signif effusion at this time - no resp distress  Ascending Aortic Aneusrym w/ dissection ([place] A)  Noted on CXR, dating back to Dec 2016 - CT chest confirmed - TCTS was consulted and confirmed pt is not a surgical  candidate - med tx is only option - attempted to inform husband but got no answer - cont to adjust BP meds    Essential hypertension Blood pressure not at target range - adjust tx further and follow   Chronic Atrial Fibrillation Amiodarone + BB - heart rate controlled - not a candidate for anticoagulation due to recurring severe anemia  Mild Chronic Diastolic CHF Echocardiogram confirms preserved systolic fxn and findings c/w only mild/modest diastolic dysfxn - no evidence of severe volume overload at present - follow  Filed Weights   10/07/15 0305 10/08/15 0422 10/09/15 0500  Weight: 91.173 kg (201 lb) 92.08 kg (203 lb) 93.895 kg (207 lb)   Acute on CKD (baseline Cr 0.97 06/30/15)  Lt hydronephrosis with nephrostomy tube (chronic) - Chronic indwelling foley catheter - not a dialysis candidate - creatinine improving   Recent Labs Lab 10/03/15 0500 10/04/15 0600 10/06/15 0519 10/09/15 0410  CREATININE 1.95* 1.83* 1.77* 1.65*   Acute blood loss + anemia of chronic illness -Suspected low-grade ongoing loss related to hematuria+/- GIB  -also may be a component of hemolysis related to large chronic thoracic aortic dissection  -Hgb trending down again - transfuse one additional unit today  -no further interventions available - anemia treated w/ blood transfusions x5 units this admit  -load w/ IV Fe 10/09/2015  Recent Labs Lab 10/03/15 0500 10/04/15 0657 10/09/15 0410  HGB 7.0* 7.3* 6.7*   Severe protein calorie malnutrition  -Continue tube feeds  Sepsis unspecified organism, source unclear consider -Multifactorial PNA, UTI, Sacral wound, or any number of chronic indwelling lines/tubes -Course of antibiotics completed and now off antibiotics -Afebrile  with normal white blood cell count   Seizure disorder Continue usual medical therapy  Anxiety/depression Appears comfortable at time of my visit today   Multiple prior CVAs  Hypomagnesemia Corrected  Hypernatremia Had  been improving w/ free water - follow   Osteomyelitis/Sacral Decubitus Ulcer unstageable - R ankle wound Care as directed by WOC  Chronic pain  Edema Pt developing intermittent swelling of arms/hands - this is likely due to low albumin, but could also be sign of UE DVT - nonetheless, the pt is not a candidate for anticaog given recurrent life threatening anemia, therefore I will not order doppler evals   Code Status: FULL Family Communication: no family present at time of exam Disposition Plan: awaiting vent SNF placement  Consultants: PCCM Palliative Care   Procedures: none  Antibiotics: Meropenem 3/16 > 3/22 Vancomycin 3/16 > 3/22  DVT prophylaxis: SCDs  Objective: Blood pressure 145/79, pulse 92, temperature 98.8 F (37.1 C), temperature source Axillary, resp. rate 0, height _0  (1.727 m), weight 93.895 kg (207 lb), SpO2 100 %.  Intake/Output Summary (Last 24 hours) at 10/09/15 0926 Last data filed at 10/09/15 0700  Gross per 24 hour  Intake   1575 ml  Output   1325 ml  Net    250 ml   Exam: General: No acute distress on vent - no signif trach secretions at present  Lungs: distant breath sounds th/o all fields - no wheeze Cardiovascular: Regular rate without murmur or rub  Abdomen: nondistended, soft, no mass - PEG insertion clean/dry   Extremities: No significant cyanosis, or clubbing - 2+ B UE edema - 1+ B LE edema   Data Reviewed:  Basic Metabolic Panel:  Recent Labs Lab 10/03/15 0500 10/04/15 0600 10/06/15 0519 10/09/15 0410  NA 154* 151* 146* 149*  K 3.4* 3.9 3.5 3.5  CL 117* 113* 109 109  CO2 _1 33*  GLUCOSE 116* 99 116* 113*  BUN 72* 63* 53* 43*  CREATININE 1.95* 1.83* 1.77* 1.65*  CALCIUM 8.1* 8.3* 8.3* 8.3*  MG 1.7  --   --   --     CBC:  Recent Labs Lab 10/03/15 0500 10/04/15 0657 10/09/15 0410  WBC 9.9 9.4 8.9  HGB 7.0* 7.3* 6.7*  HCT 22.6* 24.7* 22.3*  MCV 88.6 86.7 86.4  PLT 127* 129* 134*   Liver Function  Tests: No results for input(s): AST, ALT, ALKPHOS, BILITOT, PROT, ALBUMIN in the last 168 hours.   CBG:  Recent Labs Lab 10/08/15 1214 10/08/15 1636 10/08/15 2038 10/08/15 2355 10/09/15 0351  GLUCAP 102* 93 95 97 101*    Recent Results (from the past 240 hour(s))  Culture, respiratory (NON-Expectorated)     Status: None   Collection Time: 09/30/15 12:19 PM  Result Value Ref Range Status   Specimen Description TRACHEAL ASPIRATE  Final   Special Requests NONE  Final   Gram Stain   Final    FEW WBC PRESENT,BOTH PMN AND MONONUCLEAR RARE SQUAMOUS EPITHELIAL CELLS PRESENT ABUNDANT GRAM NEGATIVE RODS Performed at [facility]    Culture   Final    ABUNDANT PSEUDOMONAS AERUGINOSA Performed at [facility]    Report Status 10/03/2015 FINAL  Final   Organism ID, Bacteria PSEUDOMONAS AERUGINOSA  Final      Susceptibility   Pseudomonas aeruginosa - MIC*    CEFEPIME 2 SENSITIVE Sensitive     CEFTAZIDIME 2 SENSITIVE Sensitive     CIPROFLOXACIN <=0.25 SENSITIVE Sensitive     GENTAMICIN <=  1 SENSITIVE Sensitive     IMIPENEM 2 SENSITIVE Sensitive     PIP/TAZO 8 SENSITIVE Sensitive     TOBRAMYCIN <=1 SENSITIVE Sensitive     * ABUNDANT PSEUDOMONAS AERUGINOSA     Studies:   Recent x-ray studies have been reviewed in detail by the Attending Physician  Scheduled Meds:  Scheduled Meds: . sodium chloride   Intravenous Once  . amiodarone  200 mg Per Tube Daily  . amLODipine  10 mg Per Tube Daily  . antiseptic oral rinse  7 mL Mouth Rinse QID  . chlorhexidine gluconate (SAGE KIT)  15 mL Mouth Rinse BID  . collagenase   Topical Daily  . collagenase   Topical Daily  . darbepoetin (ARANESP) injection - NON-DIALYSIS  60 mcg Subcutaneous Q Thu-1800  . free water  200 mL Per Tube 3 times per day  . insulin aspart  0-9 Units Subcutaneous TID WC  . labetalol  5 mg Intravenous 4 times per day  . pantoprazole sodium  40 mg Per Tube BID  . valproic acid  125 mg Per Tube BID     Time spent on care of this patient: 25 mins   [facility] T , MD   [facility] Office  [phone] Pager - Text Page per Shea Evans as per below:  On-Call/Text Page:      [email or web address]      password [email or web address]  If 7PM-7AM, please contact night-coverage [email or web address] Password [email or web address] 10/09/2015, 9:26 AM   LOS: 18 days

## 2015-10-09 NOTE — Progress Notes (Addendum)
Nutrition Follow Up  DOCUMENTATION CODES:   Obesity unspecified  INTERVENTION:    Continue Vital AF 1.2 formula via PEG tube at goal rate of 55 ml/hr to provide 1584 kcal, 99 grams of protein, 1059m of free water  NUTRITION DIAGNOSIS:   Inadequate oral intake related to inability to eat as evidenced by NPO status, ongoing  GOAL:   Patient will meet greater than or equal to 90% of their needs, met  MONITOR:   Vent status, Labs, Weight trends, TF tolerance, Skin, I & O's  ASSESSMENT:   Pt from [facility] who is trach/vent/PEG dependent at baseline. She has complicated past medical history as below, which includes seizure disorder, multiple CVAs, atrial fibrillation, chronic kidney disease, sacral decubitus, R sided pleural effusion with Pleur-X in place, diastolic CHF, and L hydronephrosis with chronic nephrostomy tube.   Patient is currently on ventilator support -- trach MV: 7.4 L/min Temp (24hrs), Avg:98.4 F (36.9 C), Min:98.2 F (36.8 C), Max:98.8 F (37.1 C)    Free water flushes at 200 ml every 8 hours.  Vital AF 1.2 formula currently infusing at goal rate of 55 ml/hr via PEG tube providing 1584 kcal, 99 grams of protein, 10763mof free water.  Tolerating well.  Disposition: awaiting SNF placement.   Diet Order:  Diet NPO time specified  Skin:   Unstageable pressure injury to right inner heel  Right outer foot with dark purple-red deep tissue injury Inner ankle with partial thickness wound Right posterior leg with full thickness wound Right posterior calf with unstageable pressure injury Sacrum with stage 4 pressure injury  Last BM:  4/3  Height:   Ht Readings from Last 1 Encounters:  09/21/15 '5\' 8"'  (1.727 m)    Weight:   Wt Readings from Last 1 Encounters:  10/09/15 207 lb (93.895 kg)    Ideal Body Weight:  55.6 kg (-12.5% for quadriplegia)  BMI:  Body mass index is 31.48 kg/(m^2).  Estimated Nutritional Needs:   Kcal:  162820Protein:   85-100 gm  Fluid:  >/= 1.5 L  EDUCATION NEEDS:   No education needs identified at this time  KaArthur HolmsRD, LDN Pager #: [phone]-Hours Pager #: [phone]

## 2015-10-09 NOTE — Progress Notes (Signed)
Physical Therapy Wound Treatment Patient Details  Name: Jacqueline Fuentes MRN: [id number] Date of Birth: 07-24-1950  Today's Date: 10/09/2015 Time: 0830-1004 Time Calculation (min): 94 min  Subjective  Subjective: Pt nonverbal throughout session; however, indicating pain during treatment Patient and Family Stated Goals: Non stated Date of Onset:  (Pt admitted with sacral and RLE wound)  Pain Score:  Pt premedicated and monitored by nursing student for pain throughout session.   Wound Assessment  Pressure Ulcer 09/21/15 Unstageable - Full thickness tissue loss in which the base of the ulcer is covered by slough (yellow, tan, gray, green or brown) and/or eschar (tan, brown or black) in the wound bed. (Active)  Dressing Type ABD;Barrier Film (skin prep);Gauze (Comment);Moist to dry 10/09/2015 10:59 AM  Dressing Clean;Dry;Intact 10/09/2015 10:59 AM  Dressing Change Frequency Daily 10/09/2015 10:59 AM  State of Healing Eschar 10/09/2015 10:59 AM  Site / Wound Assessment Red;Pink;Yellow;Brown;Black 10/09/2015 10:59 AM  % Wound base Red or Granulating 65% 10/09/2015 10:59 AM  % Wound base Yellow 30% 10/09/2015 10:59 AM  % Wound base Black 5% 10/09/2015 10:59 AM  % Wound base Other (Comment) 0% 10/08/2015  7:08 AM  Peri-wound Assessment Intact;Induration 10/09/2015 10:59 AM  Wound Length (cm) 18 cm 10/02/2015  9:00 AM  Wound Width (cm) 17 cm 10/02/2015  9:00 AM  Wound Depth (cm) 4.1 cm 10/02/2015  9:00 AM  Tunneling (cm) 5.5 09/21/2015  5:00 AM  Undermining (cm) 4.3cm 7 o'clock to 10 o'clock; 3.5cm 11 o'clock to 3 o'clock, 3.1cm 3 o'clock to 6 o'clock 10/02/2015  9:00 AM  Margins Unattached edges (unapproximated) 10/09/2015 10:59 AM  Drainage Amount Moderate 10/09/2015 10:59 AM  Drainage Description Serosanguineous 10/09/2015 10:59 AM  Treatment Debridement (Selective);Hydrotherapy (Pulse lavage);Packing (Saline gauze) 10/09/2015 10:59 AM   Santyl applied to wound bed prior to applying dressing.    Pressure Ulcer 09/21/15  Unstageable - Full thickness tissue loss in which the base of the ulcer is covered by slough (yellow, tan, gray, green or brown) and/or eschar (tan, brown or black) in the wound bed. unstageable pressure injuries to right heel and (Active)  Dressing Type ABD;Gauze (Comment);Barrier Film (skin prep);Moist to dry 10/09/2015 10:59 AM  Dressing Clean;Dry;Intact 10/09/2015 10:59 AM  Dressing Change Frequency Daily 10/09/2015 10:59 AM  State of Healing Eschar 10/09/2015 10:59 AM  Site / Wound Assessment Pink;Yellow;Brown 10/09/2015 10:59 AM  % Wound base Red or Granulating 10% 10/09/2015 10:59 AM  % Wound base Yellow 75% 10/09/2015 10:59 AM  % Wound base Black 15% 10/09/2015 10:59 AM  % Wound base Other (Comment) 0% 10/08/2015  7:08 AM  Peri-wound Assessment Intact;Induration 10/09/2015 10:59 AM  Wound Length (cm) 12.5 cm 10/09/2015 10:59 AM  Wound Width (cm) 3.5 cm 10/09/2015 10:59 AM  Wound Depth (cm) 0.4 cm 10/09/2015 10:59 AM  Margins Unattached edges (unapproximated) 10/09/2015 10:59 AM  Drainage Amount Minimal 10/09/2015 10:59 AM  Drainage Description Serosanguineous 10/09/2015 10:59 AM  Treatment Debridement (Selective);Hydrotherapy (Pulse lavage);Packing (Saline gauze) 10/09/2015 10:59 AM  Santyl applied to wound bed prior to applying dressing.   Hydrotherapy Pulsed lavage therapy - wound location: sacrum and RLE Pulsed Lavage with Suction (psi): 8 psi Pulsed Lavage with Suction - Normal Saline Used: 2000 mL Pulsed Lavage Tip: Tip with splash shield Selective Debridement Selective Debridement - Location: sacrum and RLE Selective Debridement - Tools Used: Forceps;Scalpel;Scissors Selective Debridement - Tissue Removed: Yellow and black/brown necrotic tissue   Wound Assessment and Plan  Wound Therapy - Assess/Plan/Recommendations Wound Therapy - Clinical Statement:  Progressing with selective removal of non-viable tissue on sacrum. Newly evaluated R lower leg wound today. Pt will benefit from continued hydrotherapy on  the RLE wound to promote healing of the wound bed and decrease bioburden.  Wound Therapy - Functional Problem List: Decreased tolerance of OOB and sitting position.  Factors Delaying/Impairing Wound Healing: Multiple medical problems;Immobility Hydrotherapy Plan: Debridement;Dressing change;Patient/family education;Pulsatile lavage with suction Wound Therapy - Frequency: 6X / week Wound Therapy - Follow Up Recommendations: Skilled nursing facility Wound Plan: See above  Wound Therapy Goals- Improve the function of patient's integumentary system by progressing the wound(s) through the phases of wound healing (inflammation - proliferation - remodeling) by: Decrease Necrotic Tissue to: 25% Decrease Necrotic Tissue - Progress: Progressing toward goal Increase Granulation Tissue to: 75% Increase Granulation Tissue - Progress: Progressing toward goal Goals/treatment plan/discharge plan were made with and agreed upon by patient/family: No, Patient unable to participate in goals/treatment/discharge plan and family unavailable Time For Goal Achievement: 7 days Wound Therapy - Potential for Goals: Fair  Goals will be updated until maximal potential achieved or discharge criteria met.  Discharge criteria: when goals achieved, discharge from hospital, MD decision/surgical intervention, no progress towards goals, refusal/missing three consecutive treatments without notification or medical reason.  GP     Rolinda Roan 10/09/2015, 11:17 AM   Rolinda Roan, PT, DPT Acute Rehabilitation Services Pager: [phone]

## 2015-10-09 NOTE — Progress Notes (Signed)
Family present at bedside. Daughter Judie Petit, husband of patient and another female family member. They indicated they had not spoken to MDs in recent days regarding new test results. MD on call paged and referred RN to attending for communication with family. New contact info is as follows:

## 2015-10-10 DIAGNOSIS — E870 Hyperosmolality and hypernatremia: Secondary | ICD-10-CM | POA: Diagnosis not present

## 2015-10-10 DIAGNOSIS — E43 Unspecified severe protein-calorie malnutrition: Secondary | ICD-10-CM

## 2015-10-10 DIAGNOSIS — I482 Chronic atrial fibrillation: Secondary | ICD-10-CM | POA: Diagnosis not present

## 2015-10-10 DIAGNOSIS — D62 Acute posthemorrhagic anemia: Secondary | ICD-10-CM | POA: Diagnosis not present

## 2015-10-10 DIAGNOSIS — J189 Pneumonia, unspecified organism: Secondary | ICD-10-CM | POA: Diagnosis not present

## 2015-10-10 DIAGNOSIS — N179 Acute kidney failure, unspecified: Secondary | ICD-10-CM | POA: Diagnosis not present

## 2015-10-10 DIAGNOSIS — A419 Sepsis, unspecified organism: Secondary | ICD-10-CM | POA: Diagnosis not present

## 2015-10-10 LAB — CBC
HCT: 23 % — ABNORMAL LOW (ref 36.0–46.0)
Hemoglobin: 7.3 g/dL — ABNORMAL LOW (ref 12.0–15.0)
MCH: 27.1 pg (ref 26.0–34.0)
MCHC: 31.7 g/dL (ref 30.0–36.0)
MCV: 85.5 fL (ref 78.0–100.0)
PLATELETS: 114 10*3/uL — AB (ref 150–400)
RBC: 2.69 MIL/uL — ABNORMAL LOW (ref 3.87–5.11)
RDW: 19.6 % — AB (ref 11.5–15.5)
WBC: 10.2 10*3/uL (ref 4.0–10.5)

## 2015-10-10 LAB — BASIC METABOLIC PANEL
Anion gap: 10 (ref 5–15)
BUN: 45 mg/dL — AB (ref 6–20)
CALCIUM: 8.2 mg/dL — AB (ref 8.9–10.3)
CO2: 32 mmol/L (ref 22–32)
CREATININE: 1.74 mg/dL — AB (ref 0.44–1.00)
Chloride: 106 mmol/L (ref 101–111)
GFR calc Af Amer: 34 mL/min — ABNORMAL LOW (ref >60)
GFR, EST NON AFRICAN AMERICAN: 30 mL/min — AB (ref >60)
GLUCOSE: 101 mg/dL — AB (ref 65–99)
Potassium: 3.5 mmol/L (ref 3.5–5.1)
SODIUM: 148 mmol/L — AB (ref 135–145)

## 2015-10-10 LAB — GLUCOSE, CAPILLARY
GLUCOSE-CAPILLARY: 87 mg/dL (ref 65–99)
Glucose-Capillary: 113 mg/dL — ABNORMAL HIGH (ref 65–99)
Glucose-Capillary: 113 mg/dL — ABNORMAL HIGH (ref 65–99)
Glucose-Capillary: 95 mg/dL (ref 65–99)

## 2015-10-10 MED ORDER — FREE WATER: 200.0000 mL | Freq: Three times a day (TID) | Status: AC

## 2015-10-10 MED ORDER — VITAL AF 1.2 CAL PO LIQD: 1000.0000 mL | ORAL | Status: AC

## 2015-10-10 MED ORDER — VALPROATE SODIUM 250 MG/5ML PO SYRP: 125.0000 mg | ORAL_SOLUTION | Freq: Two times a day (BID) | ORAL | Status: AC

## 2015-10-10 MED ORDER — COLLAGENASE 250 UNIT/GM EX OINT: TOPICAL_OINTMENT | Freq: Every day | CUTANEOUS | Status: AC

## 2015-10-10 MED ORDER — ACETAMINOPHEN 160 MG/5ML PO SOLN: 650.0000 mg | ORAL | Status: AC | PRN

## 2015-10-10 MED ORDER — OXYCODONE HCL 5 MG/5ML PO SOLN: 5.0000 mg | ORAL | Status: AC | PRN

## 2015-10-10 MED ORDER — CHLORHEXIDINE GLUCONATE 0.12% ORAL RINSE (MEDLINE KIT): 15.0000 mL | Freq: Two times a day (BID) | OROMUCOSAL | Status: AC

## 2015-10-10 MED ORDER — LABETALOL HCL 100 MG PO TABS: 100.0000 mg | ORAL_TABLET | Freq: Three times a day (TID) | ORAL | Status: AC

## 2015-10-10 MED ORDER — LEVALBUTEROL HCL 0.63 MG/3ML IN NEBU: 0.6300 mg | INHALATION_SOLUTION | RESPIRATORY_TRACT | Status: AC | PRN

## 2015-10-10 MED ORDER — DIAZEPAM 5 MG/ML IJ SOLN: 2.5000 mg | INTRAMUSCULAR | Status: AC | PRN

## 2015-10-10 MED ORDER — PANTOPRAZOLE SODIUM 40 MG PO PACK: 40.0000 mg | PACK | Freq: Two times a day (BID) | ORAL | Status: AC

## 2015-10-10 MED ORDER — DARBEPOETIN ALFA 60 MCG/0.3ML IJ SOSY: 60.0000 ug | PREFILLED_SYRINGE | INTRAMUSCULAR | Status: AC

## 2015-10-10 MED ORDER — AMLODIPINE BESYLATE 10 MG PO TABS: 10.0000 mg | ORAL_TABLET | Freq: Every day | ORAL | Status: AC

## 2015-10-10 MED ORDER — AMIODARONE HCL 200 MG PO TABS: 200.0000 mg | ORAL_TABLET | Freq: Every day | ORAL | Status: AC

## 2015-10-10 NOTE — Progress Notes (Signed)
Patient being transferred to [facility]. Report called to receiving nurse Zack. All questions answered. Patient being transferred via [place].

## 2015-10-10 NOTE — Discharge Summary (Signed)
DISCHARGE SUMMARY  Jacqueline Fuentes  MR#: [id number]  DOB:28-Jun-1951  Date of Admission: 09/20/2015 Date of Discharge: 10/10/2015  Attending Physician:Karli Wickizer T  Patient's MIW:OEHOZYY, MCKAY, MD  Consults: PCCM Palliative Care   Disposition: D/C back to [facility]   Tests Needing Follow-up: -attempt to keep SBP at ~110 or less in setting of Thoracic Aortic Aneurysm   Wound Care: -Santyl ointment for enzymatic debridement to right leg and sacrum  -Air mattress in place to reduce pressure tosacrum/buttocks. -hydrotherapy daily to assist with removal of nonviable tissue of leg and sacrum  Discharge Diagnoses: Chronic respiratory failure with ventilator dependence Recurrent Right effusion  Ascending Aortic Aneurysm w/ dissection ([facility] A)  Essential hypertension Chronic Atrial Fibrillation Mild Chronic Diastolic CHF Acute on CKD (baseline Cr 0.97 06/30/15)  Acute blood loss + anemia of chronic illness Severe protein calorie malnutrition  Sepsis unspecified organism, source unclear consider Seizure disorder Anxiety/depression Multiple prior CVAs Hypomagnesemia Hypernatremia Osteomyelitis/Sacral Decubitus Ulcer unstageable - R ankle wound Chronic pain Edema     Initial presentation: 65 year old F from [facility] w/ a PMHxQuadriplegia, Trach/Vent/PEG dependent, Seizure disorder, multiple CVAs, Atrial Fibrillation, Chronic Diastolic CHF CKD, Sacral Decubitus, Rt sided Pleural Effusion with Pleur-X in place, and Lt Hydronephrosis with Chronic Nephrostomy tube who was recently treated for funguria with diflucan, pseudomonas PNA with ertapenem, and sacral wound positive for MRSA. She was also started on cefepime and vancomycin, for what was reportedly a MRSA PNA. She had been requiring frequent blood transfusions for what was thought to be progression of her chronic anemia, however, she has multiple antibodies in her blood and is difficult to match. [facility]  listed her as futility of care due to this anemia, however husband had reportedly been very reluctant to address goals of care.   On 3/15 Jacqueline Fuentes developed hypotension and hemoglobin was noted to be 4.2. She was transfused 1 unit PRBC, which was all that was available to [facility] at the time. Husband was contacted and requested a transfer to the emergency department so that she could receive further transfusions. In the emergency department she was noted to be febrile with leukocytosis. Hemoglobin was confirmed to be low at 5. Transfusions were ordered by ED staff.  Hospital Course:  Chronic respiratory failure with ventilator dependence vent management per PCCM - no evidence of vent difficulty at this time   Recurrent Right effusion  CT w/o evidence of signif effusion at this time - no resp distress at time of d/c   Ascending Aortic Aneusrym w/ dissection ([facility] A)  Noted on CXR, dating back to Dec 2016 - CT chest confirmed - TCTS was consulted and confirmed pt is not a surgical candidate - med tx is only option - informed husband via phone call 10/10/14 after prior failed attempts - he informed me that he was already aware of this   Essential hypertension Blood pressure control improved  - follow as outpt   Chronic Atrial Fibrillation Amiodarone + BB - heart rate controlled - not a candidate for anticoagulation due to recurring severe anemia  Mild Chronic Diastolic CHF Echocardiogram confirms preserved systolic fxn and findings c/w only mild/modest diastolic dysfxn - no evidence of severe volume overload at present - follow  Filed Weights   10/08/15 0422 10/09/15 0500 10/10/15 0400  Weight: 92.08 kg (203 lb) 93.895 kg (207 lb) 97.523 kg (215 lb)   Acute on CKD (baseline Cr 0.97 06/30/15)  Lt hydronephrosis with nephrostomy tube (chronic) - Chronic indwelling foley  catheter - not a dialysis candidate - creatinine improving   Last Labs      Recent Labs Lab 10/03/15 0500  10/04/15 0600 10/06/15 0519 10/09/15 0410  CREATININE 1.95* 1.83* 1.77* 1.65*     Acute blood loss + anemia of chronic illness -Suspected low-grade ongoing loss related to hematuria+/- GIB -also may be a component of hemolysis related to large chronic thoracic aortic dissection  -no further interventions available - anemia treated w/ blood transfusions x6 units this admit  -loaded w/ IV Fe 10/09/2015  Severe protein calorie malnutrition  -Continue tube feeds  Sepsis unspecified organism, source unclear consider -Multifactorial PNA, UTI, Sacral wound, or any number of chronic indwelling lines/tubes -Course of antibiotics completed and now off antibiotics -Afebrile with normal white blood cell count   Seizure disorder Continue usual medical therapy  Anxiety/depression Appears comfortable at time of my visit prior to d/c    Multiple prior CVAs  Hypomagnesemia Corrected  Hypernatremia Had been improving w/ free water - follow   Osteomyelitis/Sacral Decubitus Ulcer unstageable - R ankle wound -Care as directed by [place] with stage 4 pressure injury; 74B63A4.5cm with undermining to wound edges to 5.5cm, large amt tan foul drainage, 40% yellow slough, 45% black eschar, 15% red. Bone palpable, patchy areas of red macerated partial thickness skin loss to surrounding buttocks, appearance consistent with moisture associated skin damage. -Unstageable pressure injury to right inner heel 2X2cm, 100% dry eschar without odor, drainage, or fluctuance. Inner ankle with previous partial thickness wound which has evolved into unstageable, 100% dry intact eschar; .2X.2X.1cm, pink and moist, no odor or drainage. Right upper posterior leg with full thickness wound is healing, pink moist wound bed 1X1X.1cm, no odor, scant amt yellow drainage. Right posterior calf with unstageable pressure injury; 12X3cm, previously 100% dry eschar, has now evolved into 90% loose slough with  fluctuance and mod amt tan drainage, slight odor.   Chronic pain  Edema Pt developing intermittent swelling of arms/hands - this is likely due to low albumin, but could also be sign of UE DVT - nonetheless, the pt is not a candidate for anticaog given recurrent life threatening anemia, therefore I did not order doppler evals         Medication List    STOP taking these medications        ceFEPIme 1 g in dextrose 5 % 50 mL     ciprofloxacin 500 MG tablet  Commonly known as:  CIPRO     escitalopram 20 MG tablet  Commonly known as:  LEXAPRO     famotidine 20 MG tablet  Commonly known as:  PEPCID     ferrous sulfate 325 (65 FE) MG tablet     hydrALAZINE 25 MG tablet  Commonly known as:  APRESOLINE     HYDROcodone-acetaminophen 5-325 MG tablet  Commonly known as:  NORCO/VICODIN     ipratropium-albuterol 0.5-2.5 (3) MG/3ML Soln  Commonly known as:  DUONEB     metoprolol tartrate 25 MG tablet  Commonly known as:  LOPRESSOR     traZODone 50 MG tablet  Commonly known as:  DESYREL     vancomycin 1,000 mg in sodium chloride 0.9 % 250 mL      TAKE these medications        acetaminophen 160 MG/5ML solution  Commonly known as:  TYLENOL  Place 20.3 mLs (650 mg total) into feeding tube every 4 (four) hours as needed for mild pain, headache or fever.     amiodarone  200 MG tablet  Commonly known as:  PACERONE  Place 1 tablet (200 mg total) into feeding tube daily.     amLODipine 10 MG tablet  Commonly known as:  NORVASC  Place 1 tablet (10 mg total) into feeding tube daily.     chlorhexidine gluconate (SAGE KIT) 0.12 % solution  Commonly known as:  PERIDEX  15 mLs by Mouth Rinse route 2 (two) times daily.     collagenase ointment  Commonly known as:  SANTYL  Apply topically daily.     collagenase ointment  Commonly known as:  SANTYL  Apply topically daily.     Darbepoetin Alfa 60 MCG/0.3ML Sosy injection  Commonly known as:  ARANESP  Inject 0.3 mLs (60 mcg  total) into the skin every Thursday at 6pm.     diazepam 5 MG/ML injection  Commonly known as:  VALIUM  Inject 0.5 mLs (2.5 mg total) into the vein every 4 (four) hours as needed.     feeding supplement (VITAL AF 1.2 CAL) Liqd  Place 1,000 mLs into feeding tube continuous.     free water Soln  Place 200 mLs into feeding tube every 8 (eight) hours.     labetalol 100 MG tablet  Commonly known as:  NORMODYNE  Place 1 tablet (100 mg total) into feeding tube 3 (three) times daily.     levalbuterol 0.63 MG/3ML nebulizer solution  Commonly known as:  XOPENEX  Take 3 mLs (0.63 mg total) by nebulization every 3 (three) hours as needed for wheezing.     oxyCODONE 5 MG/5ML solution  Commonly known as:  ROXICODONE  Place 5-10 mLs (5-10 mg total) into feeding tube every 2 (two) hours as needed for moderate pain.     pantoprazole sodium 40 mg/20 mL Pack  Commonly known as:  PROTONIX  Place 20 mLs (40 mg total) into feeding tube 2 (two) times daily.     valproic acid 250 MG/5ML syrup  Commonly known as:  DEPAKENE  Place 2.5 mLs (125 mg total) into feeding tube 2 (two) times daily.        Day of Discharge BP 106/57 mmHg  Pulse 72  Temp(Src) 98.8 F (37.1 C) (Oral)  Resp 19  Ht '5\' 8"'  (1.727 m)  Wt 97.523 kg (215 lb)  BMI 32.70 kg/m2  SpO2 98%  Physical Exam: General: No acute respiratory distress - noncommunicative  Lungs: Clear to auscultation bilaterally  Cardiovascular: Regular rate without murmur gallop or rub  Abdomen: Nondistended, soft, bowel sounds positive, no ascites, no appreciable mass - PEG insertion clean and dry  Extremities: No significant cyanosis, or clubbing - 2+ anasarca   Basic Metabolic Panel:  Recent Labs Lab 10/04/15 0600 10/06/15 0519 10/09/15 0410 10/10/15 0328  NA 151* 146* 149* 148*  K 3.9 3.5 3.5 3.5  CL 113* 109 109 106  CO2 31 31 33* 32  GLUCOSE 99 116* 113* 101*  BUN 63* 53* 43* 45*  CREATININE 1.83* 1.77* 1.65* 1.74*  CALCIUM 8.3*  8.3* 8.3* 8.2*    CBC:  Recent Labs Lab 10/04/15 0657 10/09/15 0410 10/10/15 0328  WBC 9.4 8.9 10.2  HGB 7.3* 6.7* 7.3*  HCT 24.7* 22.3* 23.0*  MCV 86.7 86.4 85.5  PLT 129* 134* 114*   BNP (last 3 results)  Recent Labs  06/30/15 0018 09/20/15 2306 10/06/15 0519  BNP 402.9* 249.6* 524.2*   CBG:  Recent Labs Lab 10/09/15 2146 10/09/15 2338 10/10/15 0441 10/10/15 0800 10/10/15 1203  GLUCAP 100* 87  113* 113* 95    Time spent in discharge (includes decision making & examination of pt): >35 minutes  10/10/2015, 1:59 PM   Cherene Altes, MD [facility] Office  [phone] Pager [phone]  On-Call/Text Page:      [email or web address]      password [facility]

## 2015-10-10 NOTE — Progress Notes (Signed)
Physical Therapy Wound Treatment Patient Details  Name: Jacqueline Fuentes MRN: [id number] Date of Birth: [birth year]  Today's Date: 10/10/2015 Time: 7001-7494 Time Calculation (min): 77 min  Subjective  Subjective: Pt nonverbal throughout session; however, indicating pain during treatment Patient and Family Stated Goals: Non stated  Pain Score: Pt does not appear in distress throughout session. Was premedicated.   Wound Assessment  Pressure Ulcer 09/21/15 Unstageable - Full thickness tissue loss in which the base of the ulcer is covered by slough (yellow, tan, gray, green or brown) and/or eschar (tan, brown or black) in the wound bed. (Active)  Dressing Type ABD;Barrier Film (skin prep);Gauze (Comment);Moist to dry 10/10/2015 10:10 AM  Dressing Changed;Clean;Dry;Intact 10/10/2015 10:10 AM  Dressing Change Frequency Daily 10/10/2015 10:10 AM  State of Healing Eschar 10/10/2015 10:10 AM  Site / Wound Assessment Red;Pink;Yellow;Brown;Black 10/10/2015 10:10 AM  % Wound base Red or Granulating 65% 10/10/2015 10:10 AM  % Wound base Yellow 30% 10/10/2015 10:10 AM  % Wound base Black 5% 10/10/2015 10:10 AM  % Wound base Other (Comment) 0% 10/09/2015  8:00 AM  Peri-wound Assessment Intact;Induration 10/10/2015 10:10 AM  Wound Length (cm) 18 cm 10/02/2015  9:00 AM  Wound Width (cm) 17 cm 10/02/2015  9:00 AM  Wound Depth (cm) 4.1 cm 10/02/2015  9:00 AM  Tunneling (cm) 5.5 09/21/2015  5:00 AM  Undermining (cm) 4.3cm 7 o'clock to 10 o'clock; 3.5cm 11 o'clock to 3 o'clock, 3.1cm 3 o'clock to 6 o'clock 10/02/2015  9:00 AM  Margins Unattached edges (unapproximated) 10/10/2015 10:10 AM  Drainage Amount Moderate 10/10/2015 10:10 AM  Drainage Description Serosanguineous 10/10/2015 10:10 AM  Treatment Debridement (Selective);Hydrotherapy (Pulse lavage);Packing (Saline gauze) 10/10/2015 10:10 AM   Santyl applied to wound bed prior to applying dressing.   Pressure Ulcer 09/21/15 Unstageable - Full thickness tissue loss in which the base of  the ulcer is covered by slough (yellow, tan, gray, green or brown) and/or eschar (tan, brown or black) in the wound bed. unstageable pressure injuries to right heel and (Active)  Dressing Type ABD;Gauze (Comment);Barrier Film (skin prep);Moist to dry 10/10/2015 10:10 AM  Dressing Clean;Dry;Intact 10/10/2015 10:10 AM  Dressing Change Frequency Daily 10/10/2015 10:10 AM  State of Healing Eschar 10/10/2015 10:10 AM  Site / Wound Assessment Pink;Red;Yellow;Brown 10/10/2015 10:10 AM  % Wound base Red or Granulating 20% 10/10/2015 10:10 AM  % Wound base Yellow 80% 10/10/2015 10:10 AM  % Wound base Black 15% 10/09/2015 10:59 AM  % Wound base Other (Comment) 0% 10/09/2015  8:00 AM  Peri-wound Assessment Intact;Induration 10/10/2015 10:10 AM  Wound Length (cm) 12.5 cm 10/09/2015 10:59 AM  Wound Width (cm) 3.5 cm 10/09/2015 10:59 AM  Wound Depth (cm) 0.4 cm 10/09/2015 10:59 AM  Margins Unattached edges (unapproximated) 10/10/2015 10:10 AM  Drainage Amount Minimal 10/10/2015 10:10 AM  Drainage Description Serosanguineous 10/10/2015 10:10 AM  Treatment Debridement (Selective);Hydrotherapy (Pulse lavage);Packing (Saline gauze) 10/10/2015 10:10 AM  Santyl applied to wound bed prior to applying dressing.   Hydrotherapy Pulsed lavage therapy - wound location: sacrum and RLE Pulsed Lavage with Suction (psi): 8 psi Pulsed Lavage with Suction - Normal Saline Used: 2000 mL Pulsed Lavage Tip: Tip with splash shield Selective Debridement Selective Debridement - Location: sacrum and RLE Selective Debridement - Tools Used: Forceps;Scalpel;Scissors Selective Debridement - Tissue Removed: Yellow and black/brown necrotic tissue   Wound Assessment and Plan  Wound Therapy - Assess/Plan/Recommendations Wound Therapy - Clinical Statement: Progressing with selective removal of non-viable tissue on sacrum. Pt will benefit from continued  hydrotherapy on the RLE wound to promote healing of the wound bed and decrease bioburden.  Wound Therapy -  Functional Problem List: Decreased tolerance of OOB and sitting position.  Factors Delaying/Impairing Wound Healing: Multiple medical problems;Immobility Hydrotherapy Plan: Debridement;Dressing change;Patient/family education;Pulsatile lavage with suction Wound Therapy - Frequency: 6X / week Wound Therapy - Follow Up Recommendations: Skilled nursing facility Wound Plan: See above  Wound Therapy Goals- Improve the function of patient's integumentary system by progressing the wound(s) through the phases of wound healing (inflammation - proliferation - remodeling) by: Decrease Necrotic Tissue to: 25% Decrease Necrotic Tissue - Progress: Progressing toward goal Increase Granulation Tissue to: 75% Increase Granulation Tissue - Progress: Progressing toward goal Goals/treatment plan/discharge plan were made with and agreed upon by patient/family: No, Patient unable to participate in goals/treatment/discharge plan and family unavailable Time For Goal Achievement: 7 days Wound Therapy - Potential for Goals: Fair  Goals will be updated until maximal potential achieved or discharge criteria met.  Discharge criteria: when goals achieved, discharge from hospital, MD decision/surgical intervention, no progress towards goals, refusal/missing three consecutive treatments without notification or medical reason.  GP     Rolinda Roan 10/10/2015, 11:58 AM   Rolinda Roan, PT, DPT Acute Rehabilitation Services Pager: [phone]

## 2015-10-10 NOTE — Clinical Social Work Placement (Signed)
   CLINICAL SOCIAL WORK PLACEMENT  NOTE  Date:  10/10/2015  Patient Details  Name: Jacqueline Fuentes MRN: [id number] Date of Birth: July 17, 1950  Clinical Social Work is seeking post-discharge placement for this patient at the [place] level of care (*CSW will initial, date and re-position this form in  chart as items are completed):  Yes   Patient/family provided with [place] Work Department's list of facilities offering this level of care within the geographic area requested by the patient (or if unable, by the patient's family).  Yes   Patient/family informed of their freedom to choose among providers that offer the needed level of care, that participate in Medicare, Medicaid or managed care program needed by the patient, have an available bed and are willing to accept the patient.  Yes   Patient/family informed of 's ownership interest in [facility] and [facility], as well as of the fact that they are under no obligation to receive care at these facilities.  PASRR submitted to EDS on       PASRR number received on       Existing PASRR number confirmed on 10/10/15     FL2 transmitted to all facilities in geographic area requested by pt/family on 10/22/15     FL2 transmitted to all facilities within larger geographic area on       Patient informed that his/her managed care company has contracts with or will negotiate with certain facilities, including the following:        Yes   Patient/family informed of bed offers received.  Patient chooses bed at [place]     Physician recommends and patient chooses bed at      Patient to be transferred to [facility] on 10/10/15.  Patient to be transferred to facility by [facility]     Patient family notified on 10/10/15 of transfer.  Name of family member notified:  Daughters     PHYSICIAN       Additional  Comment:    _______________________________________________ Benard Halsted, [place] 10/10/2015, 3:25 PM

## 2015-10-10 NOTE — Progress Notes (Signed)
Patient will DC to: [facility] Anticipated DC date: 10/10/15 Family notified: Daughters notified; LM for spouse Transport by: [facility]  CSW signing off.  Cedric Fishman, [place] Social Worker [phone]

## 2015-10-13 LAB — TYPE AND SCREEN
ABO/RH(D): O POS
ANTIBODY SCREEN: POSITIVE
DAT, IGG: POSITIVE
Unit division: 0
Unit division: 0
Unit division: 0
Unit division: 0
Unit division: 0

## 2015-10-30 ENCOUNTER — Other Ambulatory Visit (HOSPITAL_COMMUNITY): Payer: Self-pay | Admitting: Internal Medicine

## 2015-10-30 ENCOUNTER — Ambulatory Visit (HOSPITAL_COMMUNITY)
Admission: RE | Admit: 2015-10-30 | Discharge: 2015-10-30 | Disposition: A | Discharge status: Home / Self Care | Payer: Medicare Other | Source: Ambulatory Visit | Attending: Internal Medicine | Admitting: Internal Medicine

## 2015-10-30 DIAGNOSIS — Z8673 Personal history of transient ischemic attack (TIA), and cerebral infarction without residual deficits: Secondary | ICD-10-CM | POA: Insufficient documentation

## 2015-10-30 DIAGNOSIS — G40909 Epilepsy, unspecified, not intractable, without status epilepticus: Secondary | ICD-10-CM | POA: Diagnosis not present

## 2015-10-30 DIAGNOSIS — D649 Anemia, unspecified: Secondary | ICD-10-CM | POA: Diagnosis not present

## 2015-10-30 DIAGNOSIS — E0590 Thyrotoxicosis, unspecified without thyrotoxic crisis or storm: Secondary | ICD-10-CM | POA: Diagnosis not present

## 2015-10-30 DIAGNOSIS — L89159 Pressure ulcer of sacral region, unspecified stage: Secondary | ICD-10-CM | POA: Diagnosis not present

## 2015-10-30 DIAGNOSIS — I4891 Unspecified atrial fibrillation: Secondary | ICD-10-CM | POA: Insufficient documentation

## 2015-10-30 DIAGNOSIS — J90 Pleural effusion, not elsewhere classified: Secondary | ICD-10-CM | POA: Insufficient documentation

## 2015-10-30 DIAGNOSIS — I5032 Chronic diastolic (congestive) heart failure: Secondary | ICD-10-CM | POA: Diagnosis not present

## 2015-10-30 DIAGNOSIS — N189 Chronic kidney disease, unspecified: Secondary | ICD-10-CM

## 2015-10-30 DIAGNOSIS — J9610 Chronic respiratory failure, unspecified whether with hypoxia or hypercapnia: Secondary | ICD-10-CM | POA: Diagnosis not present

## 2015-10-30 DIAGNOSIS — C541 Malignant neoplasm of endometrium: Secondary | ICD-10-CM | POA: Insufficient documentation

## 2015-10-30 DIAGNOSIS — G8250 Quadriplegia, unspecified: Secondary | ICD-10-CM | POA: Insufficient documentation

## 2015-10-30 DIAGNOSIS — N179 Acute kidney failure, unspecified: Secondary | ICD-10-CM

## 2015-10-30 DIAGNOSIS — Z436 Encounter for attention to other artificial openings of urinary tract: Secondary | ICD-10-CM | POA: Diagnosis present

## 2015-10-30 DIAGNOSIS — N1330 Unspecified hydronephrosis: Secondary | ICD-10-CM | POA: Insufficient documentation

## 2015-10-30 DIAGNOSIS — Z9911 Dependence on respirator [ventilator] status: Secondary | ICD-10-CM | POA: Diagnosis not present

## 2015-10-30 DIAGNOSIS — K219 Gastro-esophageal reflux disease without esophagitis: Secondary | ICD-10-CM | POA: Insufficient documentation

## 2015-10-30 DIAGNOSIS — Z22322 Carrier or suspected carrier of Methicillin resistant Staphylococcus aureus: Secondary | ICD-10-CM | POA: Diagnosis not present

## 2015-10-30 MED ORDER — IOPAMIDOL (ISOVUE-300) INJECTION 61%
50.0000 mL | Freq: Once | INTRAVENOUS | Status: AC | PRN
  Administered 2015-10-30: 50 mL via INTRAVENOUS

## 2015-10-30 NOTE — Sedation Documentation (Signed)
Pt vitals stable, no sedation at this time.

## 2015-10-30 NOTE — Procedures (Signed)
Successful replacement of left nephrostomy tube.  No immediate complication.  No blood loss.

## 2015-10-30 NOTE — Sedation Documentation (Signed)
Patient is resting comfortably. No sedation given. 

## 2015-10-30 NOTE — H&P (Signed)
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  History of Present Illness: Jacqueline Fuentes is a 65 y.o. female here today for replacement of her left nephrostomy tube which was inadvertently removed.  It was last replaced by Dr. Barbie Banner in December.  She is from [facility] and is trach/vent/PEG dependent at baseline.   She has complicated past medical history as below, which includes seizure disorder, multiple CVAs, atrial fibrillation, chronic kidney disease, sacral decubitus, R sided pleural effusion with Pleur-X in place, diastolic CHF, and L hydronephrosis with chronic nephrostomy tube.   I have reviewed Care Everywhere and it appears that her nephrostomy tube was initially placed for hydronephrosis secondary to Endometrial cancer which may have been invading the bladder.   It is difficult to tell as we do not have notes from her Urologist who is Dr. Kevin Fenton from [facility] at [facility] in [place], [place].  She has also recently been treated for funguria with diflucan, pseudomonas PNA with ertapenem, and sacral wound has tested positive for MRSA. She was recently started on cefepime and vancomycin, for what is reportedly a MRSA PNA.   She has also been requiring frequent blood transfusions for what is thought to be progression of her chronic anemia, however, she has multiple antibodies in her blood and is difficult to find transfusions for.   [facility] has listed her as futility of care due to this anemia, however, husband has reportedly been very reluctant to address goals of care.   She is trached and therefore non-verbal and all history was obtained from her chart.   Past Medical History  Diagnosis Date  . Ventilator dependent ([place])   . Atrial fibrillation ([place])   . Seizures ([place])   . Pleural  effusion   . Renal disorder   . Stroke ([place])   . Quadriplegia, functional ([place])   . CHF (congestive heart failure) ([place])   . Hyperthyroidism   . Anemia   . GERD (gastroesophageal reflux disease)     Past Surgical History  Procedure Laterality Date  . Abdominal surgery      Allergies: Aspirin; Heparin; and Lorazepam  Medications: Prior to Admission medications   Medication Sig Start Date End Date Taking? Authorizing Provider  acetaminophen (TYLENOL) 160 MG/5ML solution Place 20.3 mLs (650 mg total) into feeding tube every 4 (four) hours as needed for mild pain, headache or fever. 10/10/15   Cherene Altes, MD  amiodarone (PACERONE) 200 MG tablet Place 1 tablet (200 mg total) into feeding tube daily. 10/10/15   Cherene Altes, MD  amLODipine (NORVASC) 10 MG tablet Place 1 tablet (10 mg total) into feeding tube daily. 10/10/15   Cherene Altes, MD  chlorhexidine gluconate, SAGE KIT, (PERIDEX) 0.12 % solution 15 mLs by Mouth Rinse route 2 (two) times daily. 10/10/15   Cherene Altes, MD  collagenase (SANTYL) ointment Apply topically daily. 10/10/15   Cherene Altes, MD  collagenase (SANTYL) ointment Apply topically daily. 10/10/15   Cherene Altes, MD  Darbepoetin Alfa (ARANESP) 60 MCG/0.3ML SOSY injection Inject 0.3 mLs (60 mcg total) into the skin every Thursday at 6pm. 10/10/15   Cherene Altes, MD  diazepam (VALIUM) 5 MG/ML injection Inject 0.5 mLs (2.5 mg total) into the vein every 4 (four) hours as needed. 10/10/15   Cherene Altes, MD  labetalol (NORMODYNE) 100  MG tablet Place 1 tablet (100 mg total) into feeding tube 3 (three) times daily. 10/10/15   Cherene Altes, MD  levalbuterol Penne Lash) 0.63 MG/3ML nebulizer solution Take 3 mLs (0.63 mg total) by nebulization every 3 (three) hours as needed for wheezing. 10/10/15   Cherene Altes, MD  Nutritional Supplements (FEEDING SUPPLEMENT, VITAL AF 1.2 CAL,) LIQD Place 1,000 mLs into feeding tube continuous. 10/10/15   Cherene Altes, MD  oxyCODONE (ROXICODONE) 5 MG/5ML solution Place 5-10 mLs (5-10 mg total) into feeding tube every 2 (two) hours as needed for moderate pain. 10/10/15   Cherene Altes, MD  pantoprazole sodium (PROTONIX) 40 mg/20 mL PACK Place 20 mLs (40 mg total) into feeding tube 2 (two) times daily. 10/10/15   Cherene Altes, MD  valproic acid (DEPAKENE) 250 MG/5ML syrup Place 2.5 mLs (125 mg total) into feeding tube 2 (two) times daily. 10/10/15   Cherene Altes, MD  Water For Irrigation, Sterile (FREE WATER) SOLN Place 200 mLs into feeding tube every 8 (eight) hours. 10/10/15   Cherene Altes, MD     No family history on file.  Social History   Social History  . Marital Status: Married    Spouse Name: N/A  . Number of Children: N/A  . Years of Education: N/A   Social History Main Topics  . Smoking status: Never Smoker   . Smokeless tobacco: Never Used     Comment: ujnknown smoking history  . Alcohol Use: No  . Drug Use: Not on file  . Sexual Activity: Not on file   Other Topics Concern  . Not on file   Social History Narrative    Review of Systems  Unable to perform ROS: Patient nonverbal    Vital Signs: BP 144/76 mmHg  Pulse 74  Resp 18  SpO2 94%  Physical Exam  Constitutional:  Obese, Anasarca  HENT:  Head: Normocephalic and atraumatic.  Cardiovascular: Normal rate, regular rhythm and normal heart sounds.   Pulmonary/Chest:  Ventilator dependent  Abdominal: Soft. Bowel sounds are normal.  Neurological:  Opens eyes to voice. Quadriplegia secondary to CVAs per chart.  Vitals reviewed. PCN removed, track visible.  Mallampati Score:  MD Evaluation Airway: Other (comments) Airway comments: Tracheostomy/ ventilator dependent Heart: WNL Abdomen: WNL Chest/ Lungs: WNL ASA  Classification: 3 Mallampati/Airway Score:  (Tracheostomy/vent)  Imaging: Dg Lumbar Spine 2-3 Views  10/01/2015  CLINICAL DATA:  Osteomyelitis.  Sacral decubitus. EXAM: LUMBAR SPINE -  2-3 VIEW COMPARISON:  None. FINDINGS: Lumbar scoliosis. Previous laminectomy. Aortoiliac stent graft. Calcified pelvic fibroid obscures much of the central sacrum below S2. Cross-table lateral does not show clear-cut area of sacral osteomyelitis. If further investigation desired, and no contraindications, MRI recommended. IMPRESSION: No definite sacral osteomyelitis. Consider MRI if no contraindications. Electronically Signed   By: Staci Righter M.D.   On: 10/01/2015 12:26   Ct Chest Wo Contrast  10/06/2015  ADDENDUM REPORT: 10/06/2015 20:36 ADDENDUM: These results were called by telephone at the time of interpretation on 10/06/2015 at 8:36 pm to Dr. Joette Catching , who verbally acknowledged these results. Electronically Signed   By: Marin Olp M.D.   On: 10/06/2015 20:36  10/06/2015  CLINICAL DATA:  Follow-up respiratory distress. EXAM: CT CHEST WITHOUT CONTRAST TECHNIQUE: Multidetector CT imaging of the chest was performed following the standard protocol without IV contrast. COMPARISON:  Chest x-ray 09/28/2015 FINDINGS: Tracheostomy tube appears in adequate position. Right-sided PICC line has tip in the SVC. Paraseptal emphysematous  changes are present over the mid to upper lungs. There is consolidation over the posterior right upper lobe/ right middle lobe and posterior left upper lobe/lingula as well as posterior lung bases suggesting dependent atelectasis. Cannot completely exclude infection. Small left effusion. Narrowing of the distal trachea and central bronchi due in part to compression by severe dilatation of the ascending aorta. Moderate cardiomegaly is present. There is calcified plaque over the left main and 3 vessel coronary arteries. There is severe dilatation of the ascending thoracic aorta extending to the arch as this measures approximately 6.8 x 9.4 cm in transverse in AP dimension over the mid ascending segment. Evaluation of this is limited due to lack of intravenous contrast. This may  represent a large aneurysm versus dissection. No pericardial effusion. Remaining mediastinal structures are unremarkable. Heterogeneity to the thyroid gland. Images over the upper abdomen demonstrate surgical clips adjacent the proximal abdominal aorta. Mild diffuse subcutaneous edema. Moderate degenerative change of the spine. IMPRESSION: Severe dilatation of the ascending thoracic aorta extending to the level of the arch measuring 6.8 x 9.4 cm in transverse in AP diameter. Evaluation limited due to lack of intravenous contrast as this may represent a large aneurysm versus dissection. No definite mediastinal hemorrhage or a pericardial fluid collection. Recommend contrast-enhanced CTA chest. Posterior dependent consolidation bilaterally likely atelectasis, although cannot exclude infection. Small left pleural effusion. Mild to moderate cardiomegaly. Electronically Signed: By: Marin Olp M.D. On: 10/06/2015 20:24   Ct Angio Chest Aorta W/cm &/or Wo/cm  10/07/2015  CLINICAL DATA:  Followup of aortic aneurysm. Chronic respiratory failure. Ventilator dependent. Functional quadriplegic. Congestive heart failure. EXAM: CT ANGIOGRAPHY CHEST WITH CONTRAST TECHNIQUE: Multidetector CT imaging of the chest was performed using the standard protocol during bolus administration of intravenous contrast. Multiplanar CT image reconstructions and MIPs were obtained to evaluate the vascular anatomy. CONTRAST:  77m OMNIPAQUE IOHEXOL 350 MG/ML SOLN COMPARISON:  Nonenhanced CT of 1 day earlier. FINDINGS: Mediastinum/Nodes: Suboptimal contrast opacification secondary to difficulty with bolus trigger and decreased contrast dosage. A right-sided PICC line terminates at the mid SVC. Moderate cardiomegaly with multivessel coronary artery atherosclerosis. The aortic root is grossly normal. Centered in the mid ascending aorta is aneurysmal dilatation, including at 6.6 cm on image 69/ series 401. Dissection flap is identified, including  on images 68 and 46/ series 401. Dissection/aneurysm continues into the transverse aorta, but excludes the descending segment. Maximal transverse dimension of the distal most ascending aorta at 7.6 x 8.6 cm on image 54. Involvement of the great vessels cannot be evaluated. No gross extension of the great vessels. No surrounding hemorrhage or evidence of pericardial hemorrhage. Tortuous descending thoracic aorta. Pulmonary artery enlargement, including a 4.2 cm outflow tract. No gross mediastinal adenopathy. Small hiatal hernia. Lungs/Pleura: Small left pleural effusion. Moderate degradation secondary to patient body habitus and motion. Endotracheal tube terminates just below the level of the clavicles. Paraseptal emphysema. Dependent right upper lobe pulmonary opacity is similar. Left lower lobe patchy airspace disease is not significantly changed. Upper abdomen: Degradation continuing into the upper abdomen. Grossly normal liver, spleen, pancreas, adrenal glands, kidneys. Incompletely imaged abdominal aortic aneurysm repair. Musculoskeletal: Moderate thoracic spondylosis. Review of the MIP images confirms the above findings. IMPRESSION: 1. Moderate to severely degraded exam, secondary to multiple factors including patient size, motion, and suboptimal contrast opacification. 2. 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 No surrounding hemorrhage or evidence of pericardial fluid or blood. 3. Left base  airspace disease, suspicious for pneumonia. Small left pleural effusion. Right upper lobe opacity is favored to represent atelectasis. 4. Cardiomegaly with coronary artery atherosclerosis. 5. Pulmonary artery enlargement suggests pulmonary arterial hypertension. 6. Small hiatal hernia. These results will be called to the ordering clinician or representative by the Radiologist Assistant, and communication documented in the PACS or  zVision Dashboard. Electronically Signed   By: Abigail Miyamoto M.D.   On: 10/07/2015 02:52    Labs:  CBC:  Recent Labs  10/03/15 0500 10/04/15 0657 10/09/15 0410 10/10/15 0328  WBC 9.9 9.4 8.9 10.2  HGB 7.0* 7.3* 6.7* 7.3*  HCT 22.6* 24.7* 22.3* 23.0*  PLT 127* 129* 134* 114*    COAGS:  Recent Labs  09/21/15 0133  INR 1.59*    BMP:  Recent Labs  10/04/15 0600 10/06/15 0519 10/09/15 0410 10/10/15 0328  NA 151* 146* 149* 148*  K 3.9 3.5 3.5 3.5  CL 113* 109 109 106  CO2 31 31 33* 32  GLUCOSE 99 116* 113* 101*  BUN 63* 53* 43* 45*  CALCIUM 8.3* 8.3* 8.3* 8.2*  CREATININE 1.83* 1.77* 1.65* 1.74*  GFRNONAA 28* 29* 32* 30*  GFRAA 32* 34* 37* 34*    LIVER FUNCTION TESTS:  Recent Labs  09/22/15 0512 09/30/15 0905 10/01/15 1320 10/02/15 0500  BILITOT 0.7 0.6 0.8 0.4  AST 20 11* 12* 11*  ALT 16 8* 8* 7*  ALKPHOS 96 66 60 60  PROT 6.4* 7.0 7.2 7.0  ALBUMIN 1.1* 1.4* 1.4* 1.4*    TUMOR MARKERS: No results for input(s): AFPTM, CEA, CA199, CHROMGRNA in the last 8760 hours.  Assessment and Plan:  Hydronephrosis probably secondary to endometrial cancer with bladder involvement  Inadvertently removed.   Will replace today by Dr. Anselm Pancoast  Risks and Benefits discussed with the patient's daughter including, but not limited to infection, bleeding, significant bleeding causing loss or decrease in renal function or damage to adjacent structures.   All of the questions were answered, patient is agreeable to proceed. Consent signed and in chart.   Electronically Signed: Murrell Redden PA-C 10/30/2015, 1:13 PM   I spent a total of  15 Minutes in face to face in clinical consultation, greater than 50% of which was counseling/coordinating care for PCN placement.

## 2015-10-30 NOTE — Sedation Documentation (Signed)
Patient is resting comfortably. 

## 2015-11-19 ENCOUNTER — Inpatient Hospital Stay (HOSPITAL_COMMUNITY)
Admission: EM | Admit: 2015-11-19 | Discharge: 2015-12-07 | DRG: 870 | Disposition: E | Payer: Medicare Other | Attending: Pulmonary Disease | Admitting: Pulmonary Disease

## 2015-11-19 ENCOUNTER — Encounter (HOSPITAL_COMMUNITY): Payer: Self-pay | Admitting: Emergency Medicine

## 2015-11-19 ENCOUNTER — Emergency Department (HOSPITAL_COMMUNITY): Payer: Medicare Other

## 2015-11-19 DIAGNOSIS — N179 Acute kidney failure, unspecified: Secondary | ICD-10-CM | POA: Diagnosis present

## 2015-11-19 DIAGNOSIS — N1330 Unspecified hydronephrosis: Secondary | ICD-10-CM | POA: Diagnosis present

## 2015-11-19 DIAGNOSIS — Z936 Other artificial openings of urinary tract status: Secondary | ICD-10-CM

## 2015-11-19 DIAGNOSIS — N186 End stage renal disease: Secondary | ICD-10-CM | POA: Diagnosis present

## 2015-11-19 DIAGNOSIS — R001 Bradycardia, unspecified: Secondary | ICD-10-CM | POA: Diagnosis present

## 2015-11-19 DIAGNOSIS — J189 Pneumonia, unspecified organism: Secondary | ICD-10-CM

## 2015-11-19 DIAGNOSIS — I5032 Chronic diastolic (congestive) heart failure: Secondary | ICD-10-CM | POA: Diagnosis present

## 2015-11-19 DIAGNOSIS — Z930 Tracheostomy status: Secondary | ICD-10-CM

## 2015-11-19 DIAGNOSIS — G40909 Epilepsy, unspecified, not intractable, without status epilepticus: Secondary | ICD-10-CM | POA: Diagnosis present

## 2015-11-19 DIAGNOSIS — C541 Malignant neoplasm of endometrium: Secondary | ICD-10-CM | POA: Diagnosis present

## 2015-11-19 DIAGNOSIS — N390 Urinary tract infection, site not specified: Secondary | ICD-10-CM | POA: Diagnosis present

## 2015-11-19 DIAGNOSIS — Z8673 Personal history of transient ischemic attack (TIA), and cerebral infarction without residual deficits: Secondary | ICD-10-CM

## 2015-11-19 DIAGNOSIS — A0811 Acute gastroenteropathy due to Norwalk agent: Secondary | ICD-10-CM | POA: Diagnosis present

## 2015-11-19 DIAGNOSIS — Y95 Nosocomial condition: Secondary | ICD-10-CM | POA: Diagnosis present

## 2015-11-19 DIAGNOSIS — R532 Functional quadriplegia: Secondary | ICD-10-CM | POA: Diagnosis present

## 2015-11-19 DIAGNOSIS — D62 Acute posthemorrhagic anemia: Secondary | ICD-10-CM | POA: Diagnosis present

## 2015-11-19 DIAGNOSIS — D638 Anemia in other chronic diseases classified elsewhere: Secondary | ICD-10-CM

## 2015-11-19 DIAGNOSIS — R6521 Severe sepsis with septic shock: Secondary | ICD-10-CM | POA: Diagnosis present

## 2015-11-19 DIAGNOSIS — Z931 Gastrostomy status: Secondary | ICD-10-CM | POA: Diagnosis not present

## 2015-11-19 DIAGNOSIS — I959 Hypotension, unspecified: Secondary | ICD-10-CM

## 2015-11-19 DIAGNOSIS — Z888 Allergy status to other drugs, medicaments and biological substances status: Secondary | ICD-10-CM

## 2015-11-19 DIAGNOSIS — Z886 Allergy status to analgesic agent status: Secondary | ICD-10-CM

## 2015-11-19 DIAGNOSIS — Z9689 Presence of other specified functional implants: Secondary | ICD-10-CM | POA: Diagnosis present

## 2015-11-19 DIAGNOSIS — E875 Hyperkalemia: Secondary | ICD-10-CM | POA: Diagnosis not present

## 2015-11-19 DIAGNOSIS — A419 Sepsis, unspecified organism: Principal | ICD-10-CM | POA: Diagnosis present

## 2015-11-19 DIAGNOSIS — E871 Hypo-osmolality and hyponatremia: Secondary | ICD-10-CM | POA: Diagnosis present

## 2015-11-19 DIAGNOSIS — N19 Unspecified kidney failure: Secondary | ICD-10-CM

## 2015-11-19 DIAGNOSIS — J9621 Acute and chronic respiratory failure with hypoxia: Secondary | ICD-10-CM

## 2015-11-19 DIAGNOSIS — L89894 Pressure ulcer of other site, stage 4: Secondary | ICD-10-CM | POA: Diagnosis present

## 2015-11-19 DIAGNOSIS — Z7189 Other specified counseling: Secondary | ICD-10-CM | POA: Insufficient documentation

## 2015-11-19 DIAGNOSIS — I4891 Unspecified atrial fibrillation: Secondary | ICD-10-CM | POA: Diagnosis present

## 2015-11-19 DIAGNOSIS — N178 Other acute kidney failure: Secondary | ICD-10-CM

## 2015-11-19 DIAGNOSIS — L89154 Pressure ulcer of sacral region, stage 4: Secondary | ICD-10-CM | POA: Diagnosis present

## 2015-11-19 DIAGNOSIS — Z79899 Other long term (current) drug therapy: Secondary | ICD-10-CM

## 2015-11-19 DIAGNOSIS — E861 Hypovolemia: Secondary | ICD-10-CM | POA: Diagnosis present

## 2015-11-19 DIAGNOSIS — K219 Gastro-esophageal reflux disease without esophagitis: Secondary | ICD-10-CM | POA: Diagnosis present

## 2015-11-19 DIAGNOSIS — I469 Cardiac arrest, cause unspecified: Secondary | ICD-10-CM

## 2015-11-19 DIAGNOSIS — Z9911 Dependence on respirator [ventilator] status: Secondary | ICD-10-CM | POA: Diagnosis not present

## 2015-11-19 DIAGNOSIS — E872 Acidosis: Secondary | ICD-10-CM | POA: Diagnosis present

## 2015-11-19 DIAGNOSIS — R1310 Dysphagia, unspecified: Secondary | ICD-10-CM | POA: Diagnosis present

## 2015-11-19 DIAGNOSIS — Z515 Encounter for palliative care: Secondary | ICD-10-CM | POA: Insufficient documentation

## 2015-11-19 DIAGNOSIS — I7100 Dissection of unspecified site of aorta: Secondary | ICD-10-CM | POA: Diagnosis present

## 2015-11-19 DIAGNOSIS — R4020 Unspecified coma: Secondary | ICD-10-CM | POA: Diagnosis not present

## 2015-11-19 DIAGNOSIS — Z66 Do not resuscitate: Secondary | ICD-10-CM | POA: Diagnosis present

## 2015-11-19 DIAGNOSIS — L89610 Pressure ulcer of right heel, unstageable: Secondary | ICD-10-CM | POA: Diagnosis present

## 2015-11-19 DIAGNOSIS — L89510 Pressure ulcer of right ankle, unstageable: Secondary | ICD-10-CM | POA: Diagnosis present

## 2015-11-19 LAB — I-STAT ARTERIAL BLOOD GAS, ED
ACID-BASE DEFICIT: 7 mmol/L — AB (ref 0.0–2.0)
Acid-base deficit: 8 mmol/L — ABNORMAL HIGH (ref 0.0–2.0)
BICARBONATE: 22.6 meq/L (ref 20.0–24.0)
BICARBONATE: 20.7 meq/L (ref 20.0–24.0)
O2 SAT: 96 %
O2 Saturation: 61 %
PCO2 ART: 62.8 mmHg — AB (ref 35.0–45.0)
PO2 ART: 106 mmHg — AB (ref 80.0–100.0)
PO2 ART: 42 mmHg — AB (ref 80.0–100.0)
Patient temperature: 98.6
Patient temperature: 98.6
TCO2: 25 mmol/L (ref 0–100)
TCO2: 23 mmol/L (ref 0–100)
pCO2 arterial: 68.3 mmHg (ref 35.0–45.0)
pH, Arterial: 7.127 — CL (ref 7.350–7.450)
pH, Arterial: 7.127 — CL (ref 7.350–7.450)

## 2015-11-19 LAB — COMPREHENSIVE METABOLIC PANEL
ALBUMIN: 1.2 g/dL — AB (ref 3.5–5.0)
ALT: 85 U/L — ABNORMAL HIGH (ref 14–54)
ANION GAP: 19 — AB (ref 5–15)
AST: 58 U/L — AB (ref 15–41)
Alkaline Phosphatase: 274 U/L — ABNORMAL HIGH (ref 38–126)
BILIRUBIN TOTAL: 0.5 mg/dL (ref 0.3–1.2)
CO2: 22 mmol/L (ref 22–32)
Calcium: 9.6 mg/dL (ref 8.9–10.3)
Chloride: 74 mmol/L — ABNORMAL LOW (ref 101–111)
Creatinine, Ser: 8.58 mg/dL — ABNORMAL HIGH (ref 0.44–1.00)
GFR calc Af Amer: 5 mL/min — ABNORMAL LOW (ref >60)
GFR calc non Af Amer: 4 mL/min — ABNORMAL LOW (ref >60)
GLUCOSE: 74 mg/dL (ref 65–99)
POTASSIUM: 6.4 mmol/L — AB (ref 3.5–5.1)
SODIUM: 115 mmol/L — AB (ref 135–145)
TOTAL PROTEIN: 5.7 g/dL — AB (ref 6.5–8.1)

## 2015-11-19 LAB — CBC WITH DIFFERENTIAL/PLATELET
BASOS ABS: 0 10*3/uL (ref 0.0–0.1)
Basophils Relative: 0 %
Eosinophils Absolute: 0 10*3/uL (ref 0.0–0.7)
Eosinophils Relative: 0 %
HEMATOCRIT: 16.4 % — AB (ref 36.0–46.0)
Hemoglobin: 5.6 g/dL — CL (ref 12.0–15.0)
LYMPHS ABS: 0.1 10*3/uL — AB (ref 0.7–4.0)
LYMPHS PCT: 2 %
MCH: 25.9 pg — AB (ref 26.0–34.0)
MCHC: 34.1 g/dL (ref 30.0–36.0)
MCV: 75.9 fL — ABNORMAL LOW (ref 78.0–100.0)
MONO ABS: 0.1 10*3/uL (ref 0.1–1.0)
Monocytes Relative: 2 %
NEUTROS ABS: 5.6 10*3/uL (ref 1.7–7.7)
Neutrophils Relative %: 96 %
Platelets: 97 10*3/uL — ABNORMAL LOW (ref 150–400)
RBC: 2.16 MIL/uL — AB (ref 3.87–5.11)
RDW: 17.4 % — AB (ref 11.5–15.5)
WBC: 5.8 10*3/uL (ref 4.0–10.5)

## 2015-11-19 LAB — I-STAT TROPONIN, ED: TROPONIN I, POC: 0.33 ng/mL — AB (ref 0.00–0.08)

## 2015-11-19 LAB — CBC
HCT: 16.7 % — ABNORMAL LOW (ref 36.0–46.0)
HEMATOCRIT: 18.3 % — AB (ref 36.0–46.0)
HEMOGLOBIN: 5.6 g/dL — AB (ref 12.0–15.0)
HEMOGLOBIN: 6.2 g/dL — AB (ref 12.0–15.0)
MCH: 25.7 pg — AB (ref 26.0–34.0)
MCH: 25.7 pg — ABNORMAL LOW (ref 26.0–34.0)
MCHC: 33.5 g/dL (ref 30.0–36.0)
MCHC: 33.9 g/dL (ref 30.0–36.0)
MCV: 76.6 fL — ABNORMAL LOW (ref 78.0–100.0)
MCV: 75.9 fL — AB (ref 78.0–100.0)
PLATELETS: 107 10*3/uL — AB (ref 150–400)
Platelets: 123 10*3/uL — ABNORMAL LOW (ref 150–400)
RBC: 2.18 MIL/uL — AB (ref 3.87–5.11)
RBC: 2.41 MIL/uL — AB (ref 3.87–5.11)
RDW: 17.6 % — ABNORMAL HIGH (ref 11.5–15.5)
RDW: 17.4 % — ABNORMAL HIGH (ref 11.5–15.5)
WBC: 19.5 10*3/uL — AB (ref 4.0–10.5)
WBC: 26.2 10*3/uL — AB (ref 4.0–10.5)

## 2015-11-19 LAB — BASIC METABOLIC PANEL
ANION GAP: 20 — AB (ref 5–15)
Anion gap: 20 — ABNORMAL HIGH (ref 5–15)
BUN: 293 mg/dL — ABNORMAL HIGH (ref 6–20)
BUN: 300 mg/dL — ABNORMAL HIGH (ref 6–20)
CALCIUM: 8.2 mg/dL — AB (ref 8.9–10.3)
CALCIUM: 8 mg/dL — AB (ref 8.9–10.3)
CHLORIDE: 74 mmol/L — AB (ref 101–111)
CO2: 20 mmol/L — AB (ref 22–32)
CO2: 20 mmol/L — ABNORMAL LOW (ref 22–32)
CREATININE: 8.46 mg/dL — AB (ref 0.44–1.00)
CREATININE: 8.53 mg/dL — AB (ref 0.44–1.00)
Chloride: 76 mmol/L — ABNORMAL LOW (ref 101–111)
GFR calc non Af Amer: 4 mL/min — ABNORMAL LOW (ref >60)
GFR, EST AFRICAN AMERICAN: 5 mL/min — AB (ref >60)
GFR, EST AFRICAN AMERICAN: 5 mL/min — AB (ref >60)
GFR, EST NON AFRICAN AMERICAN: 4 mL/min — AB (ref >60)
Glucose, Bld: 78 mg/dL (ref 65–99)
Glucose, Bld: 116 mg/dL — ABNORMAL HIGH (ref 65–99)
Potassium: 6.4 mmol/L (ref 3.5–5.1)
Potassium: 6.4 mmol/L (ref 3.5–5.1)
SODIUM: 116 mmol/L — AB (ref 135–145)
SODIUM: 114 mmol/L — AB (ref 135–145)

## 2015-11-19 LAB — MAGNESIUM: MAGNESIUM: 3 mg/dL — AB (ref 1.7–2.4)

## 2015-11-19 LAB — URINALYSIS, ROUTINE W REFLEX MICROSCOPIC
Bilirubin Urine: NEGATIVE
GLUCOSE, UA: NEGATIVE mg/dL
Ketones, ur: NEGATIVE mg/dL
Nitrite: POSITIVE — AB
PH: 8.5 — AB (ref 5.0–8.0)
Protein, ur: >300 mg/dL — AB
Specific Gravity, Urine: 1.024 (ref 1.005–1.030)

## 2015-11-19 LAB — GASTROINTESTINAL PANEL BY PCR, STOOL (REPLACES STOOL CULTURE)
ADENOVIRUS F40/41: NOT DETECTED
Astrovirus: NOT DETECTED
CAMPYLOBACTER SPECIES: NOT DETECTED
CRYPTOSPORIDIUM: NOT DETECTED
Cyclospora cayetanensis: NOT DETECTED
E. coli O157: NOT DETECTED
ENTEROAGGREGATIVE E COLI (EAEC): NOT DETECTED
Entamoeba histolytica: NOT DETECTED
Enteropathogenic E coli (EPEC): NOT DETECTED
Enterotoxigenic E coli (ETEC): NOT DETECTED
GIARDIA LAMBLIA: NOT DETECTED
Norovirus GI/GII: DETECTED — AB
PLESIMONAS SHIGELLOIDES: NOT DETECTED
Rotavirus A: NOT DETECTED
Salmonella species: NOT DETECTED
Sapovirus (I, II, IV, and V): NOT DETECTED
Shiga like toxin producing E coli (STEC): NOT DETECTED
Shigella/Enteroinvasive E coli (EIEC): NOT DETECTED
VIBRIO CHOLERAE: NOT DETECTED
Vibrio species: NOT DETECTED
Yersinia enterocolitica: NOT DETECTED

## 2015-11-19 LAB — I-STAT CHEM 8, ED
CALCIUM ION: 1.11 mmol/L — AB (ref 1.13–1.30)
CHLORIDE: 77 mmol/L — AB (ref 101–111)
Creatinine, Ser: 8.4 mg/dL — ABNORMAL HIGH (ref 0.44–1.00)
GLUCOSE: 71 mg/dL (ref 65–99)
HCT: 17 % — ABNORMAL LOW (ref 36.0–46.0)
Hemoglobin: 5.8 g/dL — CL (ref 12.0–15.0)
Potassium: 6.2 mmol/L (ref 3.5–5.1)
Sodium: 112 mmol/L — CL (ref 135–145)
TCO2: 23 mmol/L (ref 0–100)

## 2015-11-19 LAB — GLUCOSE, CAPILLARY
GLUCOSE-CAPILLARY: 68 mg/dL (ref 65–99)
GLUCOSE-CAPILLARY: 71 mg/dL (ref 65–99)
GLUCOSE-CAPILLARY: 96 mg/dL (ref 65–99)
GLUCOSE-CAPILLARY: 118 mg/dL — AB (ref 65–99)

## 2015-11-19 LAB — URINE MICROSCOPIC-ADD ON

## 2015-11-19 LAB — TROPONIN I: Troponin I: 0.42 ng/mL — ABNORMAL HIGH (ref <0.031)

## 2015-11-19 LAB — PROCALCITONIN: Procalcitonin: 19.48 ng/mL

## 2015-11-19 LAB — LACTIC ACID, PLASMA: Lactic Acid, Venous: 3.6 mmol/L (ref 0.5–2.0)

## 2015-11-19 LAB — CBG MONITORING, ED: Glucose-Capillary: 78 mg/dL (ref 65–99)

## 2015-11-19 LAB — PHOSPHORUS: Phosphorus: 9.1 mg/dL — ABNORMAL HIGH (ref 2.5–4.6)

## 2015-11-19 LAB — MRSA PCR SCREENING: MRSA by PCR: NEGATIVE

## 2015-11-19 LAB — PREPARE RBC (CROSSMATCH)

## 2015-11-19 LAB — I-STAT CG4 LACTIC ACID, ED: Lactic Acid, Venous: 1.61 mmol/L (ref 0.5–2.0)

## 2015-11-19 MED ORDER — INSULIN ASPART 100 UNIT/ML ~~LOC~~ SOLN
5.0000 [IU] | Freq: Once | SUBCUTANEOUS | Status: AC
  Administered 2015-11-19: 5 [IU] via INTRAVENOUS
  Filled 2015-11-19: qty 1

## 2015-11-19 MED ORDER — STERILE WATER FOR INJECTION IV SOLN
INTRAVENOUS | Status: DC
  Administered 2015-11-19: 08:00:00 via INTRAVENOUS
  Filled 2015-11-19 (×2): qty 850

## 2015-11-19 MED ORDER — SODIUM POLYSTYRENE SULFONATE 15 GM/60ML PO SUSP
30.0000 g | Freq: Once | ORAL | Status: AC
  Administered 2015-11-19: 30 g via RECTAL
  Filled 2015-11-19: qty 120

## 2015-11-19 MED ORDER — ANTISEPTIC ORAL RINSE SOLUTION (CORINZ)
7.0000 mL | Freq: Four times a day (QID) | OROMUCOSAL | Status: DC
  Administered 2015-11-19 – 2015-11-24 (×20): 7 mL via OROMUCOSAL

## 2015-11-19 MED ORDER — EPINEPHRINE HCL 0.1 MG/ML IJ SOSY
PREFILLED_SYRINGE | INTRAMUSCULAR | Status: DC | PRN
  Administered 2015-11-19: 1 via INTRAVENOUS
  Administered 2015-11-19: 1 mg via INTRAVENOUS

## 2015-11-19 MED ORDER — DEXTROSE 5 % IV SOLN
1.0000 g | INTRAVENOUS | Status: DC
Start: 2015-11-20 — End: 2015-11-21
  Administered 2015-11-20 – 2015-11-21 (×2): 1 g via INTRAVENOUS
  Filled 2015-11-19 (×2): qty 1

## 2015-11-19 MED ORDER — SODIUM BICARBONATE 8.4 % IV SOLN
100.0000 meq | Freq: Once | INTRAVENOUS | Status: AC
  Administered 2015-11-19: 100 meq via INTRAVENOUS

## 2015-11-19 MED ORDER — SODIUM BICARBONATE 8.4 % IV SOLN
INTRAVENOUS | Status: AC
  Filled 2015-11-19: qty 100

## 2015-11-19 MED ORDER — CHLORHEXIDINE GLUCONATE 0.12% ORAL RINSE (MEDLINE KIT)
15.0000 mL | Freq: Two times a day (BID) | OROMUCOSAL | Status: DC
  Administered 2015-11-19 – 2015-11-24 (×10): 15 mL via OROMUCOSAL

## 2015-11-19 MED ORDER — SODIUM CHLORIDE 0.9 % IV SOLN
Freq: Once | INTRAVENOUS | Status: AC
  Administered 2015-11-19: 08:00:00 via INTRAVENOUS

## 2015-11-19 MED ORDER — DEXTROSE 5 % IV SOLN
2.0000 g | Freq: Once | INTRAVENOUS | Status: AC
  Administered 2015-11-19: 2 g via INTRAVENOUS
  Filled 2015-11-19: qty 2

## 2015-11-19 MED ORDER — FUROSEMIDE 10 MG/ML IJ SOLN
40.0000 mg | Freq: Once | INTRAMUSCULAR | Status: DC
  Filled 2015-11-19: qty 4

## 2015-11-19 MED ORDER — SODIUM CHLORIDE 0.9 % IV SOLN: 250.0000 mL | INTRAVENOUS | Status: DC | PRN

## 2015-11-19 MED ORDER — SODIUM CHLORIDE 0.9 % IV SOLN
1.0000 g | Freq: Once | INTRAVENOUS | Status: AC
  Administered 2015-11-19: 1 g via INTRAVENOUS
  Filled 2015-11-19: qty 10

## 2015-11-19 MED ORDER — SODIUM CHLORIDE 0.9 % IV SOLN
INTRAVENOUS | Status: DC | PRN
  Administered 2015-11-19: 1000 mL via INTRAVENOUS

## 2015-11-19 MED ORDER — SODIUM POLYSTYRENE SULFONATE 15 GM/60ML PO SUSP
30.0000 g | Freq: Once | ORAL | Status: AC
  Administered 2015-11-20: 30 g
  Filled 2015-11-19: qty 120

## 2015-11-19 MED ORDER — CALCIUM CHLORIDE 10 % IV SOLN
INTRAVENOUS | Status: DC | PRN
  Administered 2015-11-19 (×2): 100 meq via INTRAVENOUS

## 2015-11-19 MED ORDER — VANCOMYCIN HCL 10 G IV SOLR
2000.0000 mg | Freq: Once | INTRAVENOUS | Status: AC
  Administered 2015-11-19: 2000 mg via INTRAVENOUS
  Filled 2015-11-19: qty 2000

## 2015-11-19 MED ORDER — PANTOPRAZOLE SODIUM 40 MG IV SOLR
40.0000 mg | Freq: Every day | INTRAVENOUS | Status: DC
  Administered 2015-11-19 – 2015-11-20 (×2): 40 mg via INTRAVENOUS
  Filled 2015-11-19 (×2): qty 40

## 2015-11-19 MED ORDER — DOPAMINE-DEXTROSE 3.2-5 MG/ML-% IV SOLN
0.0000 ug/kg/min | INTRAVENOUS | Status: DC
  Administered 2015-11-19: 10 ug/kg/min via INTRAVENOUS
  Administered 2015-11-20 – 2015-11-22 (×9): 20 ug/kg/min via INTRAVENOUS
  Administered 2015-11-22: 16 ug/kg/min via INTRAVENOUS
  Filled 2015-11-19 (×11): qty 250

## 2015-11-19 MED ORDER — DEXTROSE 5 % IV SOLN
0.5000 ug/min | INTRAVENOUS | Status: DC
  Administered 2015-11-19: 0.5 ug/min via INTRAVENOUS
  Administered 2015-11-20: 18 ug/min via INTRAVENOUS
  Administered 2015-11-20: 10 ug/min via INTRAVENOUS
  Administered 2015-11-20: 20 ug/min via INTRAVENOUS
  Administered 2015-11-20: 18 ug/min via INTRAVENOUS
  Administered 2015-11-20: 16 ug/min via INTRAVENOUS
  Administered 2015-11-20: 18 ug/min via INTRAVENOUS
  Administered 2015-11-21 (×4): 12 ug/min via INTRAVENOUS
  Administered 2015-11-22: 10 ug/min via INTRAVENOUS
  Filled 2015-11-19 (×14): qty 4

## 2015-11-19 MED ORDER — SODIUM CHLORIDE 0.9% FLUSH: 10.0000 mL | INTRAVENOUS | Status: DC | PRN

## 2015-11-19 MED ORDER — CALCIUM CHLORIDE 10 % IV SOLN
INTRAVENOUS | Status: AC
  Filled 2015-11-19: qty 10

## 2015-11-19 MED ORDER — VALPROATE SODIUM 250 MG/5ML PO SYRP
125.0000 mg | ORAL_SOLUTION | Freq: Two times a day (BID) | ORAL | Status: DC
  Administered 2015-11-19 – 2015-11-24 (×10): 125 mg
  Filled 2015-11-19 (×12): qty 2.5

## 2015-11-19 MED ORDER — SODIUM BICARBONATE 8.4 % IV SOLN
INTRAVENOUS | Status: DC | PRN
  Administered 2015-11-19 (×2): 50 meq via INTRAVENOUS
  Administered 2015-11-19 (×2): 25 meq via INTRAVENOUS

## 2015-11-19 MED ORDER — EPINEPHRINE HCL 0.1 MG/ML IJ SOSY: 1.0000 | PREFILLED_SYRINGE | Freq: Once | INTRAMUSCULAR | Status: DC

## 2015-11-19 MED ORDER — VANCOMYCIN HCL IN DEXTROSE 1-5 GM/200ML-% IV SOLN: 1000.0000 mg | Freq: Once | INTRAVENOUS | Status: DC

## 2015-11-19 MED ORDER — DEXTROSE 50 % IV SOLN
INTRAVENOUS | Status: DC | PRN
  Administered 2015-11-19: 1 via INTRAVENOUS

## 2015-11-19 MED ORDER — SODIUM CHLORIDE 0.9 % IV SOLN
INTRAVENOUS | Status: DC
  Administered 2015-11-19 – 2015-11-21 (×4): via INTRAVENOUS

## 2015-11-19 MED ORDER — VANCOMYCIN HCL IN DEXTROSE 1-5 GM/200ML-% IV SOLN
1000.0000 mg | INTRAVENOUS | Status: DC
  Filled 2015-11-19: qty 200

## 2015-11-19 MED ORDER — ALBUTEROL SULFATE (2.5 MG/3ML) 0.083% IN NEBU: 2.5000 mg | INHALATION_SOLUTION | RESPIRATORY_TRACT | Status: DC | PRN

## 2015-11-19 MED ORDER — INSULIN ASPART 100 UNIT/ML ~~LOC~~ SOLN: 0.0000 [IU] | SUBCUTANEOUS | Status: DC

## 2015-11-19 MED ORDER — STERILE WATER FOR INJECTION IV SOLN
INTRAVENOUS | Status: DC
  Administered 2015-11-19 – 2015-11-20 (×2): via INTRAVENOUS
  Filled 2015-11-19 (×4): qty 850

## 2015-11-19 NOTE — Progress Notes (Signed)
Dr. Halford Chessman advised of patient positive test for Norovirus.

## 2015-11-19 NOTE — Progress Notes (Signed)
CRITICAL VALUE ALERT  Critical value received:  Na 114 and K 6.4  Date of notification:  11/26/2015  Time of notification:  2307  Critical value read back:Yes.    Nurse who received alert:  Zakia Sainato Thompson  MD notified (1st page):  Halford Chessman  Time of first page:  2307  MD notified (2nd page):  Time of second page:  Responding MD:  Halford Chessman  Time MD responded:  2315

## 2015-11-19 NOTE — ED Notes (Signed)
Pt arrives via [facility] from [facility], pt was sent out for drainage from PEG tube but patient noted to be bradycardic, hypotensive and hypothermic. Pt has hx of stroke causing quadriplegia, seizure disorder, vent dependent on trach. PICC R arm appears grossly infected. Foley, R pleural evac, PEG, nephrostomy, trach in place.

## 2015-11-19 NOTE — Progress Notes (Signed)
Critical lab values reported to Dr. Elsworth Soho Kcl-6.4, Lactic acid 3.6, Sodium 116, Hcg 5.6.

## 2015-11-19 NOTE — Progress Notes (Signed)
Family at bedside advised that patient has tested positive for Norovirus. Daughter has child at bedside, I advised her that as a nurse I do not recommend that a child be present in this room. Family declined to remove her child from the room. I advised her that she should then be sure to wash her child's hands as well as everyone else in the room. Family did not take my advice as to protecting themselves from Norovirus and only washed hands and placed gloves on after I insisted for safety that they wash hands and don gloves. Child remains in the room against nursing advice.

## 2015-11-19 NOTE — ED Provider Notes (Addendum)
CSN: [id number]     Arrival date & time 11/22/2015  8101 History   First MD Initiated Contact with Patient 11/30/2015 [phone]     Chief Complaint  Patient presents with  . Bradycardia     (Consider location/radiation/quality/duration/timing/severity/associated sxs/prior Treatment) HPI Comments: Patient presents with a complex medical history. She arrives from [facility]. 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 She is a quadriplegic from prior stroke. She has atrial fibrillation but it don't see that she is on anticoagulants. She has a history of CHF and aortic dissection. She's on medical management for her dissection as she is not a surgical candidate. She is currently on labetalol as well as her amiodarone. She presented with possible bleeding from her PEG. However when she arrived it was noted she was markedly bradycardic with a heart rate in the 30s with a wide QRS. History is limited due to her nonverbal state. There is no family here with the patient. It does say on her notes that she is a full code per family request.   Past Medical History  Diagnosis Date  . Ventilator dependent ([place])   . Atrial fibrillation ([place])   . Seizures ([place])   . Pleural effusion   . Renal disorder   . Stroke ([place])   . Quadriplegia, functional ([place])   . CHF (congestive heart failure) ([place])   . Hyperthyroidism   . Anemia   . GERD (gastroesophageal reflux disease)    Past Surgical History  Procedure Laterality Date  . Abdominal surgery     No family history on file. Social History  Substance Use Topics  . Smoking status: Never Smoker   . Smokeless tobacco: Never Used     Comment: ujnknown smoking history  . Alcohol Use: No   OB History    No data available     Review of Systems  Unable to perform ROS: Acuity of condition      Allergies  Aspirin; Heparin; and Lorazepam  Home Medications   Prior to Admission medications   Medication Sig Start Date End Date Taking?  Authorizing Provider  acetaminophen (TYLENOL) 160 MG/5ML solution Place 20.3 mLs (650 mg total) into feeding tube every 4 (four) hours as needed for mild pain, headache or fever. 10/10/15   Cherene Altes, MD  amiodarone (PACERONE) 200 MG tablet Place 1 tablet (200 mg total) into feeding tube daily. 10/10/15   Cherene Altes, MD  amLODipine (NORVASC) 10 MG tablet Place 1 tablet (10 mg total) into feeding tube daily. 10/10/15   Cherene Altes, MD  chlorhexidine gluconate, SAGE KIT, (PERIDEX) 0.12 % solution 15 mLs by Mouth Rinse route 2 (two) times daily. 10/10/15   Cherene Altes, MD  collagenase (SANTYL) ointment Apply topically daily. 10/10/15   Cherene Altes, MD  collagenase (SANTYL) ointment Apply topically daily. 10/10/15   Cherene Altes, MD  Darbepoetin Alfa (ARANESP) 60 MCG/0.3ML SOSY injection Inject 0.3 mLs (60 mcg total) into the skin every Thursday at 6pm. 10/10/15   Cherene Altes, MD  diazepam (VALIUM) 5 MG/ML injection Inject 0.5 mLs (2.5 mg total) into the vein every 4 (four) hours as needed. 10/10/15   Cherene Altes, MD  labetalol (NORMODYNE) 100 MG tablet Place 1 tablet (100 mg total) into feeding tube 3 (three) times daily. 10/10/15   Cherene Altes, MD  levalbuterol Penne Lash) 0.63 MG/3ML nebulizer solution Take 3 mLs (0.63 mg total) by nebulization every 3 (three) hours as needed  for wheezing. 10/10/15   Cherene Altes, MD  Nutritional Supplements (FEEDING SUPPLEMENT, VITAL AF 1.2 CAL,) LIQD Place 1,000 mLs into feeding tube continuous. 10/10/15   Cherene Altes, MD  oxyCODONE (ROXICODONE) 5 MG/5ML solution Place 5-10 mLs (5-10 mg total) into feeding tube every 2 (two) hours as needed for moderate pain. 10/10/15   Cherene Altes, MD  pantoprazole sodium (PROTONIX) 40 mg/20 mL PACK Place 20 mLs (40 mg total) into feeding tube 2 (two) times daily. 10/10/15   Cherene Altes, MD  valproic acid (DEPAKENE) 250 MG/5ML syrup Place 2.5 mLs (125 mg total) into feeding tube 2  (two) times daily. 10/10/15   Cherene Altes, MD  Water For Irrigation, Sterile (FREE WATER) SOLN Place 200 mLs into feeding tube every 8 (eight) hours. 10/10/15   Cherene Altes, MD   BP 90/75 mmHg  Pulse 59  Temp(Src) 96.7 F (35.9 C) (Rectal)  Resp 8  SpO2 92% Physical Exam  Constitutional: She is oriented to person, place, and time. She appears well-developed and well-nourished.  Obese with marked generalized edema  HENT:  Head: Normocephalic and atraumatic.  Eyes: Pupils are equal, round, and reactive to light.  Neck: Normal range of motion. Neck supple.  Tracheostomy in place  Cardiovascular: Normal rate, regular rhythm and normal heart sounds.   Pulmonary/Chest: Effort normal. No respiratory distress. She has wheezes. She has rales. She exhibits no tenderness.  Patient has a PEG in place as well as a Pleur-evac on the right and a left-sided nephrostomy tube  Abdominal: Soft. Bowel sounds are normal. There is no tenderness. There is no rebound and no guarding.  Musculoskeletal: Normal range of motion. She exhibits no edema.  Diffuse edema bilaterally  Lymphadenopathy:    She has no cervical adenopathy.  Neurological: She is alert and oriented to person, place, and time.  Skin: Skin is warm and dry. No rash noted.  Large deep decubitus ulcer over her sacrum  Psychiatric: She has a normal mood and affect.    ED Course  Procedures (including critical care time) Labs Review Labs Reviewed  COMPREHENSIVE METABOLIC PANEL - Abnormal; Notable for the following:    Sodium 115 (*)    Potassium 6.4 (*)    Chloride 74 (*)    BUN >300 (*)    Creatinine, Ser 8.58 (*)    Total Protein 5.7 (*)    Albumin 1.2 (*)    AST 58 (*)    ALT 85 (*)    Alkaline Phosphatase 274 (*)    GFR calc non Af Amer 4 (*)    GFR calc Af Amer 5 (*)    Anion gap 19 (*)    All other components within normal limits  CBC WITH DIFFERENTIAL/PLATELET - Abnormal; Notable for the following:    RBC 2.16 (*)     Hemoglobin 5.6 (*)    HCT 16.4 (*)    MCV 75.9 (*)    MCH 25.9 (*)    RDW 17.4 (*)    Platelets 97 (*)    Lymphs Abs 0.1 (*)    All other components within normal limits  URINALYSIS, ROUTINE W REFLEX MICROSCOPIC (NOT AT [facility]) - Abnormal; Notable for the following:    Color, Urine BROWN (*)    APPearance TURBID (*)    pH 8.5 (*)    Hgb urine dipstick LARGE (*)    Protein, ur >300 (*)    Nitrite POSITIVE (*)    Leukocytes, UA LARGE (*)  All other components within normal limits  URINE MICROSCOPIC-ADD ON - Abnormal; Notable for the following:    Squamous Epithelial / LPF 0-5 (*)    Bacteria, UA MANY (*)    All other components within normal limits  I-STAT ARTERIAL BLOOD GAS, ED - Abnormal; Notable for the following:    pH, Arterial 7.127 (*)    pCO2 arterial 68.3 (*)    pO2, Arterial 106.0 (*)    Acid-base deficit 7.0 (*)    All other components within normal limits  I-STAT CHEM 8, ED - Abnormal; Notable for the following:    Sodium 112 (*)    Potassium 6.2 (*)    Chloride 77 (*)    BUN >140 (*)    Creatinine, Ser 8.40 (*)    Calcium, Ion 1.11 (*)    Hemoglobin 5.8 (*)    HCT 17.0 (*)    All other components within normal limits  I-STAT TROPOININ, ED - Abnormal; Notable for the following:    Troponin i, poc 0.33 (*)    All other components within normal limits  CULTURE, BLOOD (ROUTINE X 2)  CULTURE, BLOOD (ROUTINE X 2)  URINE CULTURE  GASTROINTESTINAL PANEL BY PCR, STOOL (REPLACES STOOL CULTURE)  CBG MONITORING, ED  I-STAT CG4 LACTIC ACID, ED  TYPE AND SCREEN  PREPARE RBC (CROSSMATCH)    Imaging Review Dg Chest Portable 1 View  11/28/2015  CLINICAL DATA:  Central line placement. EXAM: PORTABLE CHEST 1 VIEW COMPARISON:  09/28/2015 FINDINGS: Patient slightly rotated to the right. Tracheostomy tube and small caliber right sided pleural catheter unchanged. Metallic device with associated catheter projected over the mid thoracic spine. Interval placement of right IJ  central venous catheter with tip overlying the SVC. Lungs are adequately inflated demonstrate persistent airspace opacification of the left mid upper lung without significant change. Mild prominence of the perihilar vasculature unchanged. Minimal right base opacification slightly worse. No definite effusion. No right-sided pneumothorax. Cardiomediastinal silhouette is prominent but unchanged. Stable widened mediastinum due in part to patient's known ascending aortic aneurysm and dissection. Remainder the exam is unchanged. IMPRESSION: Persistent opacification over the left mid to upper lung which may be due to infection. Slight worsening right base opacification which may be due to atelectasis or infection. Cardiomegaly and suggestion of mild vascular congestion. Stable widened mediastinum due in part to known ascending aortic aneurysm/ dissection. Tubes and lines as described. Electronically Signed   By: Marin Olp M.D.   On: 11/21/2015 07:31   I have personally reviewed and evaluated these images and lab results as part of my medical decision-making.   EKG Interpretation   Date/Time:  Sunday Nov 19 2015 06:58:22 EDT Ventricular Rate:  77 PR Interval:    QRS Duration: 76 QT Interval:  422 QTC Calculation: 477 R Axis:   -14 Text Interpretation:  Accelerated Junctional rhythm Low voltage QRS  Possible Inferior infarct , age undetermined Cannot rule out Anterior  infarct , age undetermined Marked ST abnormality, possible lateral  subendocardial injury Abnormal ECG Confirmed by Leiby Pigeon  MD, Kadesia Robel ([id number])  on 11/18/2015 8:48:17 AM      MDM   Final diagnoses:  Junctional (nodal) bradycardia  Acute renal failure with other specified pathological lesion in kidney (HCC)  Hyperkalemia  Hyponatremia  Anemia in other chronic diseases classified elsewhere  HCAP (healthcare-associated pneumonia)  Hypotension, unspecified hypotension type    On arrival, patient is noted to be markedly  bradycardic with a wide QRS. She was given calcium and sodium  bicarbonate as well as insulin and D50. Her QRS did narrow somewhat following this. However it widened again and these medications were repeated. She was started on bicarbonate drip. Her labs showed that she was in acute renal failure with a creatinine of 8 and a potassium of 6.2. She has chronic anemia but her hemoglobin today is lower than her baseline values. She was type and cross for 2 units of packed red cells. The lab is informed us that he has antibodies and they have to order the blood from an outside lab. She doesn't appear to have active bleeding.  She is intermittently become more bradycardic and hypotensive. She has gone into an agonal rhythm with hypotension at times and twice has been given a milligram of epinephrine.  She does have probable pneumonia on x-ray. Her lactate is normal in code sepsis was not called. However she has been given IV fluids as well as antibiotics. She has a prior history of MRSA pneumonia. She was given cefepime and vancomycin. CCM and nephrology have both been consulted and are coming to see the patient.  9:44 Pt with again drop in HR to 30s, BP undetectable, given another mg of epi, does have cardiac activity on [place].  Will start epi drip  9:50 attempted to call son, but number was wrong.  10:00  CCM here to see pt.  CRITICAL CARE Performed by: Shahidah Nesbitt Total critical care time: 90 minutes Critical care time was exclusive of separately billable procedures and treating other patients. Critical care was necessary to treat or prevent imminent or life-threatening deterioration. Critical care was time spent personally by me on the following activities: development of treatment plan with patient and/or surrogate as well as nursing, discussions with consultants, evaluation of patient's response to treatment, examination of patient, obtaining history from patient or surrogate, ordering and performing  treatments and interventions, ordering and review of laboratory studies, ordering and review of radiographic studies, pulse oximetry and re-evaluation of patient's condition.   Malvin Johns, MD 11/12/2015 [place], MD 11/13/2015 1014

## 2015-11-19 NOTE — ED Notes (Signed)
Bite block placed by respiratory.  Pt biting down on tongue causing mild bleeding to mouth.  Pt suctioned prior to bite block being placed.

## 2015-11-19 NOTE — Progress Notes (Signed)
Blood bank called to advise that blood will not be available until late afternoon or evening.

## 2015-11-19 NOTE — Progress Notes (Signed)
RUA PICC d/c'ed per order due to suspected infection.  CVC obtained for other intravenous access.  Site with enlarged opening noted with skin discoloration and purulent drainage present. Family at bedside aware of intervention.

## 2015-11-19 NOTE — ED Notes (Signed)
Critical care at bedside  

## 2015-11-19 NOTE — H&P (Signed)
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 She has complicated past medical history as below, which includes seizure disorder, multiple CVAs, atrial fibrillation, chronic kidney disease, sacral decubitus, R sided pleural effusion with Pleur-X in place, diastolic CHF, and L hydronephrosis with chronic nephrostomy tube (recently replaced after being dislodged), severe chronic anemia and un-stageable sacral and heel decubs.    She presented [facility]/14 from [facility] with reports of bleeding from PEG site.  On arrival to ER she was found to be markedly bradycardic with wide QRS and rate 30's, severe hypotension, acute on chronic renal failure, hyperkalemia, purulent PEG and decub drainage.    She was treated with epi, kayexalate, calcium, insulin/D50 and HCO3 pushes.  On my exam she remains bradycardic, hypotensive on epi gtt.    PAST MEDICAL HISTORY :  She  has a past medical history of Ventilator dependent ([place]); Atrial fibrillation ([place]); Seizures ([place]); Pleural effusion; Renal disorder; Stroke [facility]); Quadriplegia, functional ([place]); CHF (congestive heart failure) ([place]); Hyperthyroidism; Anemia; and GERD (gastroesophageal reflux disease).  PAST SURGICAL HISTORY: She  has past surgical history that includes Abdominal surgery.  Allergies  Allergen Reactions  . Aspirin   . Heparin   . Lorazepam     No current facility-administered medications on file prior to encounter.   Current Outpatient Prescriptions on File Prior to Encounter  Medication Sig  . acetaminophen (TYLENOL) 160 MG/5ML solution Place 20.3 mLs (650 mg total) into feeding tube every 4 (four) hours as needed for mild pain, headache or fever.  Marland Kitchen amiodarone (PACERONE) 200 MG  tablet Place 1 tablet (200 mg total) into feeding tube daily.  Marland Kitchen amLODipine (NORVASC) 10 MG tablet Place 1 tablet (10 mg total) into feeding tube daily.  . chlorhexidine gluconate, SAGE KIT, (PERIDEX) 0.12 % solution 15 mLs by Mouth Rinse route 2 (two) times daily.  . collagenase (SANTYL) ointment Apply topically daily.  . collagenase (SANTYL) ointment Apply topically daily.  . Darbepoetin Alfa (ARANESP) 60 MCG/0.3ML SOSY injection Inject 0.3 mLs (60 mcg total) into the skin every Thursday at 6pm.  . diazepam (VALIUM) 5 MG/ML injection Inject 0.5 mLs (2.5 mg total) into the vein every 4 (four) hours as needed.  . labetalol (NORMODYNE) 100 MG tablet Place 1 tablet (100 mg total) into feeding tube 3 (three) times daily.  Marland Kitchen levalbuterol (XOPENEX) 0.63 MG/3ML nebulizer solution Take 3 mLs (0.63 mg total) by nebulization every 3 (three) hours as needed for wheezing.  . Nutritional Supplements (FEEDING SUPPLEMENT, VITAL AF 1.2 CAL,) LIQD Place 1,000 mLs into feeding tube continuous.  Marland Kitchen oxyCODONE (ROXICODONE) 5 MG/5ML solution Place 5-10 mLs (5-10 mg total) into feeding tube every 2 (two) hours as needed for moderate pain.  . pantoprazole sodium (PROTONIX) 40 mg/20 mL PACK Place 20 mLs (40 mg total) into feeding tube 2 (two) times daily.  Marland Kitchen valproic acid (DEPAKENE) 250 MG/5ML syrup Place 2.5 mLs (125 mg total) into feeding tube 2 (two) times daily.  . Water For Irrigation, Sterile (FREE WATER) SOLN Place 200 mLs into feeding tube every 8 (eight) hours.    FAMILY HISTORY:  Her has no family status information on file.   SOCIAL HISTORY: She  reports that she has never smoked. She has  never used smokeless tobacco. She reports that she does not drink alcohol.  REVIEW OF SYSTEMS:   Unable - as per HPI obtained from records.   SUBJECTIVE:    VITAL SIGNS: BP 90/75 mmHg  Pulse 59  Temp(Src) 96.7 F (35.9 C) (Rectal)  Resp 8  Wt 97.5 kg (214 lb 15.2 oz)  SpO2 92%  HEMODYNAMICS:    VENTILATOR  SETTINGS: Vent Mode:  [-] PRVC FiO2 (%):  [100 %] 100 % Set Rate:  [15 bmp-22 bmp] 22 bmp Vt Set:  [450 mL] 450 mL PEEP:  [10 cmH20] 10 cmH20 Plateau Pressure:  [25 cmH20-26 cmH20] 26 cmH20  INTAKE / OUTPUT:    PHYSICAL EXAMINATION: General:  Critically ill appearing female Neuro:  Unresponsive, eyes open and drifting, does not follow commands, no response to pain HEENT:  Mm dry, trach, R IJ CVL bleeding, bleeding at trach site, bleeding from mouth Cardiovascular:  s1s2 bradycardic, wide complex  Lungs:  resps even, non labored on vent, breathes over vent rate, coarse, R pleur-x attached to bulb  Abdomen:  Round, soft, PEG with purulent drainage  Musculoskeletal:  Anasarca, 3+ pitting edema BLE, enormous unstageable sacral ulcer, bilat heel ulcers wrapped    LABS:  BMET  Recent Labs Lab 11/07/2015 0710 11/18/2015 0718  NA 115* 112*  K 6.4* 6.2*  CL 74* 77*  CO2 22  --   BUN >300* >140*  CREATININE 8.58* 8.40*  GLUCOSE 74 71    Electrolytes  Recent Labs Lab 11/21/2015 0710  CALCIUM 9.6    CBC  Recent Labs Lab 11/20/2015 0710 11/20/2015 0718  WBC 5.8  --   HGB 5.6* 5.8*  HCT 16.4* 17.0*  PLT 97*  --     Coag's No results for input(s): APTT, INR in the last 168 hours.  Sepsis Markers  Recent Labs Lab 11/08/2015 0717  LATICACIDVEN 1.61    ABG  Recent Labs Lab 12/04/2015 0732  PHART 7.127*  PCO2ART 68.3*  PO2ART 106.0*    Liver Enzymes  Recent Labs Lab 11/10/2015 0710  AST 58*  ALT 85*  ALKPHOS 274*  BILITOT 0.5  ALBUMIN 1.2*    Cardiac Enzymes No results for input(s): TROPONINI, PROBNP in the last 168 hours.  Glucose  Recent Labs Lab 11/08/2015 0702  GLUCAP 78    Imaging Dg Chest Portable 1 View  11/25/2015  CLINICAL DATA:  Central line placement. EXAM: PORTABLE CHEST 1 VIEW COMPARISON:  09/28/2015 FINDINGS: Patient slightly rotated to the right. Tracheostomy tube and small caliber right sided pleural catheter unchanged. Metallic  device with associated catheter projected over the mid thoracic spine. Interval placement of right IJ central venous catheter with tip overlying the SVC. Lungs are adequately inflated demonstrate persistent airspace opacification of the left mid upper lung without significant change. Mild prominence of the perihilar vasculature unchanged. Minimal right base opacification slightly worse. No definite effusion. No right-sided pneumothorax. Cardiomediastinal silhouette is prominent but unchanged. Stable widened mediastinum due in part to patient's known ascending aortic aneurysm and dissection. Remainder the exam is unchanged. IMPRESSION: Persistent opacification over the left mid to upper lung which may be due to infection. Slight worsening right base opacification which may be due to atelectasis or infection. Cardiomegaly and suggestion of mild vascular congestion. Stable widened mediastinum due in part to known ascending aortic aneurysm/ dissection. Tubes and lines as described. Electronically Signed   By: Marin Olp M.D.   On: 11/10/2015 07:31     STUDIES:  Renal u/s 5/14>>>  CULTURES: Urine 5/14>>> BCx2 5/14>>>  ANTIBIOTICS: vanc 5/14>>> Zosyn 5/14>>>  SIGNIFICANT EVENTS:   LINES/TUBES: Trach (chronic)>>> R IJ CVL (EDP) 5/14>>> R pleur-x (chronic)>>>  L nephrostomy (Chronic)>>> PEG (chronic)>>>  DISCUSSION:   ASSESSMENT / PLAN:  PULMONARY Chronic vent dependence  Acute on chronic respiratory failure  Presumed HCAP  Respiratory acidosis  Chronic R pleural effusion with chronic pleur-x --presented with bulb attached  P:   Vent support - 8cc/kg  F/u CXR  abx as below  Increase RR 24 and f/u ABG  Remove pleur-x BULB on arrival to ICU - leave catheter and re-dress site    CARDIOVASCULAR Bradycardia - suspect r/t hyperkalemia  Shock - septic shock + hemorrhagic +cardiogenic  P:  Epi gtt  Add dopamine and attempt wean off epi  Trend troponin  Treat hyperkalemia  See  renal  Hold home amiodarone, anti-HTN  RENAL Acute on chronic renal failure  Hydronephrosis with chronic L nephrostomy UTI  Hyponatremia  Hyperkalemia -- treated in ER with kayexalate, Ca, insulin, HCO3 P:   Treat hyperkalemia  F/u chem now  Mg, phos  Renal to see  Renal ultrasound to eval L nephrostomy    GASTROINTESTINAL Chronic PEG  ?GI bleed -- initial reason for presentation to ER was "bleeding from PEG" (none noted on arrival) P:   NPO Site care to PEG  PPI   HEMATOLOGIC Acute on chronic anemia  ?GI  Bleed +/- DIC -- bleeding from mouth, line P:  Transfuse PRBC as soon as available  q6 CBC  SCD"s   INFECTIOUS UTI  HCAP  Sacral decub  P:   Pan culture  Broad spectrum abx as above  Pct, lactate  Wound care   ENDOCRINE No active issue  P:   Monitor glucose on chem   NEUROLOGIC AMS - unknown baseline but from record review appears minimally responsive at baseline  P:   RASS goal: -1    FAMILY  - Updates:  EDP attempting to call family - multiple numbers not in service/wrong number.  Extremely poor prognosis overall.    - Inter-disciplinary family meet or Palliative Care meeting due by:  day [place], NP 11/27/2015  10:49 AM Pager: [phone] or [phone]

## 2015-11-19 NOTE — Progress Notes (Signed)
Patient arrived to unit, reads asystole on monitor despite all efforts, patient edema does not permit accurate cardiac waveform on monitor.  Sat 100% with smooth normal waveform.

## 2015-11-19 NOTE — ED Notes (Signed)
Belfi MD to bedside. Pt has agonal rhythm per ultrasound visualization.  V/O 1 amp of epi

## 2015-11-19 NOTE — Progress Notes (Signed)
Family at bedside, discussed medications and patient status with family.

## 2015-11-19 NOTE — ED Provider Notes (Signed)
.  Central Line Date/Time: 11/07/2015 7:12 AM Performed by: Leo Grosser Authorized by: Leo Grosser Consent: The procedure was performed in an emergent situation. Patient identity confirmed: arm band, provided demographic data and hospital-assigned identification number Indications: vascular access Patient sedated: no Preparation: skin prepped with 2% chlorhexidine Skin prep agent dried: skin prep agent completely dried prior to procedure Sterile barriers: all five maximum sterile barriers used - cap, mask, sterile gown, sterile gloves, and large sterile sheet Hand hygiene: hand hygiene performed prior to central venous catheter insertion Location details: right internal jugular Site selection rationale: significant edema in all other sites Patient position: flat Catheter type: triple lumen Catheter size: 7 Fr Pre-procedure: landmarks identified Ultrasound guidance: yes Sterile ultrasound techniques: sterile gel and sterile probe covers were used Number of attempts: 1 Successful placement: yes Post-procedure: line sutured and dressing applied Assessment: blood return through all ports,  free fluid flow,  placement verified by x-ray and no pneumothorax on x-ray Patient tolerance: Patient tolerated the procedure well with no immediate complications     Leo Grosser, MD 11/13/2015 [phone]

## 2015-11-19 NOTE — Consult Note (Signed)
Renal Consultation Requesting Physician:  EDP Reason for Consult:  Renal failure, acidosis, hyperkalemia, shock  HPI: The patient is a 65 y.o. 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   She was hospitalized here 09/2015 for hypotension and Hb of 4, transfused, treated for sepsis (multiple possible sites, precise one unclear). Creatinine at time of return to [facility] 10/10/15 was around 1.74. She was sent to the ED from [place] on the current occasion for drainage from her PEG tube - ER eval revealed pt to be in shock, bradycardic, with K 6.4, creatinine 8's, severe metabolic acidosis, Na 829. She was treated in the ED for hyperkalemia, started on epinephrine and dopamine drips for hypotension, transferred to MICU. There are no available labs between 4/4 and today.   Palliative care met with the family at the time of the last admission and they were adamant at that time about aggressive medical therapy.  Long term dialysis was discussed at that meeting and was not felt to be appropriate.   CREATININE, SER  Date/Time Value Ref Range Status  11/09/2015 11:35 AM 8.46* 0.44 - 1.00 mg/dL Final  12/01/2015 07:18 AM 8.40* 0.44 - 1.00 mg/dL Final  12/05/2015 07:10 AM 8.58* 0.44 - 1.00 mg/dL Final  10/10/2015 03:28 AM 1.74* 0.44 - 1.00 mg/dL Final  10/09/2015 04:10 AM 1.65* 0.44 - 1.00 mg/dL Final  10/06/2015 05:19 AM 1.77* 0.44 - 1.00 mg/dL Final  10/04/2015 06:00 AM 1.83* 0.44 - 1.00 mg/dL Final    Past Medical History  Diagnosis Date  . Ventilator dependent ([place])   . Atrial fibrillation ([place])   . Seizures ([place])   . Pleural effusion   .  Renal disorder   . Stroke ([place])   . Quadriplegia, functional ([place])   . CHF (congestive heart failure) ([place])   . Hyperthyroidism   . Anemia   . GERD (gastroesophageal reflux disease)     Past Surgical History  Procedure Laterality Date  . Abdominal surgery      Family History: No family history on file. Social History:  reports that she has never smoked. She has never used smokeless tobacco. She reports that she does not drink alcohol. Her drug history is not on file.   Allergies  Allergen Reactions  . Aspirin   . Heparin   . Lorazepam     Prior to Admission medications   Medication Sig Start Date End Date Taking? Authorizing Provider  acetaminophen (TYLENOL) 160 MG/5ML solution Place 20.3 mLs (650 mg total) into feeding tube every 4 (four) hours as needed for mild pain, headache or fever. 10/10/15   Cherene Altes, MD  amiodarone (PACERONE) 200 MG tablet Place 1 tablet (200 mg total) into feeding tube daily. 10/10/15   Cherene Altes, MD  amLODipine (NORVASC) 10 MG tablet Place 1 tablet (10 mg total) into feeding tube daily. 10/10/15   Cherene Altes, MD  chlorhexidine gluconate, SAGE KIT, (PERIDEX) 0.12 % solution 15 mLs by Mouth Rinse route 2 (two) times daily. 10/10/15   Cherene Altes, MD  collagenase (SANTYL) ointment Apply topically daily. 10/10/15   Cherene Altes, MD  collagenase (SANTYL) ointment Apply topically daily. 10/10/15   Cherene Altes, MD  Darbepoetin Alfa (  ARANESP) 60 MCG/0.3ML SOSY injection Inject 0.3 mLs (60 mcg total) into the skin every Thursday at 6pm. 10/10/15   Cherene Altes, MD  diazepam (VALIUM) 5 MG/ML injection Inject 0.5 mLs (2.5 mg total) into the vein every 4 (four) hours as needed. 10/10/15   Cherene Altes, MD  labetalol (NORMODYNE) 100 MG tablet Place 1 tablet (100 mg total) into feeding tube 3 (three) times daily. 10/10/15   Cherene Altes, MD  levalbuterol Penne Lash) 0.63 MG/3ML nebulizer solution Take 3 mLs (0.63 mg total) by  nebulization every 3 (three) hours as needed for wheezing. 10/10/15   Cherene Altes, MD  Nutritional Supplements (FEEDING SUPPLEMENT, VITAL AF 1.2 CAL,) LIQD Place 1,000 mLs into feeding tube continuous. 10/10/15   Cherene Altes, MD  oxyCODONE (ROXICODONE) 5 MG/5ML solution Place 5-10 mLs (5-10 mg total) into feeding tube every 2 (two) hours as needed for moderate pain. 10/10/15   Cherene Altes, MD  pantoprazole sodium (PROTONIX) 40 mg/20 mL PACK Place 20 mLs (40 mg total) into feeding tube 2 (two) times daily. 10/10/15   Cherene Altes, MD  valproic acid (DEPAKENE) 250 MG/5ML syrup Place 2.5 mLs (125 mg total) into feeding tube 2 (two) times daily. 10/10/15   Cherene Altes, MD  Water For Irrigation, Sterile (FREE WATER) SOLN Place 200 mLs into feeding tube every 8 (eight) hours. 10/10/15   Cherene Altes, MD    Inpatient medications: . calcium chloride  1 g Intravenous Once  . [START ON 11/20/2015] ceFEPime (MAXIPIME) IV  1 g Intravenous Q24H  . pantoprazole (PROTONIX) IV  40 mg Intravenous QHS  . sodium bicarbonate      . valproic acid  125 mg Per Tube BID  . [START ON 11/21/2015] vancomycin  1,000 mg Intravenous Q48H   . sodium chloride    . sodium chloride 50 mL/hr at 12/06/2015 1300  . DOPamine 20 mcg/kg/min (11/09/2015 1300)  . epinephrine 5 mcg/min (11/17/2015 1313)  .  sodium bicarbonate 150 mEq in sterile water 1000 mL infusion 50 mL/hr at 12/03/2015 1300   Review of Systems Unobtainable as pt unresponsive, non-communicative  Physical Exam:  BP 83/57 mmHg  Pulse 57  Temp(Src) 94.7 F (34.8 C) (Oral)  Resp 0  Wt 97.5 kg (214 lb 15.2 oz)  SpO2 100%  Gen: Unresponsive. Gross anasarca. Trach/R IJ line, R arm PICC, L nephrostomy, Left IO line, flexiseal, R Pleurex tube Skin:tight d/t edema Blood from mouth Neck: Line in R IJ. Cannot see neck veins Chest: Breathing over vent. Ant chest fairly clear Heart: Wide complex. S1S2 No S3 Distant heart sounds Abdomen: Large pannus.  PEG tube in place Ext: 3+ edema. Left IO line. Both heels are wrapped (and I did not remove the wrapping) Neuro: Eyes wandering, unresponsive, no pain response Back - not examined but a very large unstageable decubitus ulcer is described to me  Labs:  Recent Labs Lab 12/04/2015 0710 11/14/2015 0718 11/15/2015 1135  NA 115* 112* 116*  K 6.4* 6.2* 6.4*  CL 74* 77* 76*  CO2 22  --  20*  GLUCOSE 74 71 78  BUN >300* >140* 293*  CREATININE 8.58* 8.40* 8.46*  CALCIUM 9.6  --  8.2*  PHOS  --   --  9.1*     Recent Labs Lab 11/22/2015 0710  AST 58*  ALT 85*  ALKPHOS 274*  BILITOT 0.5  PROT 5.7*  ALBUMIN 1.2*     Recent Labs Lab 11/16/2015 0710 11/18/2015  1610 12/01/2015 1135  WBC 5.8  --  19.5*  NEUTROABS 5.6  --   --   HGB 5.6* 5.8* 5.6*  HCT 16.4* 17.0* 16.7*  MCV 75.9*  --  76.6*  PLT 97*  --  107*     Recent Labs Lab 11/18/2015 1135  TROPONINI 0.42*   CBG:  Recent Labs Lab 11/11/2015 0702 11/17/2015 1230 11/17/2015 1254  GLUCAP 78 68 71    Xrays/Other Studies: Dg Chest Portable 1 View  11/08/2015  CLINICAL DATA:  Central line placement. EXAM: PORTABLE CHEST 1 VIEW COMPARISON:  09/28/2015 FINDINGS: Patient slightly rotated to the right. Tracheostomy tube and small caliber right sided pleural catheter unchanged. Metallic device with associated catheter projected over the mid thoracic spine. Interval placement of right IJ central venous catheter with tip overlying the SVC. Lungs are adequately inflated demonstrate persistent airspace opacification of the left mid upper lung without significant change. Mild prominence of the perihilar vasculature unchanged. Minimal right base opacification slightly worse. No definite effusion. No right-sided pneumothorax. Cardiomediastinal silhouette is prominent but unchanged. Stable widened mediastinum due in part to patient's known ascending aortic aneurysm and dissection. Remainder the exam is unchanged. IMPRESSION: Persistent opacification  over the left mid to upper lung which may be due to infection. Slight worsening right base opacification which may be due to atelectasis or infection. Cardiomegaly and suggestion of mild vascular congestion. Stable widened mediastinum due in part to known ascending aortic aneurysm/ dissection. Tubes and lines as described. Electronically Signed   By: Marin Olp M.D.   On: 12/02/2015 07:31   Background 65 y.o. 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 She was hospitalized here 09/2015 for hypotension and Hb of 4, transfused, treated for sepsis (multiple possible sites, precise one unclear). Creatinine at time of return to [facility] 10/10/15 was around 1.74. She was sent to the ED from [place] on the current occasion for drainage from her PEG tube.  ER eval revealed pt to be in shock, bradycardic, with K 6.4, creatinine 8's, severe metabolic acidosis, Na 960. She was treated in the ED for hyperkalemia, started on epinephrine and dopamine drips for hypotension, transferred to MICU.    AKI on CKD - with multiple attendant metabolic derangements (hyponatremia, hyperkalemia, severe metabolic acidosis, anasarca). This is in the setting of sepsis/shock in pt with chronic vent dependence/unresponsive state/comorbid conditions catalogued above  - pt is too unstable even for CRRT and in my opinion renal replacement therapy in this situation is inappropriate.  She is unlikely to survive current situation with or without RRT. Dr. Elsworth Soho with CCM and I have discussed case and at this point we both believe best course of action would not include attempting dialysis of  any kind. CCM attempting to contact family.   Palliative care met with the family at the time of the last admission and they were adamant at that time about aggressive medical therapy.  Long term dialysis was discussed at that meeting and was not felt to be appropriate. In this case given present medical issues,  provision of ANY form of  RRT falls into the category of futile care.   Jamal Maes,  MD [facility] [phone] pager 12/01/2015, 2:10 PM

## 2015-11-19 NOTE — Progress Notes (Signed)
Pharmacy Antibiotic Note  Jacqueline Fuentes is a 65 y.o. female admitted on 11/20/2015 from [facility] with drainage from PEG tube and also noted to be bradycardic, hypotensive, and hypothermic.  CXR shows persistent opacification. Pharmacy has been consulted for cefepime and vancomycin dosing. Hx of CKD.  Day #1 of abx for HAP. Hypothermic at 96.7, WBC elevated at 19.5. SCr elevated at 8.4. CrCl ~74ml/min.  Plan: Give cefepime 2g IV x 1, then start cefepime 1g IV Q24 Give vancomycin 2g IV x 1, then start vancomycin 1g IV Q48 (may need to just check VR at ~48 hrs if renal function does not start to improve) Monitor clinical picture, renal function, VT prn F/U C&S, abx deescalation / LOT      Temp (24hrs), Avg:96.7 F (35.9 C), Min:96.7 F (35.9 C), Max:96.7 F (35.9 C)   Recent Labs Lab 11/09/2015 0717 11/16/2015 0718  CREATININE  --  8.40*  LATICACIDVEN 1.61  --     CrCl cannot be calculated (Unknown ideal weight.).    Allergies  Allergen Reactions  . Aspirin   . Heparin   . Lorazepam     Antimicrobials this admission: Cefepime 5/14 >>  Vancomycin 5/14 >>   Dose adjustments this admission: n/a  Microbiology results: 5/14 BCx: sent 5/14 UCx:    Thank you for allowing pharmacy to be a part of this patient's care.  Elenor Quinones, PharmD, BCPS Clinical Pharmacist Pager [phone] 11/08/2015 7:49 AM

## 2015-11-19 NOTE — Progress Notes (Addendum)
eLink Physician-Brief Progress Note Patient Name: Jacqueline Fuentes DOB: [birth year] MRN: [id number]   Date of Service  11/30/2015  HPI/Events of Note  Brief cpr for pea by code team  eICU Interventions  bic bolus for hyperkalemia increae bic gtt rate      Intervention Category Minor Interventions: Other:  [name] 12/01/2015, 11:40 PM

## 2015-11-19 NOTE — Progress Notes (Signed)
[place] Physician Progress Note and Electrolyte Replacement  Patient Name: Jacqueline Fuentes DOB: Dec 30, 1950 MRN: [id number]  Date of Service  11/11/2015   HPI/Events of Note    Recent Labs Lab 11/07/2015 0710 11/16/2015 0718 12/03/2015 1135 11/25/2015 2226  NA 115* 112* 116* 114*  K 6.4* 6.2* 6.4* 6.4*  CL 74* 77* 76* 74*  CO2 22  --  20* 20*  GLUCOSE 74 71 78 116*  BUN >300* >140* 293* 300*  CREATININE 8.58* 8.40* 8.46* 8.53*  CALCIUM 9.6  --  8.2* 8.0*  MG  --   --  3.0*  --   PHOS  --   --  9.1*  --     Estimated Creatinine Clearance: 8 mL/min (by C-G formula based on Cr of 8.53).  Intake/Output      05/14 0701 - 05/15 0700   I.V. (mL/kg) 1867.9 (19.2)   Other 60   IV Piggyback 110   Total Intake(mL/kg) 2037.9 (20.9)   Urine (mL/kg/hr) 20 (0)   Stool 40 (0)   Total Output 60   Net +1977.9        - I/O DETAILED x 24h    Total I/O In: 526.2 [I.V.:466.2; Other:60] Out: -  - I/O THIS SHIFT    ASSESSMENT hyerkalemia  eICURN Interventions  kayexalate   ASSESSMENT: MAJOR ELECTROLYTE      Dr. Brand Males, M.D., F.C.C.P Pulmonary and Critical Care Medicine Staff Physician [facility] Pager: [phone], If no answer or between  15:00h - 7:00h: call [phone]  11/26/2015 11:22 PM

## 2015-11-19 NOTE — Progress Notes (Signed)
Critical care MD attempting to contact family for discussion of plan of care.

## 2015-11-20 ENCOUNTER — Inpatient Hospital Stay (HOSPITAL_COMMUNITY): Payer: Medicare Other

## 2015-11-20 DIAGNOSIS — J9621 Acute and chronic respiratory failure with hypoxia: Secondary | ICD-10-CM | POA: Diagnosis not present

## 2015-11-20 DIAGNOSIS — Z515 Encounter for palliative care: Secondary | ICD-10-CM | POA: Insufficient documentation

## 2015-11-20 DIAGNOSIS — A4159 Other Gram-negative sepsis: Secondary | ICD-10-CM

## 2015-11-20 DIAGNOSIS — R40243 Glasgow coma scale score 3-8, unspecified time: Secondary | ICD-10-CM | POA: Diagnosis not present

## 2015-11-20 DIAGNOSIS — A419 Sepsis, unspecified organism: Secondary | ICD-10-CM | POA: Diagnosis not present

## 2015-11-20 DIAGNOSIS — N186 End stage renal disease: Secondary | ICD-10-CM

## 2015-11-20 DIAGNOSIS — E875 Hyperkalemia: Secondary | ICD-10-CM | POA: Diagnosis not present

## 2015-11-20 DIAGNOSIS — I469 Cardiac arrest, cause unspecified: Secondary | ICD-10-CM | POA: Diagnosis not present

## 2015-11-20 DIAGNOSIS — Z7189 Other specified counseling: Secondary | ICD-10-CM | POA: Insufficient documentation

## 2015-11-20 DIAGNOSIS — A0811 Acute gastroenteropathy due to Norwalk agent: Secondary | ICD-10-CM

## 2015-11-20 LAB — BLOOD CULTURE ID PANEL (REFLEXED)
Acinetobacter baumannii: DETECTED — AB
CANDIDA ALBICANS: NOT DETECTED
CANDIDA KRUSEI: NOT DETECTED
CANDIDA PARAPSILOSIS: NOT DETECTED
Candida glabrata: NOT DETECTED
Candida tropicalis: NOT DETECTED
Carbapenem resistance: NOT DETECTED
ENTEROBACTER CLOACAE COMPLEX: NOT DETECTED
ENTEROBACTERIACEAE SPECIES: NOT DETECTED
Enterococcus species: NOT DETECTED
Escherichia coli: NOT DETECTED
Haemophilus influenzae: NOT DETECTED
KLEBSIELLA OXYTOCA: NOT DETECTED
KLEBSIELLA PNEUMONIAE: NOT DETECTED
Listeria monocytogenes: NOT DETECTED
Methicillin resistance: NOT DETECTED
Neisseria meningitidis: NOT DETECTED
PSEUDOMONAS AERUGINOSA: NOT DETECTED
Proteus species: NOT DETECTED
STREPTOCOCCUS AGALACTIAE: NOT DETECTED
STREPTOCOCCUS PNEUMONIAE: NOT DETECTED
Serratia marcescens: NOT DETECTED
Staphylococcus aureus (BCID): NOT DETECTED
Staphylococcus species: NOT DETECTED
Streptococcus pyogenes: NOT DETECTED
Streptococcus species: NOT DETECTED
Vancomycin resistance: NOT DETECTED

## 2015-11-20 LAB — BASIC METABOLIC PANEL
Anion gap: 19 — ABNORMAL HIGH (ref 5–15)
BUN: 289 mg/dL — AB (ref 6–20)
CALCIUM: 7.9 mg/dL — AB (ref 8.9–10.3)
CO2: 22 mmol/L (ref 22–32)
CREATININE: 8.25 mg/dL — AB (ref 0.44–1.00)
Chloride: 75 mmol/L — ABNORMAL LOW (ref 101–111)
GFR calc Af Amer: 5 mL/min — ABNORMAL LOW (ref >60)
GFR, EST NON AFRICAN AMERICAN: 5 mL/min — AB (ref >60)
Glucose, Bld: 109 mg/dL — ABNORMAL HIGH (ref 65–99)
POTASSIUM: 6.2 mmol/L — AB (ref 3.5–5.1)
SODIUM: 116 mmol/L — AB (ref 135–145)

## 2015-11-20 LAB — GLUCOSE, CAPILLARY
GLUCOSE-CAPILLARY: 113 mg/dL — AB (ref 65–99)
GLUCOSE-CAPILLARY: 116 mg/dL — AB (ref 65–99)
GLUCOSE-CAPILLARY: 129 mg/dL — AB (ref 65–99)
GLUCOSE-CAPILLARY: 146 mg/dL — AB (ref 65–99)
Glucose-Capillary: 114 mg/dL — ABNORMAL HIGH (ref 65–99)
Glucose-Capillary: 131 mg/dL — ABNORMAL HIGH (ref 65–99)

## 2015-11-20 LAB — CBC
HEMATOCRIT: 16.9 % — AB (ref 36.0–46.0)
HEMOGLOBIN: 5.8 g/dL — AB (ref 12.0–15.0)
MCH: 25.8 pg — AB (ref 26.0–34.0)
MCHC: 34.3 g/dL (ref 30.0–36.0)
MCV: 75.1 fL — AB (ref 78.0–100.0)
PLATELETS: 100 10*3/uL — AB (ref 150–400)
RBC: 2.25 MIL/uL — AB (ref 3.87–5.11)
RDW: 17.4 % — ABNORMAL HIGH (ref 11.5–15.5)
WBC: 23.2 10*3/uL — AB (ref 4.0–10.5)

## 2015-11-20 LAB — URINE CULTURE

## 2015-11-20 LAB — MAGNESIUM: MAGNESIUM: 2.8 mg/dL — AB (ref 1.7–2.4)

## 2015-11-20 LAB — PHOSPHORUS: Phosphorus: 8.3 mg/dL — ABNORMAL HIGH (ref 2.5–4.6)

## 2015-11-20 MED ORDER — SODIUM POLYSTYRENE SULFONATE 15 GM/60ML PO SUSP
30.0000 g | Freq: Once | ORAL | Status: AC
  Administered 2015-11-20: 30 g via ORAL
  Filled 2015-11-20: qty 120

## 2015-11-20 MED ORDER — SODIUM CHLORIDE 0.9 % IV SOLN
INTRAVENOUS | Status: DC
  Administered 2015-11-20 – 2015-11-22 (×2): via INTRAVENOUS
  Filled 2015-11-20 (×4): qty 1000

## 2015-11-20 NOTE — Progress Notes (Signed)
CRITICAL VALUE ALERT  Critical value received:  BC ID acinobacter with aerobic gram (-) rods  Date of notification:  11/20/2015  Time of notification:  0636  Critical value read back:Yes.    Nurse who received alert:  Marvia Troost Thompson  MD notified (1st page):  Chase Caller  Time of first page:  0636  MD notified (2nd page):  Time of second page:  Responding MD:  Chase Caller  Time MD responded:  [phone]

## 2015-11-20 NOTE — Consult Note (Addendum)
   WOC wound consult note Reason for Consult: Consult requested for sacrum and right leg and heel. Pt has multiple systemic factors which can impair healing and is critically ill. She has arrested already and her family does not desire comfort care at this time. Sacrum and right leg are very necrotic with extremely limited potential for healing Wound type: Unstageable pressure injury to right inner heel 2X2cm, and .3X.3cm, 100% dry eschar without odor, drainage, or fluctuance.  Edges are loose and beginning to lift. Inner ankle with unstageable pressure injury; 1.2X.2X.1cm, 100% tightly adhered yellow slough, no odor or drainage. Right posterior calf with stage 4 pressure injury; 12X6cm, large amt brown drainage with strong foul odor, 80% necrotic woundbed, 10% exposed tendons, 10% red. Sacrum with stage 4 pressure injury; 10X16X4cm with undermining to wound edges to 5 cm, large amt tan brown drainage with strong foul odor, 100% black eschar with bone palpable, patchy areas of red macerated partial thickness skin loss to surrounding buttocks, appearance consistent with moisture associated skin damage; affected areas 20X30cm, evolving into full thickness skin loss.  Other areas to inner groin are beginning to evolve into partial thickness loss; white; macerated, and blistering; affected areas are 20X20cm bilat.  Posterior back and skin folds beneath breasts and abd are red, macerated, with partial thickness fissures.  Pt has large amt yellow drainage weeping from all areas on her body and generalized edema. There are no family members at the bedside to discuss plan of care.  Pressure Ulcer POA: Yes Dressing procedure/placement/frequency: If aggressive plan of care is desired, then please order surgical consult for sacrum and right leg debridement. Pt could benefit from comfort care decision by family members. These wounds are very severe and have limited potential for healing related to multiple systemic  factors. Moist gauze packing to right leg and sacrum wounds to control odor and drainage. It is best practice to leave dry stable eschar intact to right heel and ankle, protect from further injury and reduce pressure to BLE with protective boots.This consult took one hour to perform.  Please re-consult if further assistance is needed. Thank-you,  Julien Girt MSN, [place], [place], [place], [place]

## 2015-11-20 NOTE — Progress Notes (Signed)
Admit: 11/15/2015 LOS: 1  3M chronic LTACH status with quadriplegia, Trach/Vent dependence, numerous severe comorbidities admit with septic shock, severe AKI, hyperK, hypoNa, Acidosis  Subjective:  Asystolic Code overnight  Acinetobacter in B Cx ANuric Remains on NE + [place]anna with husband, says "1%" chance but also seems clear that she is dying He acknowledged when I told him RRT was not possible in these circumstances and wouldn't correct big piture.    05/14 0701 - 05/15 0700 In: 4317.6 [I.V.:3642.6; IV Piggyback:110] Out: 60 [Urine:20; Stool:40]  Filed Weights   11/18/2015 1000 11/20/15 0500  Weight: 97.5 kg (214 lb 15.2 oz) 126.2 kg (278 lb 3.5 oz)    Scheduled Meds: . antiseptic oral rinse  7 mL Mouth Rinse QID  . ceFEPime (MAXIPIME) IV  1 g Intravenous Q24H  . chlorhexidine gluconate (SAGE KIT)  15 mL Mouth Rinse BID  . pantoprazole (PROTONIX) IV  40 mg Intravenous QHS  . valproic acid  125 mg Per Tube BID  . [START ON 11/21/2015] vancomycin  1,000 mg Intravenous Q48H   Continuous Infusions: . sodium chloride    . sodium chloride 50 mL/hr at 11/20/15 0600  . DOPamine 20 mcg/kg/min (11/20/15 0732)  . epinephrine 16 mcg/min (11/20/15 0736)  .  sodium bicarbonate 150 mEq in sterile water 1000 mL infusion 125 mL/hr at 11/20/15 0600   PRN Meds:.sodium chloride, sodium chloride, albuterol, calcium chloride, dextrose, EPINEPHrine, sodium bicarbonate, sodium chloride flush  Current Labs: reviewed    Physical Exam:  Blood pressure 93/59, pulse 61, temperature 99 F (37.2 C), temperature source Oral, resp. rate 0, weight 126.2 kg (278 lb 3.5 oz), SpO2 100 %. Unresonsive, vent, trach Obese Ashen skin RRR Coarse BS  A 1. AoCKD 2. Hyperkaleia 3. Hyponatremia 4. Acidosis 5. Severe Sepsis on NE and Dopa 6. ANemia 7. Chronic VDRF with Trach 8. PEG dependent 9. Quandriplegia 10. Seizure Disorder 11. Hx/o CVAs 12. dCHF 13. Chronic L Hydro with PCN 14. Large sacral  decubitus   P 1. Cont supportive care, NaHCO3 gtt 2. RRT is futile, family agrees 3. Palliative care   Pearson Grippe MD 11/20/2015, 7:56 AM   Recent Labs Lab 11/14/2015 1135 11/21/2015 2226 11/20/15 0418  NA 116* 114* 116*  K 6.4* 6.4* 6.2*  CL 76* 74* 75*  CO2 20* 20* 22  GLUCOSE 78 116* 109*  BUN 293* 300* 289*  CREATININE 8.46* 8.53* 8.25*  CALCIUM 8.2* 8.0* 7.9*  PHOS 9.1*  --  8.3*    Recent Labs Lab 11/17/2015 0710  11/30/2015 1135 11/08/2015 1525 11/20/15 0418  WBC 5.8  --  19.5* 26.2* 23.2*  NEUTROABS 5.6  --   --   --   --   HGB 5.6*  < > 5.6* 6.2* 5.8*  HCT 16.4*  < > 16.7* 18.3* 16.9*  MCV 75.9*  --  76.6* 75.9* 75.1*  PLT 97*  --  107* 123* 100*  < > = values in this interval not displayed.

## 2015-11-20 NOTE — Progress Notes (Signed)
PULMONARY / CRITICAL CARE MEDICINE   Name: Jacqueline Fuentes MRN: :[id number] DOB: [birth year]    ADMISSION DATE:  12/05/2015  REFERRING MD:  EDP   CHIEF COMPLAINT:  Hypotension, renal failure, shock    SUBJECTIVE:  On multiple pressors  VITAL SIGNS: BP 92/65 mmHg  Pulse 66  Temp(Src) 98.3 F (36.8 C) (Oral)  Resp 24  Wt 278 lb 3.5 oz (126.2 kg)  SpO2 100%  VENTILATOR SETTINGS: Vent Mode:  [-] PRVC FiO2 (%):  [100 %] 100 % Set Rate:  [22 bmp] 22 bmp Vt Set:  [450 mL] 450 mL PEEP:  [10 cmH20] 10 cmH20 Plateau Pressure:  [37 cmH20-49 cmH20] 49 cmH20  INTAKE / OUTPUT: I/O last 3 completed shifts: In: 4588.3 [I.V.:3913.3; Other:565; IV Piggyback:110] Out: 60 [Urine:20; Stool:40]  PHYSICAL EXAMINATION: General: critically ill appearing, foul smell Neuro: comatose HEENT: trach in place, enlarged tongue Cardiac: bradycardic Chest: b/l crackles Abd: distended Ext: 3+ edema Skin: necrotic wounds on sacrum and legs  LABS:  BMET  Recent Labs Lab 11/25/2015 1135 11/08/2015 2226 11/20/15 0418  NA 116* 114* 116*  K 6.4* 6.4* 6.2*  CL 76* 74* 75*  CO2 20* 20* 22  BUN 293* 300* 289*  CREATININE 8.46* 8.53* 8.25*  GLUCOSE 78 116* 109*    Electrolytes  Recent Labs Lab 11/07/2015 1135 12/05/2015 2226 11/20/15 0418  CALCIUM 8.2* 8.0* 7.9*  MG 3.0*  --  2.8*  PHOS 9.1*  --  8.3*    CBC  Recent Labs Lab 11/22/2015 1135 11/07/2015 1525 11/20/15 0418  WBC 19.5* 26.2* 23.2*  HGB 5.6* 6.2* 5.8*  HCT 16.7* 18.3* 16.9*  PLT 107* 123* 100*    Coag's No results for input(s): APTT, INR in the last 168 hours.  Sepsis Markers  Recent Labs Lab 12/05/2015 0717 11/18/2015 1135  LATICACIDVEN 1.61 3.6*  PROCALCITON  --  19.48    ABG  Recent Labs Lab 12/04/2015 0732 11/25/2015 1112  PHART 7.127* 7.127*  PCO2ART 68.3* 62.8*  PO2ART 106.0* 42.0*    Liver Enzymes  Recent Labs Lab 11/06/2015 0710  AST 58*  ALT 85*  ALKPHOS 274*  BILITOT 0.5  ALBUMIN 1.2*     Cardiac Enzymes  Recent Labs Lab 11/28/2015 1135  TROPONINI 0.42*    Glucose  Recent Labs Lab 11/13/2015 1254 11/06/2015 1642 11/20/2015 2007 11/20/15 0100 11/20/15 0418 11/20/15 0720  GLUCAP 71 96 118* 113* 114* 116*    Imaging Dg Chest Port 1 View  11/20/2015  CLINICAL DATA:  Cardiopulmonary arrest with successful resuscitation. EXAM: PORTABLE CHEST 1 VIEW COMPARISON:  11/30/2015 FINDINGS: Tracheostomy. Right central venous catheter overlies the mid SVC region. There appears to be a left chest tube in place. Multiple superimposed leads and wires. Shallow inspiration. Heart size is increased. Mediastinal shadows are increased, without change since prior study, and corresponding to known aortic aneurysm. Blunting of the costophrenic angles likely indicates small effusions. Bilateral airspace infiltrates may indicate edema or infiltration due to pneumonia. Aspiration could also have this appearance. Appearances are similar to previous study. No pneumothorax. IMPRESSION: Appliances are unchanged in position. Cardiac enlargement. Bilateral airspace disease. Small bilateral pleural effusions. Prominence of the right mediastinum and hilar shadows corresponding to known aortic aneurysm. Electronically Signed   By: Lucienne Capers M.D.   On: 11/20/2015 01:02     STUDIES:   CULTURES: 5/14 GI panel >> Norovirus 5/14 Blood >> Acinetobacter baumannii  ANTIBIOTICS: 5/14 Vancomycin >> 5/14 Cefepime >>  SIGNIFICANT EVENTS: 5/14 Transfer from [place], nephrology  consulted 5/15 Cardiac arrest  LINES/TUBES: Trach (chronic)>>> R IJ CVL (EDP) 5/14>>> R pleur-x (chronic)>>>  L nephrostomy (Chronic)>>> PEG (chronic)>>>   ASSESSMENT: Acute on chronic respiratory failure with hypoxia S/p tracheostomy HCAP Chronic Rt pleural effusion s/p pleurx catheter Bradycardia Septic shock Hypovolemic/hemorrhagic shock Cardiac arrest 5/15 Hx of A fib ESRD Hydronephrosis s/p Lt  nephrostomy UTI Hyponatermia Hyperkalemia Dysphagia s/p PEG GI bleeding Acute blood loss anemia >> has rare antibodies limiting ability for transfusion Anemia of chronic disease and critical illness Sacral decubitus ulcer >> present prior to admission Norovirus gastroenteritis Acinetobacter bacteremia Comatose Functional quadriplegia  PLAN: Continue vent support Continue pressors at current level >> would not escalate care Continue antibiotics Agree that CRRT represents futile care  Will have further discuss with pt's husband about goals of care >> he would like to have her under go CPR again if she develops cardiac arrest again  CC time 32 minutes.  Chesley Mires, MD [facility] Pulmonary/Critical Care 11/20/2015, 12:11 PM Pager:  [phone] After 3pm call: [phone]

## 2015-11-20 NOTE — Progress Notes (Signed)
Placed an oral airway to stop patient from biting tongue.  Patient already has significant cut and indentation in tongue from biting it.  Tolerated well.

## 2015-11-20 NOTE — Progress Notes (Signed)
Called Elink regarding CBC 1hr post blood transfusion. No orders placed and 2 units were given. Will continue to monitor. Joaquin Bend E, [place] 11/20/2015 2110

## 2015-11-20 NOTE — Progress Notes (Signed)
eLink Physician-Brief Progress Note Patient Name: Jacqueline Fuentes DOB: 02/22/1951 MRN: [id number]   Date of Service  11/20/2015  HPI/Events of Note  GNR in blood culture  eICU Interventions  Anti-infectives    Start     Dose/Rate Route Frequency Ordered Stop   11/21/15 1000  vancomycin (VANCOCIN) IVPB 1000 mg/200 mL premix     1,000 mg 200 mL/hr over 60 Minutes Intravenous Every 48 hours 11/30/2015 1356     11/20/15 0800  ceFEPIme (MAXIPIME) 1 g in dextrose 5 % 50 mL IVPB     1 g 100 mL/hr over 30 Minutes Intravenous Every 24 hours 11/14/2015 1356     12/04/2015 0830  vancomycin (VANCOCIN) 2,000 mg in sodium chloride 0.9 % 500 mL IVPB     2,000 mg 250 mL/hr over 120 Minutes Intravenous  Once 11/15/2015 0744 11/23/2015 1030   11/12/2015 0800  ceFEPIme (MAXIPIME) 2 g in dextrose 5 % 50 mL IVPB     2 g 100 mL/hr over 30 Minutes Intravenous  Once 12/04/2015 0744 12/01/2015 0838   11/17/2015 0745  vancomycin (VANCOCIN) IVPB 1000 mg/200 mL premix  Status:  Discontinued     1,000 mg 200 mL/hr over 60 Minutes Intravenous  Once 11/09/2015 0738 11/20/2015 0744          Intervention Category Major Interventions: Infection - evaluation and management  Rahiem Schellinger 11/20/2015, 6:35 AM

## 2015-11-20 NOTE — Progress Notes (Signed)
Patient has a 4 mins seizure beginning at 2033 and ending 2037. Elink paged in currently on valproic acid 125 mg BID. Will continue to monitor. Ruben Gottron, [place] 11/20/2015 8:35 PM

## 2015-11-20 NOTE — Consult Note (Signed)
Consultation Note Date: 11/20/2015   Patient Name: Jacqueline Fuentes  DOB: Apr 16, 1951  MRN: [id number]  Age / Sex: 65 y.o., female  PCP: Verneita Griffes, MD Referring Physician: Rigoberto Noel, MD  Reason for Consultation: Establishing goals of care  HPI/Patient Profile: 65 y.o. female  with past medical history of endometrial cancer (??), CKD, bed bound status with trach and PEG, rt pleural effusion s/p pleurx cath, left hydronephrosis s/p nephrostomy, sacral decub.  She was admitted on 11/10/2015 with bleeding from her PEG tube and was found to be in septic shock from multiple sources including UTI, Acinetobacter bacteremia, norovirus, and possibly wound infection.   She has hyperkalemia and acute on chronic renal failure.  Her hgb is 5.6.  Unfortunately she can not be transfused due to the development of antibodies from previous infusions.  After admission she suffered a PE arrest and was coded. She is currently on two pressors and unresponsive.  Her husband insists that she be A full code.  Clinical Assessment and Goals of Care: I spoke with the patient's brother Rae Roam, and her daughters Demetrios Isaacs and 1 other Brooke Dare?).  [name] is one of 10 siblings.  She and Ike have 5 daughters and many grand children.  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 Herbie Baltimore tells me that [name] loved her family and loved to laugh. 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 She and Obie Dredge have been together for 50 years. Her health problems started approximately 12 years ago with the possible diagnosis of endometrial cancer. This was found in [place]. The family reports to me that they had their mother reevaluated at a different hospital who found no evidence of endometrial cancer but treated her with radiation therapy anyway. They feel the radiation therapy led to renal disease. Approximately 8 months  ago the patient needed a nephrostomy tube changed. She went into the hospital to have this procedure done and reportedly received heparin - to which she is very allergic.  For the next 3 days was extraordinarily lethargic and not responsive to the family. The family insisted something was wrong. The patient subsequently had to be trached and pegged and has required support ever since. The last time their mother spoke to them in a regular voice was 8 months ago. Per Fanny Bien, [name] told her husband Obie Dredge to have everything done for her. She wanted to be kept alive at all costs. Her husband took that statement heart and is doing everything he can keep her alive. He feels that if there is a mustard seed of Hope a miracle could happen and God could heal his wife.  Unfortunately Mr. Albin Fischer had stepped out to clear his head when I'm at with the family. I will try again to meet with him tomorrow.  NEXT OF KIN: Mr. Ferris Tally, husband    SUMMARY OF RECOMMENDATIONS   Patient is currently full code   Symptom Management:   Per attending physician  Psycho-social/Spiritual:   Desire for further Chaplaincy support:yes  Additional Recommendations: Caregiving  Support/Resources  Prognosis:   Hours - Days  Discharge Planning: Anticipated Hospital Death      Primary Diagnoses: Present on Admission:  . Bradycardia  I have reviewed the medical record, interviewed the patient and family, and examined the patient. The following aspects are pertinent.  Past Medical History  Diagnosis Date  . Ventilator dependent ([place])   . Atrial fibrillation ([place])   . Seizures ([place])   . Pleural effusion   . Renal disorder   . Stroke ([place])   . Quadriplegia, functional ([place])   . CHF (congestive heart failure) ([place])   . Hyperthyroidism   . Anemia   . GERD (gastroesophageal reflux disease)    Social History   Social History  . Marital Status: Married    Spouse Name: N/A  . Number of Children:  N/A  . Years of Education: N/A   Social History Main Topics  . Smoking status: Never Smoker   . Smokeless tobacco: Never Used     Comment: ujnknown smoking history  . Alcohol Use: No  . Drug Use: None  . Sexual Activity: Not Asked   Other Topics Concern  . None   Social History Narrative   No family history on file. Scheduled Meds: . antiseptic oral rinse  7 mL Mouth Rinse QID  . ceFEPime (MAXIPIME) IV  1 g Intravenous Q24H  . chlorhexidine gluconate (SAGE KIT)  15 mL Mouth Rinse BID  . pantoprazole (PROTONIX) IV  40 mg Intravenous QHS  . valproic acid  125 mg Per Tube BID  . [START ON 11/21/2015] vancomycin  1,000 mg Intravenous Q48H   Continuous Infusions: . sodium chloride    . sodium chloride 50 mL/hr at 11/20/15 0600  . DOPamine 20 mcg/kg/min (11/20/15 1340)  . epinephrine 18 mcg/min (11/20/15 1510)  . sodium chloride 0.9 % 1,000 mL with sodium acetate 150 mEq infusion 10 mL/hr at 11/20/15 1213   PRN Meds:.sodium chloride, sodium chloride, albuterol, calcium chloride, dextrose, EPINEPHrine, sodium chloride flush Medications Prior to Admission:  Prior to Admission medications   Medication Sig Start Date End Date Taking? Authorizing Provider  acetaminophen (TYLENOL) 160 MG/5ML solution Place 20.3 mLs (650 mg total) into feeding tube every 4 (four) hours as needed for mild pain, headache or fever. 10/10/15   Cherene Altes, MD  amiodarone (PACERONE) 200 MG tablet Place 1 tablet (200 mg total) into feeding tube daily. 10/10/15   Cherene Altes, MD  amLODipine (NORVASC) 10 MG tablet Place 1 tablet (10 mg total) into feeding tube daily. 10/10/15   Cherene Altes, MD  chlorhexidine gluconate, SAGE KIT, (PERIDEX) 0.12 % solution 15 mLs by Mouth Rinse route 2 (two) times daily. 10/10/15   Cherene Altes, MD  collagenase (SANTYL) ointment Apply topically daily. 10/10/15   Cherene Altes, MD  collagenase (SANTYL) ointment Apply topically daily. 10/10/15   Cherene Altes, MD    Darbepoetin Alfa (ARANESP) 60 MCG/0.3ML SOSY injection Inject 0.3 mLs (60 mcg total) into the skin every Thursday at 6pm. 10/10/15   Cherene Altes, MD  diazepam (VALIUM) 5 MG/ML injection Inject 0.5 mLs (2.5 mg total) into the vein every 4 (four) hours as needed. 10/10/15   Cherene Altes, MD  labetalol (NORMODYNE) 100 MG tablet Place 1 tablet (100 mg total) into feeding tube 3 (three) times daily. 10/10/15   Cherene Altes, MD  levalbuterol Penne Lash) 0.63 MG/3ML nebulizer solution  Take 3 mLs (0.63 mg total) by nebulization every 3 (three) hours as needed for wheezing. 10/10/15   Cherene Altes, MD  Nutritional Supplements (FEEDING SUPPLEMENT, VITAL AF 1.2 CAL,) LIQD Place 1,000 mLs into feeding tube continuous. 10/10/15   Cherene Altes, MD  oxyCODONE (ROXICODONE) 5 MG/5ML solution Place 5-10 mLs (5-10 mg total) into feeding tube every 2 (two) hours as needed for moderate pain. 10/10/15   Cherene Altes, MD  pantoprazole sodium (PROTONIX) 40 mg/20 mL PACK Place 20 mLs (40 mg total) into feeding tube 2 (two) times daily. 10/10/15   Cherene Altes, MD  valproic acid (DEPAKENE) 250 MG/5ML syrup Place 2.5 mLs (125 mg total) into feeding tube 2 (two) times daily. 10/10/15   Cherene Altes, MD  Water For Irrigation, Sterile (FREE WATER) SOLN Place 200 mLs into feeding tube every 8 (eight) hours. 10/10/15   Cherene Altes, MD   Allergies  Allergen Reactions  . Aspirin   . Heparin   . Lorazepam    Review of Systems patient is not responsive  Physical Exam  Well-developed nonresponsive female with a bloody tongue currently on trach support, pressors, antibiotics CV: Difficult to hear due to ventilation but no frank murmurs rubs or gallops Respiratory: Trached on ventilator support. No apparent distress Abdomen: Obese, slightly distended Extremities: 3+ edema  Vital Signs: BP 100/60 mmHg  Pulse 66  Temp(Src) 98.6 F (37 C) (Oral)  Resp 0  Wt 126.2 kg (278 lb 3.5 oz)  SpO2 100% Pain  Assessment: CPOT     SpO2: SpO2: 100 % O2 Device:SpO2: 100 % O2 Flow Rate: .   IO: Intake/output summary:   Intake/Output Summary (Last 24 hours) at 11/20/15 1631 Last data filed at 11/20/15 1600  Gross per 24 hour  Intake 5346.49 ml  Output     10 ml  Net 5336.49 ml    LBM: Last BM Date: 11/20/15 (rectal tube in place) Baseline Weight: Weight: 97.5 kg (214 lb 15.2 oz) (from 4/4) Most recent weight: Weight: 126.2 kg (278 lb 3.5 oz)     Palliative Assessment/Data:   Flowsheet Rows        Most Recent Value   Intake Tab    Referral Department  Critical care   Unit at Time of Referral  ICU   Palliative Care Primary Diagnosis  Sepsis/Infectious Disease   Date Notified  11/13/2015   Palliative Care Type  Return patient Palliative Care   Reason for referral  Clarify Goals of Care   Date of Admission  12/02/2015   # of days IP prior to Palliative referral  0   Clinical Assessment    Palliative Performance Scale Score  10%   Psychosocial & Spiritual Assessment    Palliative Care Outcomes       Time In: 3:00 PM  Time Out: 4:10 PM Time Total: 70 MIN Greater than 50%  of this time was spent counseling and coordinating care related to the above assessment and plan.  Signed by: Melton Alar, PA-C   Please contact Palliative Medicine Team phone at [phone] for questions and concerns.  For individual provider: See Shea Evans

## 2015-11-20 NOTE — Progress Notes (Signed)
Patient went into asystole with no palpable pulse. CPR began at 2333 and ended 2338. ROSC was achieved at 2338 with CPR another 48mins to circulate drugs given. 1 amp of bicarb was given at 2335 with another amp at 2337 and 1 of calcium chloride given at 2336. Patient is still in ventricular rhythm with EKG accelerated junctional tachycardia. Pads and backboard will remain on patient and will continue to monitor. CXR ordered and kayexcelate 30mg  given for K of 6.2.Joaquin Bend E, [place] 12/03/2015 2333

## 2015-11-20 NOTE — Code Documentation (Signed)
  Patient Name: Jacqueline Fuentes   MRN: [id number]   Date of Birth/ Sex: [birth year] , female      Admission Date: 11/27/2015  Attending Provider: Rigoberto Noel, MD  Primary Diagnosis: <principal problem not specified>   Indication: Pt was intubated and sedated in the ICU until this PM, when she was noted to be in PEA arrest. Code blue was subsequently called. At the time of arrival on scene, ACLS protocol was underway.  Patient is chronically severely ill from [facility] presenting with severe shock, anemia, renal failure with uremia and hyperkalemia. She has been evaluated for hemodialysis for her renal failure and not felt to be a good candidate.   Technical Description:  - CPR performance duration:  5 minutes  - Was defibrillation or cardioversion used? No   - Was external pacer placed? No  - Was patient intubated pre/post CPR? Yes   Medications Administered: Y = Yes; Blank = No Amiodarone    Atropine    Calcium  Y  Epinephrine  Y  Lidocaine    Magnesium    Norepinephrine    Phenylephrine    Sodium bicarbonate  Y  Vasopressin     Post CPR evaluation:  - Final Status - Was patient successfully resuscitated ? Yes - What is current rhythm? Wide complex/junctional rhythm - What is current hemodynamic status? Guarded, perfusing on dopamine and epinephrine  Miscellaneous Information:  - Labs sent, including:   - Primary team notified?  Yes, via phone and [place]  - Family Notified? Yes, present at bedside  - Additional notes/ transfer status: Patient continuous infusions adjusted bicarbonate drip to 160mL/hr and epinephrine to 10 mcg/min.     Collier Salina, MD  11/20/2015, 12:19 AM

## 2015-11-20 NOTE — Progress Notes (Signed)
eLink Physician-Brief Progress Note Patient Name: Jacqueline Fuentes DOB: 1951-05-31 MRN: [id number]   Date of Service  11/20/2015  HPI/Events of Note  Brief self limiting seizure in a patient with history of seizure and that is terminal per sign out from bedside MD.  eICU Interventions  Will not initiate any more interventions.     Intervention Category Major Interventions: Seizures - evaluation and management  YACOUB,WESAM 11/20/2015, 8:37 PM

## 2015-11-20 NOTE — Progress Notes (Signed)
eLink Physician-Brief Progress Note Patient Name: Jacqueline Fuentes DOB: April 15, 1951 MRN: [id number]   Date of Service  11/20/2015  HPI/Events of Note  Post CPR - husband spoke to eMD me  abotu CPR   1. He said to do CPR 2 more times (total #3). And each time he has to be infomed. Then if  4th cardiac arrest - no cpr  2.  Wanted to know etiology for renal failure - deferred it to AM team  eICU Interventions  x     Intervention Category Major Interventions: Code management / supervision  [name] 11/20/2015, 1:40 AM

## 2015-11-21 DIAGNOSIS — R7881 Bacteremia: Secondary | ICD-10-CM | POA: Diagnosis not present

## 2015-11-21 DIAGNOSIS — Z7189 Other specified counseling: Secondary | ICD-10-CM | POA: Diagnosis not present

## 2015-11-21 DIAGNOSIS — E875 Hyperkalemia: Secondary | ICD-10-CM | POA: Diagnosis not present

## 2015-11-21 DIAGNOSIS — A419 Sepsis, unspecified organism: Secondary | ICD-10-CM | POA: Diagnosis not present

## 2015-11-21 DIAGNOSIS — Z515 Encounter for palliative care: Secondary | ICD-10-CM | POA: Diagnosis not present

## 2015-11-21 LAB — GLUCOSE, CAPILLARY
GLUCOSE-CAPILLARY: 126 mg/dL — AB (ref 65–99)
GLUCOSE-CAPILLARY: 136 mg/dL — AB (ref 65–99)
GLUCOSE-CAPILLARY: 134 mg/dL — AB (ref 65–99)
Glucose-Capillary: 160 mg/dL — ABNORMAL HIGH (ref 65–99)
Glucose-Capillary: 147 mg/dL — ABNORMAL HIGH (ref 65–99)
Glucose-Capillary: 124 mg/dL — ABNORMAL HIGH (ref 65–99)

## 2015-11-21 MED ORDER — PANTOPRAZOLE SODIUM 40 MG IV SOLR
40.0000 mg | INTRAVENOUS | Status: DC
  Administered 2015-11-21 – 2015-11-24 (×4): 40 mg via INTRAVENOUS
  Filled 2015-11-21 (×6): qty 40

## 2015-11-21 MED ORDER — MIDAZOLAM HCL 2 MG/2ML IJ SOLN
1.0000 mg | INTRAMUSCULAR | Status: DC | PRN
  Administered 2015-11-21 (×2): 2 mg via INTRAVENOUS
  Filled 2015-11-21: qty 2
  Filled 2015-11-21: qty 4
  Filled 2015-11-21: qty 2

## 2015-11-21 MED ORDER — SODIUM CHLORIDE 0.9 % IV SOLN
80.0000 mg | Freq: Every day | INTRAVENOUS | Status: DC
  Filled 2015-11-21: qty 80

## 2015-11-21 MED ORDER — SODIUM CHLORIDE 0.9 % IV SOLN
500.0000 mg | Freq: Two times a day (BID) | INTRAVENOUS | Status: DC
  Administered 2015-11-21 – 2015-11-24 (×7): 500 mg via INTRAVENOUS
  Filled 2015-11-21 (×8): qty 5

## 2015-11-21 MED ORDER — SODIUM CHLORIDE 0.9 % IV SOLN
500.0000 mg | Freq: Two times a day (BID) | INTRAVENOUS | Status: DC
  Administered 2015-11-21 – 2015-11-22 (×3): 500 mg via INTRAVENOUS
  Filled 2015-11-21 (×4): qty 0.5

## 2015-11-21 NOTE — Progress Notes (Signed)
Daily Progress Note   Patient Name: Jacqueline Fuentes       Date: 11/21/2015 DOB: 1950-12-13  Age: 65 y.o. MRN#: [id number] Attending Physician: Rigoberto Noel, MD Primary Care Physician: Verneita Griffes, MD Admit Date: 11/26/2015  Reason for Consultation/Follow-up: Establishing goals of care  Subjective: Patient unable to speak  Length of Stay: 2  Current Medications: Scheduled Meds:  . antiseptic oral rinse  7 mL Mouth Rinse QID  . ceFEPime (MAXIPIME) IV  1 g Intravenous Q24H  . chlorhexidine gluconate (SAGE KIT)  15 mL Mouth Rinse BID  . levETIRAcetam  500 mg Intravenous Q12H  . pantoprazole (PROTONIX) IV  40 mg Intravenous Q24H  . valproic acid  125 mg Per Tube BID    Continuous Infusions: . sodium chloride    . sodium chloride 50 mL/hr at 11/21/15 0400  . DOPamine 20 mcg/kg/min (11/21/15 0400)  . epinephrine 12 mcg/min (11/21/15 0423)  . sodium chloride 0.9 % 1,000 mL with sodium acetate 150 mEq infusion 10 mL/hr at 11/21/15 0400    PRN Meds: sodium chloride, sodium chloride, albuterol, calcium chloride, dextrose, EPINEPHrine, midazolam, sodium chloride flush  Physical Exam    Obese female, trach on ventilator support, peg in place, does not speak, opens eyes slightly.  Odor of decay in the room. CV RRR Resp NAD Abd;  PEG in place.  Distended and firm Extremities:  3+ edema.       Non purposeful movement of rue.   Vital Signs: BP 82/48 mmHg  Pulse 56  Temp(Src) 98.2 F (36.8 C) (Oral)  Resp 23  Wt 126.3 kg (278 lb 7.1 oz)  SpO2 97% SpO2: SpO2: 97 % O2 Device: O2 Device: Ventilator O2 Flow Rate:    Intake/output summary:  Intake/Output Summary (Last 24 hours) at 11/21/15 0837 Last data filed at 11/21/15 0400  Gross per 24 hour  Intake 3970.67 ml  Output     10  ml  Net 3960.67 ml   LBM: Last BM Date: 11/20/15 (flexiseal) Baseline Weight: Weight: 97.5 kg (214 lb 15.2 oz) (from 4/4) Most recent weight: Weight: 126.3 kg (278 lb 7.1 oz)       Palliative Assessment/Data:    Flowsheet Rows        Most Recent Value   Intake Tab    Referral Department  Critical care   Unit at Time of Referral  ICU   Palliative Care Primary Diagnosis  Sepsis/Infectious Disease   Date Notified  11/26/2015   Palliative Care Type  Return patient Palliative Care   Reason for referral  Clarify Goals of Care   Date of Admission  11/26/2015   # of days IP prior to Palliative referral  0   Clinical Assessment    Palliative Performance Scale Score  10%   Psychosocial & Spiritual Assessment    Palliative Care Outcomes       Patient Active Problem List   Diagnosis Date Noted  . Palliative care encounter   . Goals of care, counseling/discussion   . End of life care   . Bradycardia 11/13/2015  . Osteomyelitis of lumbar spine ([place])   . Seizure disorder ([place])   . Anxiety state   . Depression   . Sacral decubitus ulcer   . Hypernatremia   . Chronic respiratory failure with hypoxia ([place])   . Ventilator dependent ([place])   . Essential hypertension   . Chronic atrial fibrillation ([place])   . Acute on chronic renal failure ([place])   . Protein-calorie malnutrition, severe ([place])   . Absolute anemia   . Acute blood loss anemia 09/21/2015  . Pressure ulcer 09/21/2015    Palliative Care Assessment & Plan   Patient Profile:  65 y.o. female with past medical history of endometrial cancer (??), CKD, bed bound status with trach and PEG, rt pleural effusion s/p pleurx cath, left hydronephrosis s/p nephrostomy, sacral decub. She was admitted on 11/26/2015 with bleeding from her PEG tube and was found to be in septic shock from multiple sources including UTI, Acinetobacter bacteremia, norovirus, and possibly wound infection. She has hyperkalemia and acute on chronic renal failure.  Her hgb is 5.6. After admission she suffered a PE arrest and was coded. She is currently on two pressors and unresponsive.  She is making no urine.  She had 3 seizures overnight on 5/15 - 5/16. Her husband insists that she be A full code.   Assessment: I spoke with Jacqueline Fuentes and relayed that her mother had 3 seizures last night.  She stated that she and her family would be in around 11:00 this morning.  I asked to speak to her father at 12:00 and Jacqueline Fuentes agreed.    The majority of the family supports comfort care.  Her husband Jacqueline Fuentes has been unable/unwilling to change her code status from full.  To date he has been unable to let her go.  Recommendations/Plan:  Will plan for family meeting at 12:00 today with the goal of comfort care and a peaceful passing.     Code Status: Full code.  Will hope to change to comfort care today.   Prognosis:   Hours - Days  Discharge Planning:  Anticipated Hospital Death  Care plan was discussed with Dtr Jacqueline Fuentes.  Thank you for allowing the Palliative Medicine Team to assist in the care of this patient.   Time In: 7:30 Time Out: 8:05 Total Time 35 Prolonged Time Billed no      Greater than 50%  of this time was spent counseling and coordinating care related to the above assessment and plan.  Melton Alar, PA-C  Please contact Palliative Medicine Team phone at [phone] for questions and concerns.

## 2015-11-21 NOTE — Progress Notes (Signed)
Nutrition Brief Note  Chart reviewed due to ventilator status and hx of PEG. Hopeful transition to comfort care today after meeting with Palliative Care at 12 noon. Hospital death expected. No nutrition interventions warranted at this time.  Please consult as needed.   Molli Barrows, RD, LDN, [place] Pager [phone] After Hours Pager [phone]

## 2015-11-21 NOTE — Progress Notes (Signed)
Pharmacy Antibiotic Note  Jacqueline Fuentes is a 65 y.o. female admitted on 11/11/2015 from [facility] with drainage from PEG tube and also noted to be bradycardic, hypotensive, and hypothermic.  CXR shows persistent opacification. Blood cultures are now growing acinetobacter.    Plan: -Stop vancomycin and cefepime -Begin meropenem 500 mg IV q12h -Monitor closely for seizure activity  -F/u cultures and susceptibilities, deescalate if able -F/u GOC and duration of therapy   Weight: 278 lb 7.1 oz (126.3 kg)  Temp (24hrs), Avg:98.3 F (36.8 C), Min:97.9 F (36.6 C), Max:99 F (37.2 C)   Recent Labs Lab 11/30/2015 0710 12/04/2015 0717 11/18/2015 0718 11/11/2015 1135 11/10/2015 1525 12/05/2015 2226 11/20/15 0418  WBC 5.8  --   --  19.5* 26.2*  --  23.2*  CREATININE 8.58*  --  8.40* 8.46*  --  8.53* 8.25*  LATICACIDVEN  --  1.61  --  3.6*  --   --   --     Estimated Creatinine Clearance: 9.5 mL/min (by C-G formula based on Cr of 8.25).    Allergies  Allergen Reactions  . Aspirin   . Heparin   . Lorazepam     Antimicrobials this admission: Cefepime 5/14 >> 5/16 Vancomycin 5/14 >> 5/16 Meropenem 5/16 >>  Dose adjustments this admission: n/a  Microbiology results: Blood cx 5/14 > acinetobacter 2/2  Urine cx 5/14 > multiple species  GI PCR > norovirus    Thank you for allowing pharmacy to be a part of this patient's care.   Hughes Better, PharmD, BCPS Clinical Pharmacist 11/21/2015 8:55 AM

## 2015-11-21 NOTE — Progress Notes (Signed)
Admit: 11/10/2015 LOS: 2  60M chronic LTACH status with quadriplegia, Trach/Vent dependence, numerous severe comorbidities admit with septic shock, severe AKI, hyperK, hypoNa, Acidosis  Subjective:  Palliative care meeting with family Seizures overnight No UOP Remains on pressors  05/15 0701 - 05/16 0700 In: 4292.3 [I.V.:3837.3; Blood:335; IV Piggyback:50] Out: 10 [Urine:10]  Filed Weights   11/17/2015 1000 11/20/15 0500 11/21/15 0242  Weight: 97.5 kg (214 lb 15.2 oz) 126.2 kg (278 lb 3.5 oz) 126.3 kg (278 lb 7.1 oz)    Scheduled Meds: . antiseptic oral rinse  7 mL Mouth Rinse QID  . ceFEPime (MAXIPIME) IV  1 g Intravenous Q24H  . chlorhexidine gluconate (SAGE KIT)  15 mL Mouth Rinse BID  . pantoprazole (PROTONIX) IV  80 mg Intravenous QHS  . valproic acid  125 mg Per Tube BID  . vancomycin  1,000 mg Intravenous Q48H   Continuous Infusions: . sodium chloride    . sodium chloride 50 mL/hr at 11/21/15 0400  . DOPamine 20 mcg/kg/min (11/21/15 0400)  . epinephrine 12 mcg/min (11/21/15 0423)  . sodium chloride 0.9 % 1,000 mL with sodium acetate 150 mEq infusion 10 mL/hr at 11/21/15 0400   PRN Meds:.sodium chloride, sodium chloride, albuterol, calcium chloride, dextrose, EPINEPHrine, midazolam, sodium chloride flush  Current Labs: reviewed    Physical Exam:  Blood pressure 82/48, pulse 56, temperature 98.2 F (36.8 C), temperature source Oral, resp. rate 23, weight 126.3 kg (278 lb 7.1 oz), SpO2 97 %. Unresonsive, vent, trach Obese Ashen skin RRR Coarse BS  A 1. AoCKD 2. Hyperkalemia 3. Hyponatremia 4. Acidosis 5. Severe Sepsis on NE and Dopa 6. ANemia 7. Chronic VDRF with Trach 8. PEG dependent 9. Quandriplegia 10. Seizure Disorder 11. Hx/o CVAs 12. dCHF 13. Chronic L Hydro with PCN 14. Large sacral decubitus   P 1. CCM and Palliative care workign with family; pt is dying 2. RRT is futile, family agrees 3. Nothing more to offer, will s/o now, please call  with questions or concerns   Pearson Grippe MD 11/21/2015, 8:16 AM   Recent Labs Lab 12/02/2015 1135 11/08/2015 2226 11/20/15 0418  NA 116* 114* 116*  K 6.4* 6.4* 6.2*  CL 76* 74* 75*  CO2 20* 20* 22  GLUCOSE 78 116* 109*  BUN 293* 300* 289*  CREATININE 8.46* 8.53* 8.25*  CALCIUM 8.2* 8.0* 7.9*  PHOS 9.1*  --  8.3*    Recent Labs Lab 11/16/2015 0710  11/27/2015 1135 11/26/2015 1525 11/20/15 0418  WBC 5.8  --  19.5* 26.2* 23.2*  NEUTROABS 5.6  --   --   --   --   HGB 5.6*  < > 5.6* 6.2* 5.8*  HCT 16.4*  < > 16.7* 18.3* 16.9*  MCV 75.9*  --  76.6* 75.9* 75.1*  PLT 97*  --  107* 123* 100*  < > = values in this interval not displayed.

## 2015-11-21 NOTE — Progress Notes (Signed)
PULMONARY / CRITICAL CARE MEDICINE   Name: Jacqueline Fuentes MRN: [place]:[id number] DOB: Aug 06, 1950    ADMISSION DATE:  11/17/2015  REFERRING MD:  EDP   CHIEF COMPLAINT:  Hypotension, renal failure, shock    SUBJECTIVE:  Several seizures overnight.  VITAL SIGNS: BP 82/48 mmHg  Pulse 56  Temp(Src) 98.2 F (36.8 C) (Oral)  Resp 23  Wt 278 lb 7.1 oz (126.3 kg)  SpO2 97%  VENTILATOR SETTINGS: Vent Mode:  [-] PRVC FiO2 (%):  [50 %-100 %] 50 % Set Rate:  [22 bmp] 22 bmp Vt Set:  [450 mL] 450 mL PEEP:  [10 cmH20] 10 cmH20 Plateau Pressure:  [31 cmH20-37 cmH20] 31 cmH20  INTAKE / OUTPUT: I/O last 3 completed shifts: In: 7368.9 [I.V.:6348.9; Blood:335; Other:635; IV Piggyback:50] Out: 10 [Urine:10]  PHYSICAL EXAMINATION: General: critically ill appearing, foul smell Neuro: comatose HEENT: trach in place, enlarged tongue Cardiac: bradycardic Chest: b/l crackles Abd: distended Ext: 3+ edema Skin: necrotic wounds on sacrum and legs  LABS:  BMET  Recent Labs Lab 11/17/2015 1135 11/18/2015 2226 11/20/15 0418  NA 116* 114* 116*  K 6.4* 6.4* 6.2*  CL 76* 74* 75*  CO2 20* 20* 22  BUN 293* 300* 289*  CREATININE 8.46* 8.53* 8.25*  GLUCOSE 78 116* 109*    Electrolytes  Recent Labs Lab 11/18/2015 1135 11/29/2015 2226 11/20/15 0418  CALCIUM 8.2* 8.0* 7.9*  MG 3.0*  --  2.8*  PHOS 9.1*  --  8.3*    CBC  Recent Labs Lab 11/27/2015 1135 11/23/2015 1525 11/20/15 0418  WBC 19.5* 26.2* 23.2*  HGB 5.6* 6.2* 5.8*  HCT 16.7* 18.3* 16.9*  PLT 107* 123* 100*    Coag's No results for input(s): APTT, INR in the last 168 hours.  Sepsis Markers  Recent Labs Lab 11/25/2015 0717 11/15/2015 1135  LATICACIDVEN 1.61 3.6*  PROCALCITON  --  19.48    ABG  Recent Labs Lab 11/18/2015 0732 11/18/2015 1112  PHART 7.127* 7.127*  PCO2ART 68.3* 62.8*  PO2ART 106.0* 42.0*    Liver Enzymes  Recent Labs Lab 12/02/2015 0710  AST 58*  ALT 85*  ALKPHOS 274*  BILITOT 0.5  ALBUMIN 1.2*     Cardiac Enzymes  Recent Labs Lab 11/23/2015 1135  TROPONINI 0.42*    Glucose  Recent Labs Lab 11/20/15 0720 11/20/15 1137 11/20/15 1559 11/20/15 1954 11/20/15 2323 11/21/15 0336  GLUCAP 116* 131* 129* 146* 160* 147*    Imaging No results found.   STUDIES:   CULTURES: 5/14 GI panel >> Norovirus 5/14 Blood >> Acinetobacter baumannii  ANTIBIOTICS: 5/14 Vancomycin >> 5/16 5/14 Cefepime >> 5/16 5/16 Meropenem >>   SIGNIFICANT EVENTS: 5/14 Transfer from [place], nephrology consulted 5/15 Cardiac arrest 5/16 Seizures  LINES/TUBES: Trach (chronic)>>> R IJ CVL (EDP) 5/14>>> R pleur-x (chronic)>>>  L nephrostomy (Chronic)>>> PEG (chronic)>>>   ASSESSMENT: Acute on chronic respiratory failure with hypoxia S/p tracheostomy HCAP Chronic Rt pleural effusion s/p pleurx catheter Bradycardia Septic shock Hypovolemic/hemorrhagic shock Cardiac arrest 5/15 Hx of A fib ESRD Hydronephrosis s/p Lt nephrostomy UTI Hyponatermia Hyperkalemia Dysphagia s/p PEG GI bleeding Acute blood loss anemia >> has rare antibodies limiting ability for transfusion Anemia of chronic disease and critical illness Sacral decubitus ulcer >> present prior to admission Norovirus gastroenteritis Acinetobacter bacteremia Comatose Functional quadriplegia Seizures  PLAN: Continue vent support Continue pressors at current level >> would not escalate care Continue antibiotics Agree that CRRT represents futile care Continue discussion with family about goals of care Add keppra to help  control seizures Defer additional lab testing or imaging studies >> no benefit at this point  Chesley Mires, MD [place] 11/21/2015, 8:25 AM Pager:  [phone] After 3pm call: [phone]

## 2015-11-21 NOTE — Progress Notes (Signed)
Pt had another seizure this morning at 0809 lasting 1 minute. 2mg  of versed were given. MD aware.

## 2015-11-21 NOTE — Progress Notes (Signed)
Patient had another seizure lasting for three minutes. Vomited moderate amount of blood. CCM called and versed pushes added and protonix IVPB ordered. Will continue to monitor. Jacqueline Fuentes E, [place] 11/21/2015 0120

## 2015-11-21 NOTE — Progress Notes (Signed)
Patient had her third seizure lasting 2 mins. 2mg  versed were given. Patient is resting now. Joaquin Bend E, [place] 11/21/2015 2:24 AM

## 2015-11-21 NOTE — Progress Notes (Signed)
[facility] Progress Note Patient Name: Jacqueline Fuentes DOB: October 18, 1950 MRN: [place]:[id number]   Date of Service  11/21/2015  HPI/Events of Note  Seized again and some bloody vomit  eICU Interventions  Versed prn incrase ppi dose     Intervention Category Major Interventions: Seizures - evaluation and management  Evelin Cake 11/21/2015, 2:03 AM

## 2015-11-22 DIAGNOSIS — Z7189 Other specified counseling: Secondary | ICD-10-CM | POA: Diagnosis not present

## 2015-11-22 DIAGNOSIS — Z515 Encounter for palliative care: Secondary | ICD-10-CM | POA: Diagnosis not present

## 2015-11-22 DIAGNOSIS — E875 Hyperkalemia: Secondary | ICD-10-CM | POA: Diagnosis not present

## 2015-11-22 DIAGNOSIS — J9621 Acute and chronic respiratory failure with hypoxia: Secondary | ICD-10-CM | POA: Diagnosis not present

## 2015-11-22 DIAGNOSIS — A419 Sepsis, unspecified organism: Secondary | ICD-10-CM | POA: Diagnosis not present

## 2015-11-22 LAB — CULTURE, BLOOD (ROUTINE X 2)

## 2015-11-22 LAB — GLUCOSE, CAPILLARY
GLUCOSE-CAPILLARY: 123 mg/dL — AB (ref 65–99)
Glucose-Capillary: 142 mg/dL — ABNORMAL HIGH (ref 65–99)
Glucose-Capillary: 117 mg/dL — ABNORMAL HIGH (ref 65–99)
Glucose-Capillary: 79 mg/dL (ref 65–99)
Glucose-Capillary: 70 mg/dL (ref 65–99)

## 2015-11-22 MED ORDER — FENTANYL CITRATE (PF) 100 MCG/2ML IJ SOLN
50.0000 ug | INTRAMUSCULAR | Status: DC
  Administered 2015-11-22 – 2015-11-23 (×4): 50 ug via INTRAVENOUS
  Filled 2015-11-22 (×4): qty 2

## 2015-11-22 MED ORDER — FENTANYL CITRATE (PF) 100 MCG/2ML IJ SOLN
50.0000 ug | INTRAMUSCULAR | Status: DC | PRN
Start: 2015-11-22 — End: 2015-11-24
  Administered 2015-11-22: 50 ug via INTRAVENOUS
  Filled 2015-11-22: qty 2

## 2015-11-22 MED ORDER — DOPAMINE-DEXTROSE 3.2-5 MG/ML-% IV SOLN
10.0000 ug/kg/min | INTRAVENOUS | Status: DC
  Administered 2015-11-22: 14 ug/kg/min via INTRAVENOUS
  Administered 2015-11-23: 20 ug/kg/min via INTRAVENOUS
  Administered 2015-11-23: 10 ug/kg/min via INTRAVENOUS
  Filled 2015-11-22 (×3): qty 250

## 2015-11-22 MED ORDER — AMPICILLIN-SULBACTAM SODIUM 1.5 (1-0.5) G IJ SOLR
1.5000 g | INTRAMUSCULAR | Status: DC
  Administered 2015-11-22 – 2015-11-23 (×2): 1.5 g via INTRAVENOUS
  Filled 2015-11-22 (×3): qty 1.5

## 2015-11-22 MED FILL — Medication: Qty: 1 | Status: AC

## 2015-11-22 NOTE — Progress Notes (Signed)
PULMONARY / CRITICAL CARE MEDICINE   Name: VERANIA ROYTMAN MRN: [place]:[id number] DOB: 03/31/1951    ADMISSION DATE:  11/06/2015  REFERRING MD:  EDP   CHIEF COMPLAINT:  Hypotension, renal failure, shock    SUBJECTIVE:  No change.  VITAL SIGNS: BP 106/59 mmHg  Pulse 54  Temp(Src) 97.4 F (36.3 C) (Oral)  Resp 11  Wt 282 lb 6.6 oz (128.1 kg)  SpO2 97%  VENTILATOR SETTINGS: Vent Mode:  [-] PRVC FiO2 (%):  [50 %] 50 % Set Rate:  [22 bmp] 22 bmp Vt Set:  [450 mL] 450 mL PEEP:  [10 cmH20] 10 cmH20 Plateau Pressure:  [27 [id number] cmH20] 27 cmH20  INTAKE / OUTPUT: I/O last 3 completed shifts: In: 5949.9 [I.V.:5094.9; Blood:335; Other:160; IV Piggyback:360] Out: 0   PHYSICAL EXAMINATION: General: critically ill appearing, foul smell Neuro: comatose HEENT: trach in place, roving eye movements Cardiac: bradycardic Chest: b/l crackles Abd: distended Ext: 3+ edema Skin: necrotic wounds on sacrum and legs  LABS:  BMET  Recent Labs Lab 11/09/2015 1135 11/23/2015 2226 11/20/15 0418  NA 116* 114* 116*  K 6.4* 6.4* 6.2*  CL 76* 74* 75*  CO2 20* 20* 22  BUN 293* 300* 289*  CREATININE 8.46* 8.53* 8.25*  GLUCOSE 78 116* 109*    Electrolytes  Recent Labs Lab 11/06/2015 1135 11/08/2015 2226 11/20/15 0418  CALCIUM 8.2* 8.0* 7.9*  MG 3.0*  --  2.8*  PHOS 9.1*  --  8.3*    CBC  Recent Labs Lab 11/26/2015 1135 11/10/2015 1525 11/20/15 0418  WBC 19.5* 26.2* 23.2*  HGB 5.6* 6.2* 5.8*  HCT 16.7* 18.3* 16.9*  PLT 107* 123* 100*    Coag's No results for input(s): APTT, INR in the last 168 hours.  Sepsis Markers  Recent Labs Lab 12/06/2015 0717 11/15/2015 1135  LATICACIDVEN 1.61 3.6*  PROCALCITON  --  19.48    ABG  Recent Labs Lab 11/14/2015 0732 12/03/2015 1112  PHART 7.127* 7.127*  PCO2ART 68.3* 62.8*  PO2ART 106.0* 42.0*    Liver Enzymes  Recent Labs Lab 11/07/2015 0710  AST 58*  ALT 85*  ALKPHOS 274*  BILITOT 0.5  ALBUMIN 1.2*    Cardiac  Enzymes  Recent Labs Lab 11/10/2015 1135  TROPONINI 0.42*    Glucose  Recent Labs Lab 11/21/15 0752 11/21/15 1159 11/21/15 1556 11/21/15 2010 11/22/15 0018 11/22/15 0439  GLUCAP 126* 136* 124* 134* 142* 123*    Imaging No results found.   STUDIES:   CULTURES: 5/14 GI panel >> Norovirus 5/14 Blood >> Acinetobacter baumannii  ANTIBIOTICS: 5/14 Vancomycin >> 5/16 5/14 Cefepime >> 5/16 5/16 Meropenem >>   SIGNIFICANT EVENTS: 5/14 Transfer from [place], nephrology consulted 5/15 Cardiac arrest 5/16 Seizures  LINES/TUBES: Trach (chronic)>>> R IJ CVL (EDP) 5/14>>> R pleur-x (chronic)>>>  L nephrostomy (Chronic)>>> PEG (chronic)>>>  ASSESSMENT: Acute on chronic respiratory failure with hypoxia S/p tracheostomy HCAP Chronic Rt pleural effusion s/p pleurx catheter Bradycardia Septic shock Hypovolemic/hemorrhagic shock Cardiac arrest 5/15 Hx of A fib ESRD >> no candidate for renal replacement therapy Hydronephrosis s/p Lt nephrostomy UTI Hyponatermia Hyperkalemia Dysphagia s/p PEG GI bleeding Acute blood loss anemia >> has rare antibodies limiting ability for transfusion Anemia of chronic disease and critical illness Sacral decubitus ulcer >> present prior to admission Norovirus gastroenteritis Acinetobacter bacteremia Comatose Functional quadriplegia Seizures  PLAN: Continue vent support >> use pressure support as primary mode of ventilation D/c epinephrine >> no escalation of pressors Continue antibiotics >> Day 4 Continue discussion with family  about goals of care Continue keppra, valproic acid to help control seizures Defer additional lab testing or imaging studies >> no benefit at this point  Chesley Mires, MD [place] 11/22/2015, 9:36 AM Pager:  [phone] After 3pm call: [phone]

## 2015-11-22 NOTE — Progress Notes (Signed)
Patient is too unstable to give bath with HR in 20s and 30s. Will continue to monitor and make as comfortable as possible. Joaquin Bend E, RN 11/22/2015 3:24 AM

## 2015-11-22 NOTE — Progress Notes (Signed)
Pt placed on PS 25/5 by MD.  RT will monitor

## 2015-11-22 NOTE — Progress Notes (Signed)
EPIC downtime did not transfer vital signs from 0030 to 0200. Joaquin Bend E, RN 11/22/2015 3:15 AM

## 2015-11-22 NOTE — Progress Notes (Signed)
After visualizing sacral wound and surrounding maceration, as well as speaking with bedside RN and patient's daughter Kenney Houseman, I will order low dose scheduled fentanyl q 4 hours.  Imogene Burn, [place] Palliative Medicine Pager: [phone]

## 2015-11-22 NOTE — Progress Notes (Signed)
Pt placed back on previous full support settings due to dec spo2 82%, MD aware.

## 2015-11-22 NOTE — Progress Notes (Signed)
Pharmacy Antibiotic Note  Jacqueline Fuentes is a 65 y.o. female admitted on 12/06/2015 with Acinetobacter bacteremia.  Pharmacy has been consulted for Unasyn dosing.  Susceptibilities resulted and Acinetobacter is S to Unasyn, Ceftazidime, Imipenem, Zosyn, and Bactrim.  After discussion with Dr. Halford Chessman of [facility], will stop meropenem as patient has been seizing and transition to Unasyn.  Plan: - After discussion with Dr. Halford Chessman, antibiotics will be changed to Unasyn 1.5 gm IV q24h.  - F/u LOT and palliative care discussions  Weight: 282 lb 6.6 oz (128.1 kg)  Temp (24hrs), Avg:97.3 F (36.3 C), Min:97 F (36.1 C), Max:97.4 F (36.3 C)   Recent Labs Lab 11/08/2015 0710 11/11/2015 0717 11/09/2015 0718 11/30/2015 1135 11/08/2015 1525 11/13/2015 2226 11/20/15 0418  WBC 5.8  --   --  19.5* 26.2*  --  23.2*  CREATININE 8.58*  --  8.40* 8.46*  --  8.53* 8.25*  LATICACIDVEN  --  1.61  --  3.6*  --   --   --     Estimated Creatinine Clearance: 9.6 mL/min (by C-G formula based on Cr of 8.25).    Allergies  Allergen Reactions  . Aspirin   . Heparin   . Lorazepam     Antimicrobials this admission: Cefepime 5/14 >> 5/16 Vancomycin 5/14 >> 5/16 Meropenem 5/16 >> 5/17 Unasyn 5/17 >>  Dose adjustments this admission: none  Microbiology results: 5/14 BCx: Acinetobacter (S unasyn, ceftaz, imipenem, zosyn, bactrim; R cipro, gent; I to ceftriaxone) 5/14 UCx: multiple species present  5/14 MRSA PCR: negative  Thank you for allowing pharmacy to be a part of this patient's care.  Cassie L. Nicole [facility], PharmD PGY2 Infectious Diseases Pharmacy Resident Pager: [phone] 11/22/2015 11:13 AM

## 2015-11-22 NOTE — Progress Notes (Addendum)
Daily Progress Note   Patient Name: Jacqueline Fuentes       Date: 11/22/2015 DOB: 09/19/1950  Age: 65 y.o. MRN#: [id number] Attending Physician: Rigoberto Noel, MD Primary Care Physician: Verneita Griffes, MD Admit Date: 11/20/2015  Reason for Consultation/Follow-up: Establishing goals of care, Interfamily conflict and Psychosocial/spiritual support  Subjective: Patient unable to speak.  Family in turmoil.  Daughters and brother in tears wanting the patient to die peacefully.  Husband speaking on at length about his wife's pain is like "Jesus going to the cross" he carried the burden of the cross for [place].  Family Demetrios Isaacs, [place], Herbie [place])  feels strongly that the patient is suffering and in pain.  They are insistent that she receive pain medications.  They ask if it would be possible to let the patient die peacefully without their father/brother in law knowing that she was allowed to die.  They desperately want to end her suffering.    Length of Stay: 3  Current Medications: Scheduled Meds:  . ampicillin-sulbactam (UNASYN) IV  1.5 g Intravenous Q24H  . antiseptic oral rinse  7 mL Mouth Rinse QID  . chlorhexidine gluconate (SAGE KIT)  15 mL Mouth Rinse BID  . levETIRAcetam  500 mg Intravenous Q12H  . pantoprazole (PROTONIX) IV  40 mg Intravenous Q24H  . valproic acid  125 mg Per Tube BID    Continuous Infusions: . sodium chloride    . sodium chloride 10 mL/hr at 11/22/15 1400  . DOPamine 16 mcg/kg/min (11/22/15 1400)  . sodium chloride 0.9 % 1,000 mL with sodium acetate 150 mEq infusion 10 mL/hr at 11/22/15 1400    PRN Meds: sodium chloride, sodium chloride, albuterol, calcium chloride, fentaNYL (SUBLIMAZE) injection, midazolam, sodium chloride flush  Physical Exam    Obese female, trach  on ventilator support, peg in place, does not speak, opens eyes slightly.  Tongue swollen with red blood. CV Brady cardic Resp NAD, trach with ventilator support Abd;  PEG in place.  Distended and firm Extremities:  3+ edema.        Skin:  Skin of extremities is now blistering (due to kidney failure and malnutrition).  Wounds not examined.   Vital Signs: BP 98/61 mmHg  Pulse 50  Temp(Src) 97 F (36.1 C) (Oral)  Resp 0  Wt 128.1 kg (282 lb  6.6 oz)  SpO2 97% SpO2: SpO2: 97 % O2 Device: O2 Device: Ventilator O2 Flow Rate:    Intake/output summary:   Intake/Output Summary (Last 24 hours) at 11/22/15 1502 Last data filed at 11/22/15 1400  Gross per 24 hour  Intake 3295.26 ml  Output      0 ml  Net 3295.26 ml   LBM: Last BM Date: 11/22/15 Baseline Weight: Weight: 97.5 kg (214 lb 15.2 oz) (from 4/4) Most recent weight: Weight: 128.1 kg (282 lb 6.6 oz)       Palliative Assessment/Data:    Flowsheet Rows        Most Recent Value   Intake Tab    Referral Department  Critical care   Unit at Time of Referral  ICU   Palliative Care Primary Diagnosis  Sepsis/Infectious Disease   Date Notified  11/27/2015   Palliative Care Type  Return patient Palliative Care   Reason for referral  Clarify Goals of Care   Date of Admission  11/07/2015   # of days IP prior to Palliative referral  0   Clinical Assessment    Palliative Performance Scale Score  10%   Psychosocial & Spiritual Assessment    Palliative Care Outcomes       Patient Active Problem List   Diagnosis Date Noted  . Palliative care encounter   . Goals of care, counseling/discussion   . End of life care   . Bradycardia 12/03/2015  . Osteomyelitis of lumbar spine ([place])   . Seizure disorder ([place])   . Anxiety state   . Depression   . Sacral decubitus ulcer   . Hypernatremia   . Chronic respiratory failure with hypoxia ([place])   . Ventilator dependent ([place])   . Essential hypertension   . Chronic atrial fibrillation ([place])    . Acute on chronic renal failure ([place])   . Protein-calorie malnutrition, severe ([place])   . Absolute anemia   . Acute blood loss anemia 09/21/2015  . Pressure ulcer 09/21/2015    Palliative Care Assessment & Plan   Patient Profile:  65 y.o. female with past medical history of endometrial cancer (??), CKD, bed bound status with trach and PEG, rt pleural effusion s/p pleurx cath, left hydronephrosis s/p nephrostomy, sacral decub. She was admitted on 11/20/2015 with bleeding from her PEG tube and was found to be in septic shock from multiple sources including UTI, Acinetobacter bacteremia, norovirus, and possibly wound infection. She has hyperkalemia and acute on chronic renal failure. Her hgb is 5.6. After admission she suffered a PE arrest and was coded. She is currently on two pressors and unresponsive.  She is making no urine.  She had 3 seizures overnight on 5/15 - 5/16. Her husband insists that she be A full code.   Assessment: Jacqueline Fuentes is suffering and likely in pain.  Her condition is so poor that I anticipate that she will not leave the ICU.    Recommendations/Plan:   At this point I believe that aggressive care to prolong life or attempt to increase function is futile,  and I recommend a shift in focus towards comfort.  Family requesting medications for pain and suffering.  Agree with PRN Versed and Fentanyl for seizures/anxiety and pain.      Code Status: Full code.    Prognosis:   Hours - Days  Discharge Planning:  Anticipated Hospital Death  Care plan was discussed with Hollice Espy.  PCCM attending MD.  Bedside RN  Thank you for  allowing the Palliative Medicine Team to assist in the care of this patient.   Time In: 1:00 Time Out: 1:35 Total Time 35 Prolonged Time Billed no      Greater than 50%  of this time was spent counseling and coordinating care related to the above assessment and plan.  Melton Alar, PA-C  Please contact Palliative  Medicine Team phone at [phone] for questions and concerns.

## 2015-11-23 DIAGNOSIS — J9621 Acute and chronic respiratory failure with hypoxia: Secondary | ICD-10-CM | POA: Diagnosis not present

## 2015-11-23 DIAGNOSIS — E875 Hyperkalemia: Secondary | ICD-10-CM | POA: Diagnosis not present

## 2015-11-23 DIAGNOSIS — A419 Sepsis, unspecified organism: Secondary | ICD-10-CM | POA: Diagnosis not present

## 2015-11-23 LAB — TYPE AND SCREEN
ABO/RH(D): O POS
ANTIBODY SCREEN: POSITIVE
DAT, IgG: NEGATIVE
UNIT DIVISION: 0
UNIT DIVISION: 0
Unit division: 0

## 2015-11-23 LAB — GLUCOSE, CAPILLARY
Glucose-Capillary: 63 mg/dL — ABNORMAL LOW (ref 65–99)
Glucose-Capillary: 71 mg/dL (ref 65–99)

## 2015-11-23 MED ORDER — DEXTROSE 50 % IV SOLN
25.0000 mL | Freq: Once | INTRAVENOUS | Status: AC
  Administered 2015-11-23: 25 mL via INTRAVENOUS
  Filled 2015-11-23: qty 50

## 2015-11-23 MED ORDER — DEXTROSE 50 % IV SOLN
INTRAVENOUS | Status: AC
  Filled 2015-11-23: qty 50

## 2015-11-23 MED ORDER — FENTANYL CITRATE (PF) 100 MCG/2ML IJ SOLN
50.0000 ug | INTRAMUSCULAR | Status: DC
  Administered 2015-11-24: 50 ug via INTRAVENOUS
  Filled 2015-11-23: qty 2

## 2015-11-23 NOTE — Progress Notes (Signed)
PULMONARY / CRITICAL CARE MEDICINE   Name: Jacqueline Fuentes MRN: [place]:[id number] DOB: May 19, 1951    ADMISSION DATE:  11/15/2015  REFERRING MD:  EDP   CHIEF COMPLAINT:  Hypotension, renal failure, shock    SUBJECTIVE:  No change.  VITAL SIGNS: BP 69/58 mmHg  Pulse 56  Temp(Src) 97 F (36.1 C) (Oral)  Resp 0  Wt 286 lb 9.6 oz (130 kg)  SpO2 100%  VENTILATOR SETTINGS: Vent Mode:  [-] PRVC FiO2 (%):  [50 %-60 %] 60 % Set Rate:  [22 bmp] 22 bmp Vt Set:  [450 mL] 450 mL PEEP:  [5 cmH20] 5 cmH20 Pressure Support:  [25 cmH20] 25 cmH20 Plateau Pressure:  [24 cmH20-33 cmH20] 27 cmH20  INTAKE / OUTPUT: I/O last 3 completed shifts: In: 3652.9 [I.V.:3097.9; Other:90; IV Piggyback:465] Out: 0   PHYSICAL EXAMINATION: General: critically ill appearing, foul smell Neuro: comatose HEENT: trach in place, roving eye movements Cardiac: bradycardic Chest: b/l crackles Abd: distended Ext: 3+ edema Skin: necrotic wounds on sacrum and legs  LABS:  BMET  Recent Labs Lab 11/10/2015 1135 11/30/2015 2226 11/20/15 0418  NA 116* 114* 116*  K 6.4* 6.4* 6.2*  CL 76* 74* 75*  CO2 20* 20* 22  BUN 293* 300* 289*  CREATININE 8.46* 8.53* 8.25*  GLUCOSE 78 116* 109*    Electrolytes  Recent Labs Lab 11/21/2015 1135 11/16/2015 2226 11/20/15 0418  CALCIUM 8.2* 8.0* 7.9*  MG 3.0*  --  2.8*  PHOS 9.1*  --  8.3*    CBC  Recent Labs Lab 11/14/2015 1135 11/20/2015 1525 11/20/15 0418  WBC 19.5* 26.2* 23.2*  HGB 5.6* 6.2* 5.8*  HCT 16.7* 18.3* 16.9*  PLT 107* 123* 100*    Coag's No results for input(s): APTT, INR in the last 168 hours.  Sepsis Markers  Recent Labs Lab 11/30/2015 0717 11/14/2015 1135  LATICACIDVEN 1.61 3.6*  PROCALCITON  --  19.48    ABG  Recent Labs Lab 11/12/2015 0732 11/13/2015 1112  PHART 7.127* 7.127*  PCO2ART 68.3* 62.8*  PO2ART 106.0* 42.0*    Liver Enzymes  Recent Labs Lab 11/18/2015 0710  AST 58*  ALT 85*  ALKPHOS 274*  BILITOT 0.5  ALBUMIN 1.2*     Cardiac Enzymes  Recent Labs Lab 11/29/2015 1135  TROPONINI 0.42*    Glucose  Recent Labs Lab 11/22/15 0439 11/22/15 1114 11/22/15 1520 11/22/15 2019 11/23/15 0011 11/23/15 0411  GLUCAP 123* 117* 79 70 63* 71    Imaging No results found.   STUDIES:   CULTURES: 5/14 GI panel >> Norovirus 5/14 Blood >> Acinetobacter baumannii  ANTIBIOTICS: 5/14 Vancomycin >> 5/16 5/14 Cefepime >> 5/16 5/16 Meropenem >>   SIGNIFICANT EVENTS: 5/14 Transfer from [place], nephrology consulted 5/15 Cardiac arrest 5/16 Seizures  LINES/TUBES: Trach (chronic)>>> R IJ CVL (EDP) 5/14>>> R pleur-x (chronic)>>>  L nephrostomy (Chronic)>>> PEG (chronic)>>>  ASSESSMENT: Acute on chronic respiratory failure with hypoxia S/p tracheostomy HCAP Chronic Rt pleural effusion s/p pleurx catheter Bradycardia Septic shock Hypovolemic/hemorrhagic shock Cardiac arrest 5/15 Hx of A fib ESRD >> no candidate for renal replacement therapy Hydronephrosis s/p Lt nephrostomy UTI Hyponatermia Hyperkalemia Dysphagia s/p PEG GI bleeding Acute blood loss anemia >> has rare antibodies limiting ability for transfusion Anemia of chronic disease and critical illness Sacral decubitus ulcer >> present prior to admission Norovirus gastroenteritis Acinetobacter bacteremia Comatose Functional quadriplegia Seizures  PLAN: Continue vent support Dopamine at 10 mcg >> will not increase Continue antibiotics >> Day 5 Continue discussions with family about  goals of care Continue keppra, valproic acid to help control seizures Defer additional lab testing or imaging studies >> no benefit at this point  Chesley Mires, MD [place] 11/23/2015, 8:14 AM Pager:  [phone] After 3pm call: [phone]

## 2015-11-23 NOTE — Care Management Note (Signed)
Case Management Note  Patient Details  Name: Jacqueline Fuentes MRN: [place]:[id number] Date of Birth: 12-06-1950  Subjective/Objective:      Pt admitted with bradycardia              Action/Plan:  Pt is from [facility] - chronic vent/trach with large wounds.  Palliative care is involved however pt remains full code.  CM will continue to follow for discharge needs - CSW consulted.   Expected Discharge Date:                  Expected Discharge Plan:  Skilled Nursing Facility  In-House Referral:  Clinical Social Work  Discharge planning Services     Post Acute Care Choice:    Choice offered to:     DME Arranged:    DME Agency:     HH Arranged:    [place] Agency:     Status of Service:  In process, will continue to follow  Medicare Important Message Given:    Date Medicare IM Given:    Medicare IM give by:    Date Additional Medicare IM Given:    Additional Medicare Important Message give by:     If discussed at [place] of Stay Meetings, dates discussed:    Additional Comments:  Maryclare Labrador, RN 11/23/2015, 7:57 AM

## 2015-11-23 NOTE — Progress Notes (Signed)
Dr. Halford Chessman and palliative care team aware of low bp despite pressors.  Will continue to monitor very closely and follow physicians orders. Family not at bedside at this time.

## 2015-11-24 DIAGNOSIS — J9622 Acute and chronic respiratory failure with hypercapnia: Secondary | ICD-10-CM | POA: Diagnosis not present

## 2015-11-24 DIAGNOSIS — G9340 Encephalopathy, unspecified: Secondary | ICD-10-CM

## 2015-11-24 DIAGNOSIS — J9621 Acute and chronic respiratory failure with hypoxia: Secondary | ICD-10-CM | POA: Diagnosis not present

## 2015-11-24 DIAGNOSIS — A419 Sepsis, unspecified organism: Secondary | ICD-10-CM | POA: Diagnosis not present

## 2015-11-24 MED ORDER — SODIUM CHLORIDE 0.9 % IV SOLN
3.0000 g | INTRAVENOUS | Status: DC
  Filled 2015-11-24: qty 3

## 2015-11-26 LAB — CULTURE, BLOOD (ROUTINE X 2)

## 2015-11-30 ENCOUNTER — Telehealth: Payer: Self-pay

## 2015-11-30 NOTE — Telephone Encounter (Signed)
On 11/30/2015 I received a death certificate from [facility] (original). The death certificate is for Unknown. The patient is a patient of Doctor Sood. The death certificate will be taken to Pulmonary Unit at [facility] tomorrow am for signature. On 12-18-2015 I received the death certificate back from Doctor [place]. I got the death certificate and called the funeral home to let them know the death certificate is ready for pickup.

## 2015-12-07 NOTE — Discharge Summary (Signed)
Jacqueline Fuentes was a 65 y.o. female admitted on 11/18/2015 from [place] with drainage from PEG tube.  She was bradycardic, hypothermic, and hypotensive.  She has hx of CVA, quadriplegia, seizures, s/p trach with chronic vent dependence, and unstageable decubitus ulcers.  She was started on broad spectrum antibiotics.  Nephrology was consulted.  She developed PEA cardiac arrest on 11/20/15.  Her clinical status was much worse.  She was started on pressors agents.  She was deemed to be an unsuitable candidate for renal replacement therapy.  She was started on therapy for HCAP and found to have bacteremia.  Wound care was consulted.  She was seen by palliative care.  She had seizure episode on 11/20/15 and AEDs were adjusted.  She continued to have decline in her medical status.  After several conversations her husband accepted that she could not survive this illness, and agreed to DNR status.  She subsequently expired on 2015-12-19 at 1536.  Final Diagnoses: Acute on chronic respiratory failure with hypoxia S/p tracheostomy HCAP Chronic Rt pleural effusion s/p pleurx catheter Bradycardia Septic shock Hypovolemic/hemorrhagic shock Cardiac arrest 5/15 Hx of A fib ESRD Hydronephrosis s/p Lt nephrostomy UTI Hyponatermia Hyperkalemia Dysphagia s/p PEG GI bleeding Acute blood loss anemia >> has rare antibodies limiting ability for transfusion Anemia of chronic disease and critical illness Sacral decubitus ulcer >> present prior to admission Norovirus gastroenteritis Acinetobacter bacteremia Comatose Functional quadriplegia Seizures   Chesley Mires, MD [place] December 19, 2015, 3:40 PM

## 2015-12-07 NOTE — Progress Notes (Signed)
Met with pt's husband, daughter, and brother.  Explained that pt is in dying process and nothing more to offer to prevent this.  Her husband has accepted that "when her heart stops, just let her go."  I have changed her to DNR status.  Chesley Mires, MD [facility] Pulmonary/Critical Care 12/03/15, 3:02 PM Pager:  [phone] After 3pm call: [phone]

## 2015-12-07 NOTE — Progress Notes (Signed)
Asystole on monitor.  No heart sounds.  Patient pronounced dead at 1536 on 12/18/15.  Informed patient husband and daughter who were at bedside.  Chesley Mires, MD [facility] Pulmonary/Critical Care 18-Dec-2015, 3:31 PM Pager:  [phone] After 3pm call: [phone]

## 2015-12-07 NOTE — Progress Notes (Signed)
Called pts husband and informed him that his wife has had a change in her condition for the worst. Mr. Alhassan stated he would be coming to the hospital.  Mable Fill to be paged and made aware for family support when they arrive.

## 2015-12-07 NOTE — Progress Notes (Signed)
PULMONARY / CRITICAL CARE MEDICINE   Name: Jacqueline Fuentes MRN: [id number] DOB: [birth year]    ADMISSION DATE:  11/12/2015  REFERRING MD:  EDP   CHIEF COMPLAINT:  Hypotension, renal failure, shock    SUBJECTIVE:  She has flies in her room, and worse smell.  VITAL SIGNS: BP 72/46 mmHg  Pulse 64  Temp(Src) 98 F (36.7 C) (Oral)  Resp 0  Wt 288 lb 9.3 oz (130.9 kg)  SpO2 93%  VENTILATOR SETTINGS: Vent Mode:  [-] PRVC FiO2 (%):  [50 %] 50 % Set Rate:  [22 bmp] 22 bmp Vt Set:  [450 mL] 450 mL PEEP:  [5 cmH20] 5 cmH20 Plateau Pressure:  [16 cmH20-31 cmH20] 27 cmH20  INTAKE / OUTPUT: I/O last 3 completed shifts: In: 1860.4 [I.V.:1445.4; IV Piggyback:415] Out: 20 [Drains:20]  PHYSICAL EXAMINATION: General: critically ill appearing, foul smell Neuro: comatose HEENT: trach in place, roving eye movements Cardiac: bradycardic Chest: b/l crackles Abd: distended Ext: 3+ edema Skin: necrotic wounds on sacrum and legs  LABS:  BMET  Recent Labs Lab 11/30/2015 1135 11/23/2015 2226 11/20/15 0418  NA 116* 114* 116*  K 6.4* 6.4* 6.2*  CL 76* 74* 75*  CO2 20* 20* 22  BUN 293* 300* 289*  CREATININE 8.46* 8.53* 8.25*  GLUCOSE 78 116* 109*    Electrolytes  Recent Labs Lab 11/23/2015 1135 11/09/2015 2226 11/20/15 0418  CALCIUM 8.2* 8.0* 7.9*  MG 3.0*  --  2.8*  PHOS 9.1*  --  8.3*    CBC  Recent Labs Lab 12/02/2015 1135 11/25/2015 1525 11/20/15 0418  WBC 19.5* 26.2* 23.2*  HGB 5.6* 6.2* 5.8*  HCT 16.7* 18.3* 16.9*  PLT 107* 123* 100*    Coag's No results for input(s): APTT, INR in the last 168 hours.  Sepsis Markers  Recent Labs Lab 12/02/2015 0717 11/12/2015 1135  LATICACIDVEN 1.61 3.6*  PROCALCITON  --  19.48    ABG  Recent Labs Lab 11/13/2015 0732 11/17/2015 1112  PHART 7.127* 7.127*  PCO2ART 68.3* 62.8*  PO2ART 106.0* 42.0*    Liver Enzymes  Recent Labs Lab 11/22/2015 0710  AST 58*  ALT 85*  ALKPHOS 274*  BILITOT 0.5  ALBUMIN 1.2*     Cardiac Enzymes  Recent Labs Lab 11/22/2015 1135  TROPONINI 0.42*    Glucose  Recent Labs Lab 11/22/15 0439 11/22/15 1114 11/22/15 1520 11/22/15 2019 11/23/15 0011 11/23/15 0411  GLUCAP 123* 117* 79 70 63* 71    Imaging No results found.   STUDIES:   CULTURES: 5/14 GI panel >> Norovirus 5/14 Blood >> Acinetobacter baumannii  ANTIBIOTICS: 5/14 Vancomycin >> 5/16 5/14 Cefepime >> 5/16 5/16 Meropenem >> 5/17 5/17 Unasyn >> 5/19  SIGNIFICANT EVENTS: 5/14 Transfer from [place], nephrology consulted 5/15 Cardiac arrest 5/16 Seizures  LINES/TUBES: Trach (chronic)>>> R IJ CVL (EDP) 5/14>>> R pleur-x (chronic)>>>  L nephrostomy (Chronic)>>> PEG (chronic)>>>  ASSESSMENT: Acute on chronic respiratory failure with hypoxia S/p tracheostomy HCAP Chronic Rt pleural effusion s/p pleurx catheter Bradycardia Septic shock Hypovolemic/hemorrhagic shock Cardiac arrest 5/15 Hx of A fib ESRD >> no candidate for renal replacement therapy Hydronephrosis s/p Lt nephrostomy UTI Hyponatermia Hyperkalemia Dysphagia s/p PEG GI bleeding Acute blood loss anemia >> has rare antibodies limiting ability for transfusion Anemia of chronic disease and critical illness Sacral decubitus ulcer >> present prior to admission Norovirus gastroenteritis Acinetobacter bacteremia Comatose Functional quadriplegia Seizures  PLAN: Change FiO2 to 35% and will not increase further D/c pressors, antibiotics >> medically futile therapies Continue keppra, valproic  acid to help control seizures Defer additional lab testing or imaging studies >> no benefit at this point  Will update family when they return.  Any additional interventions in my opinion are medically futile.  Chesley Mires, MD [facility] Pulmonary/Critical Care 10-Dec-2015, 12:34 PM Pager:  [phone] After 3pm call: [phone]

## 2015-12-07 NOTE — Progress Notes (Signed)
Pharmacy Antibiotic Note  Jacqueline Fuentes is a 65 y.o. female admitted on 11/06/2015 with Acinetobacter bacteremia.  Pharmacy has been consulted for Unasyn dosing.  Given the patient's severity of illness - will maximize Unasyn for the patient's renal function at 3g/24h. Will continue to follow family and CCM plans for possible comfort care.   Plan: - Adjust Unasyn to 3g IV every 24 hours - F/u LOT and palliative care discussions  Weight: 288 lb 9.3 oz (130.9 kg)  No data recorded.   Recent Labs Lab 11/28/2015 0710 11/17/2015 0717 11/18/2015 0718 11/16/2015 1135 11/12/2015 1525 11/30/2015 2226 11/20/15 0418  WBC 5.8  --   --  19.5* 26.2*  --  23.2*  CREATININE 8.58*  --  8.40* 8.46*  --  8.53* 8.25*  LATICACIDVEN  --  1.61  --  3.6*  --   --   --     Estimated Creatinine Clearance: 9.7 mL/min (by C-G formula based on Cr of 8.25).    Allergies  Allergen Reactions  . Aspirin   . Heparin   . Lorazepam     Antimicrobials this admission: Cefepime 5/14 >> 5/16 Vancomycin 5/14 >> 5/16 Meropenem 5/16 >> 5/17 Unasyn 5/17 >>  Dose adjustments this admission: none  Microbiology results: 5/14 BCx: Acinetobacter (S unasyn, ceftaz, imipenem, zosyn, bactrim; R cipro, gent; I to ceftriaxone) 5/14 UCx: multiple species present  5/14 MRSA PCR: negative  Thank you for allowing pharmacy to be a part of this patient's care.  Alycia Rossetti, PharmD, BCPS Clinical Pharmacist Pager: [phone] December 21, 2015 11:19 AM

## 2015-12-07 NOTE — Progress Notes (Signed)
   12-09-15 1800  Clinical Encounter Type  Visited With Patient and family together  Visit Type Spiritual support  Referral From Nurse  Spiritual Encounters  Spiritual Needs Grief support  Stress Factors  Family Stress Factors Loss  Patient actively dying. Stayed with family through death, offered prayer and emotional support. Cared for small boy with family while they grieved. Delonda Coley, Chaplain

## 2015-12-07 DEATH — deceased

## 2016-02-02 NOTE — Progress Notes (Signed)
PT Hydrotherapy Addendum Late entry for 07-Oct-2015 for g-codes    10/07/15 1118  PT G-Codes **NOT FOR INPATIENT CLASS**  Functional Assessment Tool Used clinical judgement/chart review  Functional Limitation Other PT primary  Other PT Primary Current Status [id number]) CK  Other PT Primary Goal Status [id number]) CJ        02/02/2016 [name], [place]

## 2016-02-13 NOTE — Progress Notes (Signed)
PT reevaluation addendum Late entry for Nov 08, 2015 G-codes   Nov 08, 2015 1111  PT G-Codes **NOT FOR INPATIENT CLASS**  Functional Assessment Tool Used clinical judgement/chart review  Functional Limitation Other PT primary  Other PT Primary Current Status [id number]) CL  Other PT Primary Goal Status [id number]) CJ  PT Evaluation  $PT Re-evaluation 1 Procedure  02/13/2016 [name], PT [phone]

## 2016-04-03 IMAGING — US IR US GUIDE VASC ACCESS RIGHT
1 series · 1 of 1 positions shown · non-contrast
Comparison: none

INDICATION: Poor venous access.  Request PICC line placement.

[Series 1: ir fluoro/shunt/fist · 1 of 1 slices shown]
[im 1/1]
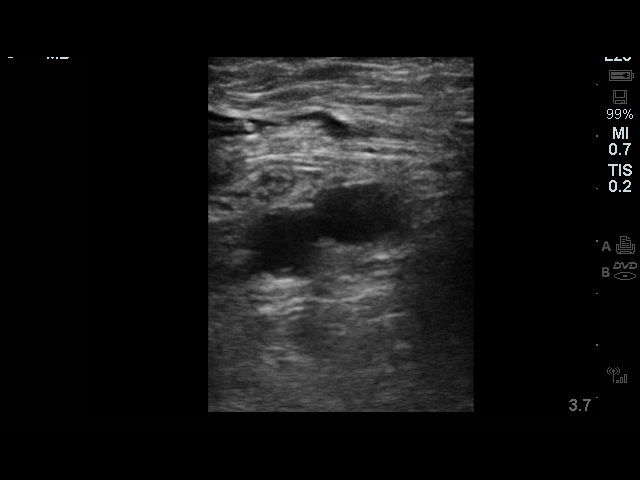

[1 of 1 positions shown; findings below may reference images not displayed]

EXAM:
RIGHT UPPER EXTREMITY PICC LINE PLACEMENT WITH ULTRASOUND AND
FLUOROSCOPIC GUIDANCE

MEDICATIONS:
None; The antibiotic was administered within an appropriate time
interval prior to skin puncture.

ANESTHESIA/SEDATION:
Moderate Sedation Time: None The patient's level of consciousness
and vital signs were monitored continuously by radiology nursing
throughout the procedure under my direct supervision.

FLUOROSCOPY TIME:  Fluoroscopy Time: 12 seconds

COMPLICATIONS:
None immediate.

PROCEDURE:
The patient was advised of the possible risks and complications and
agreed to undergo the procedure. The patient was then brought to the
angiographic suite for the procedure.

The right arm was prepped with chlorhexidine, draped in the usual
sterile fashion using maximum barrier technique (cap and mask,
sterile gown, sterile gloves, large sterile sheet, hand hygiene and
cutaneous antisepsis) and infiltrated locally with 1% Lidocaine.

Ultrasound demonstrated patency of the right brachial vein, and this
was documented with an image. Under real-time ultrasound guidance,
this vein was accessed with a 21 gauge micropuncture needle and
image documentation was performed. A [DATE] wire was introduced in to
the vein. Over this, a 5 French dual lumen power injectable PICC was
advanced to the lower SVC/right atrial junction. Fluoroscopy during
the procedure and fluoro spot radiograph confirms appropriate
catheter position. The catheter was flushed and covered with a
sterile dressing.

Catheter length: 37 cm
IMPRESSION: Successful right arm power PICC line placement with ultrasound and
fluoroscopic guidance. The catheter is ready for use.

## 2016-07-12 IMAGING — CR DG CHEST 1V PORT
1 series · 1 of 1 positions shown · non-contrast
Comparison: 09/28/2015

CLINICAL DATA: Central line placement.

EXAM:
PORTABLE CHEST 1 VIEW

[AP]
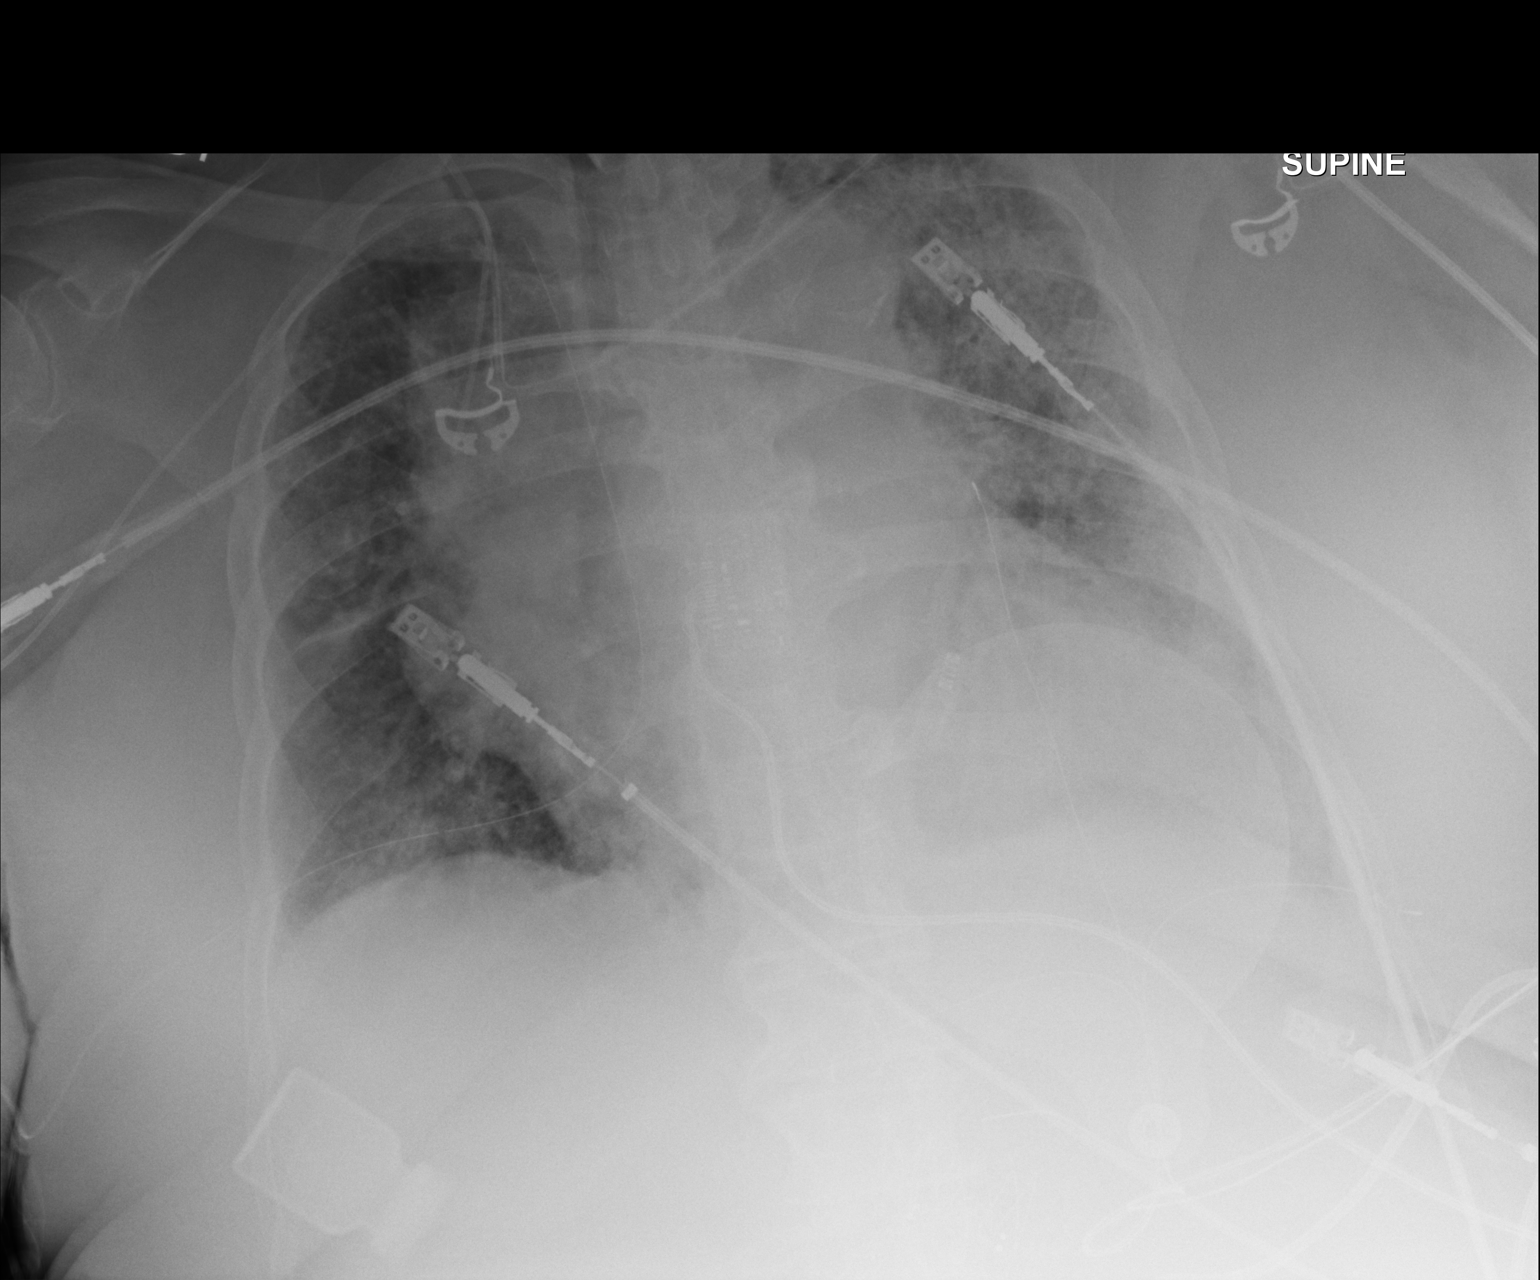

[1 of 1 positions shown; findings below may reference images not displayed]

FINDINGS: Patient slightly rotated to the right. Tracheostomy tube and small
caliber right sided pleural catheter unchanged. Metallic device with
associated catheter projected over the mid thoracic spine. Interval
placement of right IJ central venous catheter with tip overlying the
SVC.

Lungs are adequately inflated demonstrate persistent airspace
opacification of the left mid upper lung without significant change.
Mild prominence of the perihilar vasculature unchanged. Minimal
right base opacification slightly worse. No definite effusion. No
right-sided pneumothorax. Cardiomediastinal silhouette is prominent
but unchanged. Stable widened mediastinum due in part to patient's
known ascending aortic aneurysm and dissection. Remainder the exam
is unchanged.
IMPRESSION: Persistent opacification over the left mid to upper lung which may
be due to infection. Slight worsening right base opacification which
may be due to atelectasis or infection.

Cardiomegaly and suggestion of mild vascular congestion.

Stable widened mediastinum due in part to known ascending aortic
aneurysm/ dissection.

Tubes and lines as described.

## 2016-07-13 IMAGING — CR DG CHEST 1V PORT
1 series · 1 of 1 positions shown · non-contrast
Comparison: 11/19/2015

CLINICAL DATA: Cardiopulmonary arrest with successful
resuscitation.

EXAM:
PORTABLE CHEST 1 VIEW

[AP]
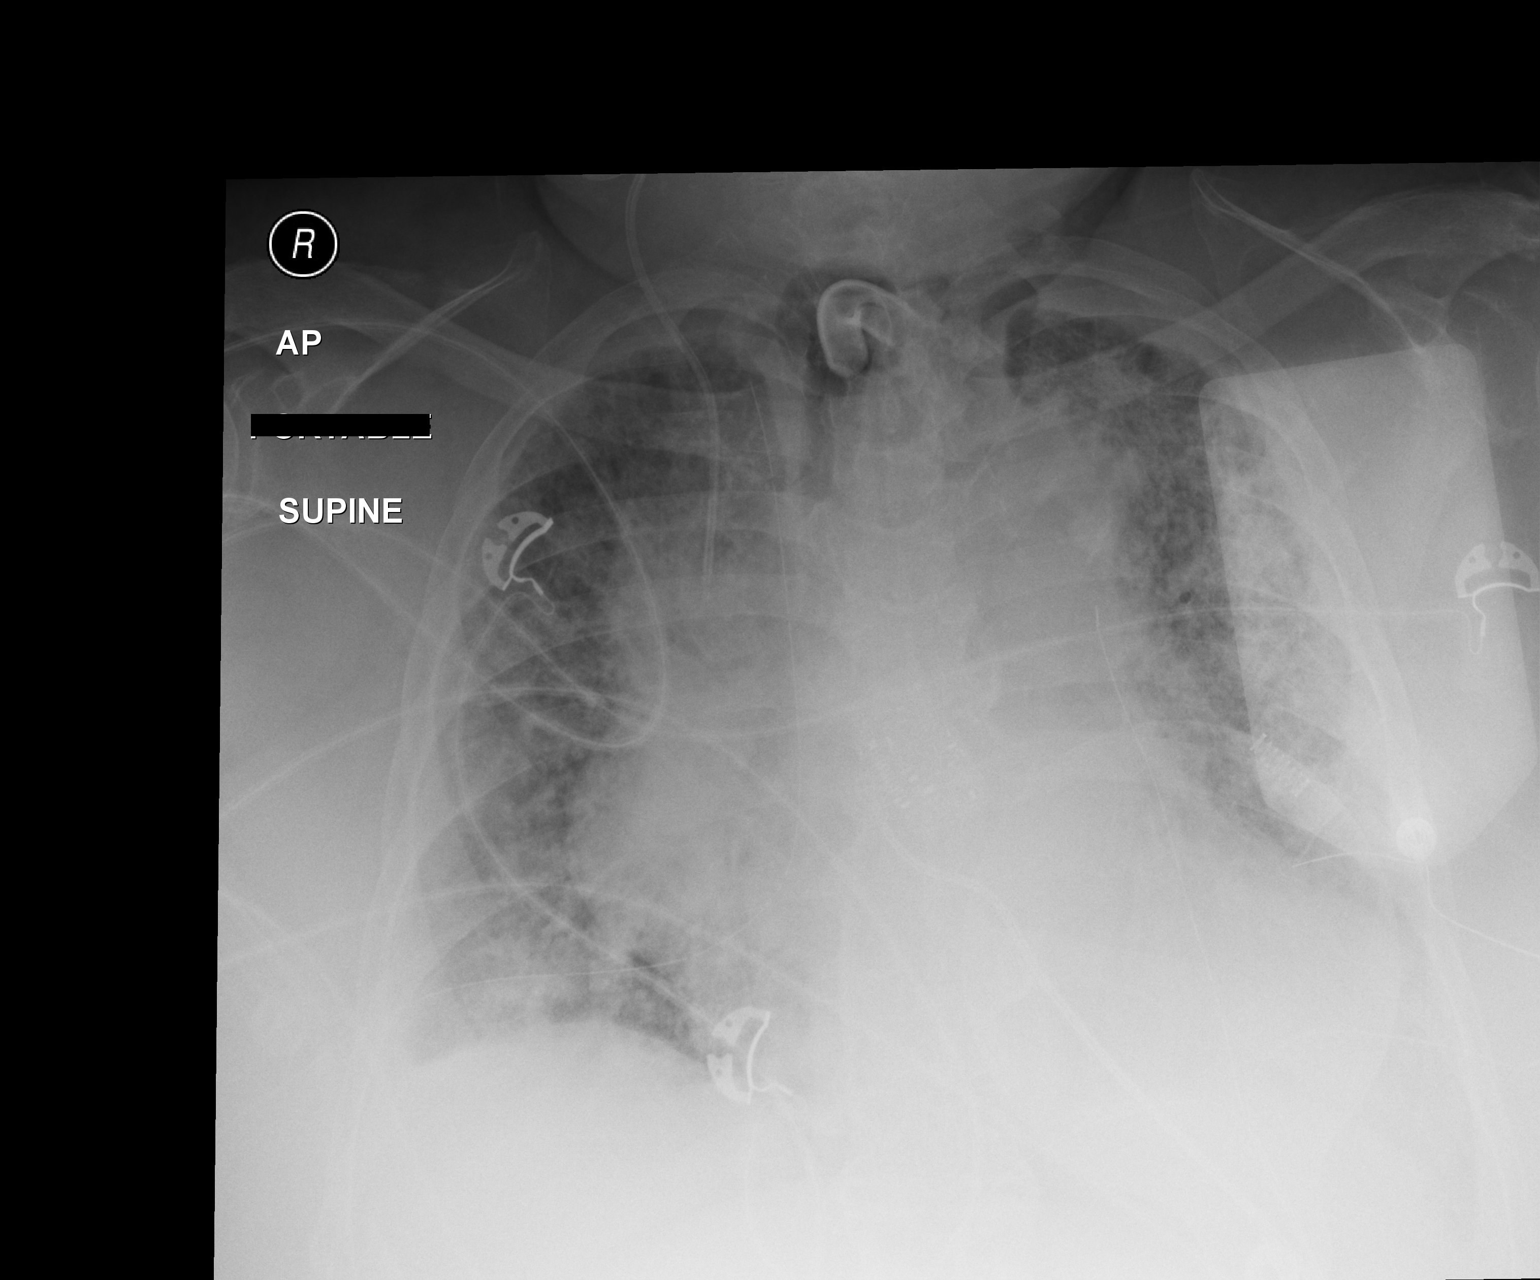

[1 of 1 positions shown; findings below may reference images not displayed]

FINDINGS: Tracheostomy. Right central venous catheter overlies the mid SVC
region. There appears to be a left chest tube in place. Multiple
superimposed leads and wires. Shallow inspiration. Heart size is
increased. Mediastinal shadows are increased, without change since
prior study, and corresponding to known aortic aneurysm. Blunting of
the costophrenic angles likely indicates small effusions. Bilateral
airspace infiltrates may indicate edema or infiltration due to
pneumonia. Aspiration could also have this appearance. Appearances
are similar to previous study. No pneumothorax.
IMPRESSION: Appliances are unchanged in position. Cardiac enlargement. Bilateral
airspace disease. Small bilateral pleural effusions. Prominence of
the right mediastinum and hilar shadows corresponding to known
aortic aneurysm.
